# Patient Record
Sex: Female | Born: 1955 | Race: White | Hispanic: No | Smoking: Never smoker
Health system: Southern US, Community
[De-identification: ages and names within clinical notes are randomized; demographics above are authoritative.]

## PROBLEM LIST (undated history)

## (undated) DIAGNOSIS — G589 Mononeuropathy, unspecified: Secondary | ICD-10-CM

## (undated) DIAGNOSIS — K469 Unspecified abdominal hernia without obstruction or gangrene: Secondary | ICD-10-CM

## (undated) DIAGNOSIS — I10 Essential (primary) hypertension: Secondary | ICD-10-CM

## (undated) DIAGNOSIS — K219 Gastro-esophageal reflux disease without esophagitis: Secondary | ICD-10-CM

## (undated) DIAGNOSIS — M1990 Unspecified osteoarthritis, unspecified site: Secondary | ICD-10-CM

## (undated) HISTORY — PX: CHOLECYSTECTOMY: SHX55

## (undated) HISTORY — DX: Mononeuropathy, unspecified: G58.9

---

## 2000-04-01 ENCOUNTER — Encounter: Payer: Self-pay | Admitting: Internal Medicine

## 2000-04-01 ENCOUNTER — Ambulatory Visit (HOSPITAL_COMMUNITY): Admission: RE | Admit: 2000-04-01 | Discharge: 2000-04-01 | Payer: Self-pay | Admitting: Internal Medicine

## 2000-05-25 ENCOUNTER — Other Ambulatory Visit: Admission: RE | Admit: 2000-05-25 | Discharge: 2000-05-25 | Payer: Self-pay | Admitting: Obstetrics and Gynecology

## 2001-06-16 ENCOUNTER — Other Ambulatory Visit: Admission: RE | Admit: 2001-06-16 | Discharge: 2001-06-16 | Payer: Self-pay | Admitting: Obstetrics and Gynecology

## 2002-07-04 ENCOUNTER — Other Ambulatory Visit: Admission: RE | Admit: 2002-07-04 | Discharge: 2002-07-04 | Payer: Self-pay | Admitting: Obstetrics and Gynecology

## 2002-10-01 ENCOUNTER — Encounter: Payer: Self-pay | Admitting: Internal Medicine

## 2002-10-01 ENCOUNTER — Ambulatory Visit (HOSPITAL_COMMUNITY): Admission: RE | Admit: 2002-10-01 | Discharge: 2002-10-01 | Payer: Self-pay | Admitting: Internal Medicine

## 2003-07-17 ENCOUNTER — Other Ambulatory Visit: Admission: RE | Admit: 2003-07-17 | Discharge: 2003-07-17 | Payer: Self-pay | Admitting: Obstetrics and Gynecology

## 2004-07-29 ENCOUNTER — Other Ambulatory Visit: Admission: RE | Admit: 2004-07-29 | Discharge: 2004-07-29 | Payer: Self-pay | Admitting: Obstetrics and Gynecology

## 2005-08-10 ENCOUNTER — Other Ambulatory Visit: Admission: RE | Admit: 2005-08-10 | Discharge: 2005-08-10 | Payer: Self-pay | Admitting: Obstetrics and Gynecology

## 2006-07-06 ENCOUNTER — Ambulatory Visit: Payer: Self-pay | Admitting: Gastroenterology

## 2006-07-19 ENCOUNTER — Ambulatory Visit: Payer: Self-pay | Admitting: Gastroenterology

## 2006-07-19 ENCOUNTER — Encounter (INDEPENDENT_AMBULATORY_CARE_PROVIDER_SITE_OTHER): Payer: Self-pay | Admitting: Gastroenterology

## 2007-08-18 ENCOUNTER — Ambulatory Visit (HOSPITAL_COMMUNITY): Admission: RE | Admit: 2007-08-18 | Discharge: 2007-08-18 | Payer: Self-pay | Admitting: Emergency Medicine

## 2007-08-18 ENCOUNTER — Emergency Department (HOSPITAL_COMMUNITY): Admission: EM | Admit: 2007-08-18 | Discharge: 2007-08-18 | Payer: Self-pay | Admitting: Emergency Medicine

## 2007-11-17 ENCOUNTER — Encounter: Admission: RE | Admit: 2007-11-17 | Discharge: 2007-11-17 | Payer: Self-pay | Admitting: General Surgery

## 2009-04-19 IMAGING — US US ABDOMEN COMPLETE
1 series · 14 of 25 positions shown · non-contrast
Comparison: None available

CLINICAL DATA: Abdominal pain

ABDOMEN ULTRASOUND
TECHNIQUE: Complete abdominal ultrasound examination was performed
including evaluation of the liver, gallbladder, bile ducts,
pancreas, kidneys, spleen, IVC, and abdominal aorta.

[Series 1: unknown · 0.40mm/px · 14 of 99 slices shown]
[im 1/99]
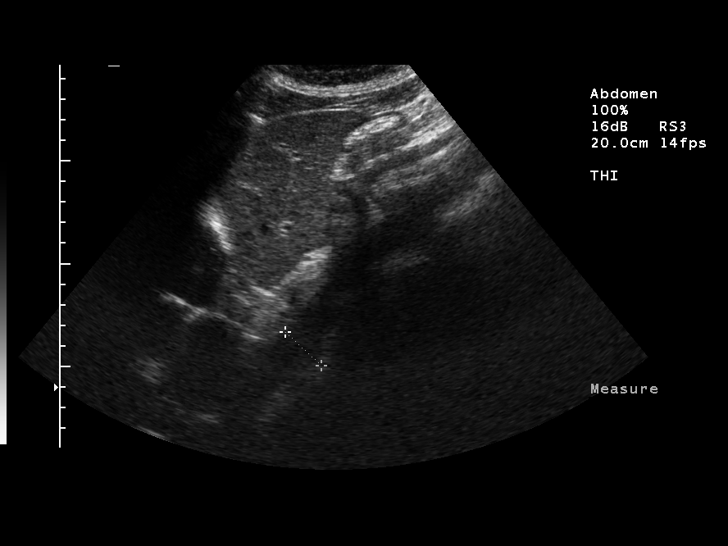
[im 9/99]
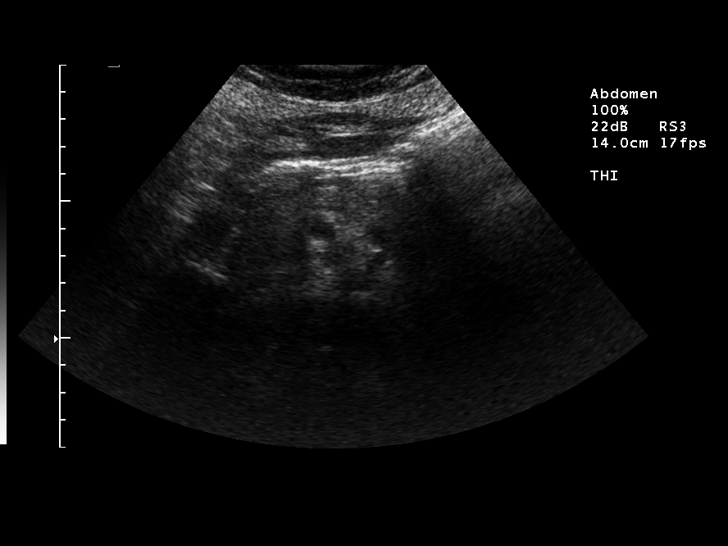
[im 17/99]
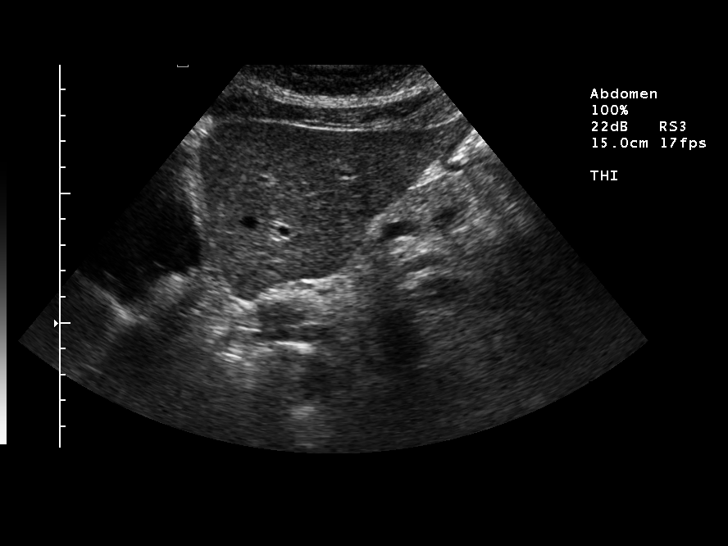
[im 25/99]
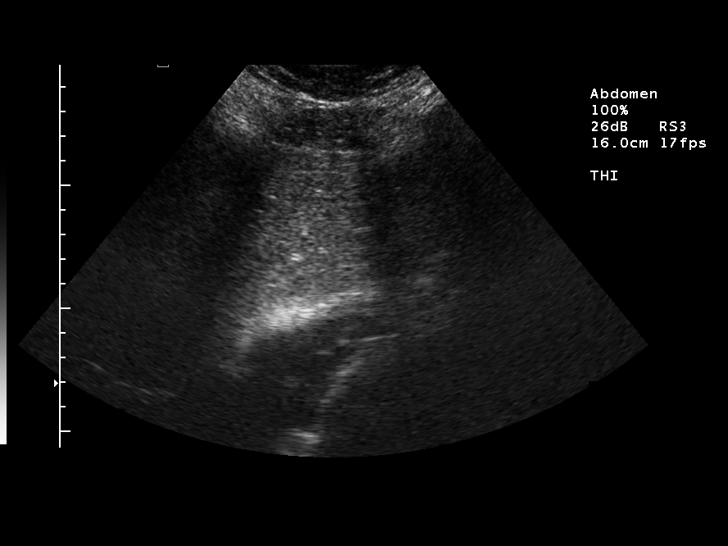
[im 33/99]
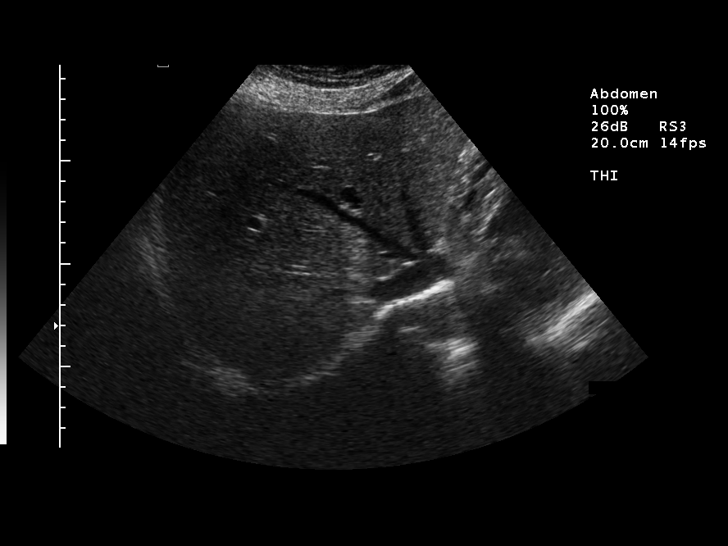
[im 37/99]
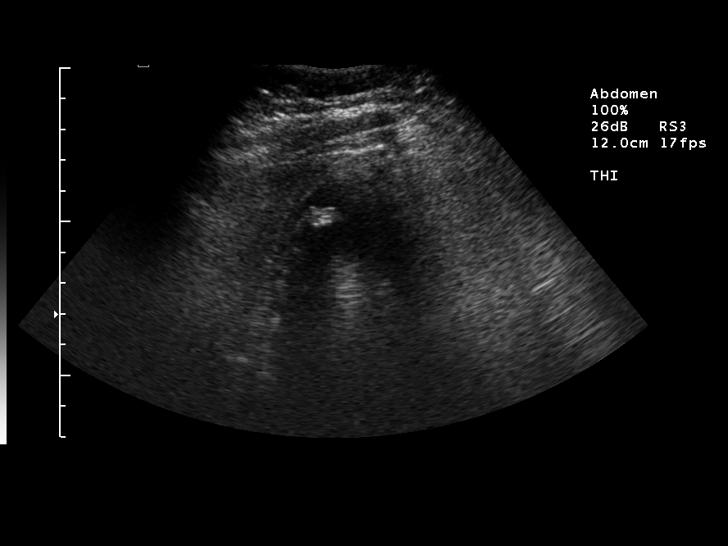
[im 45/99]
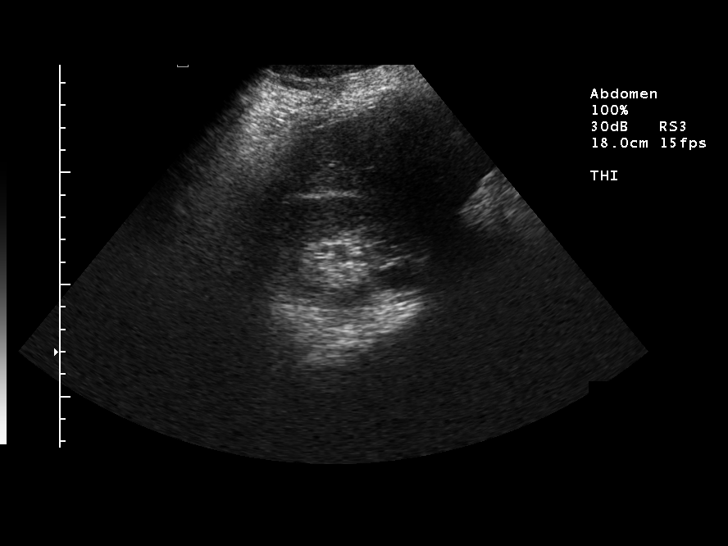
[im 54/99]
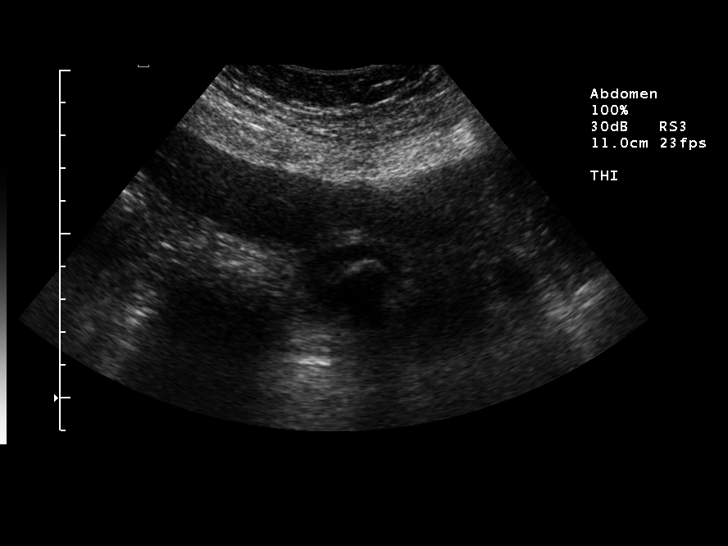
[im 62/99]
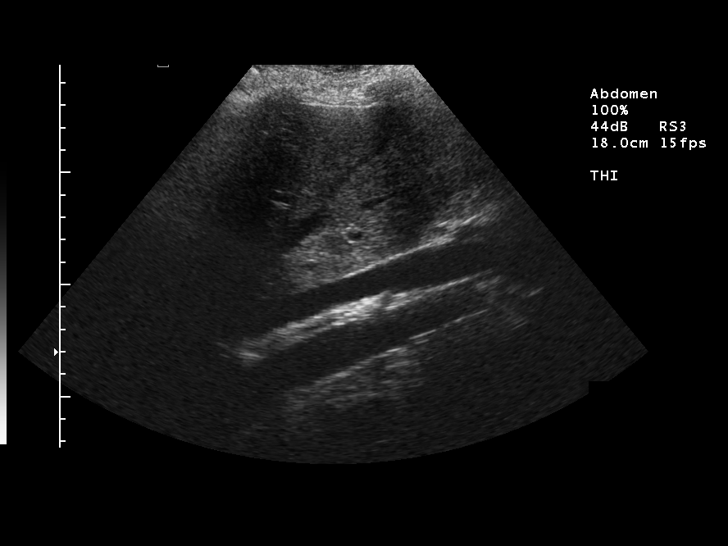
[im 66/99]
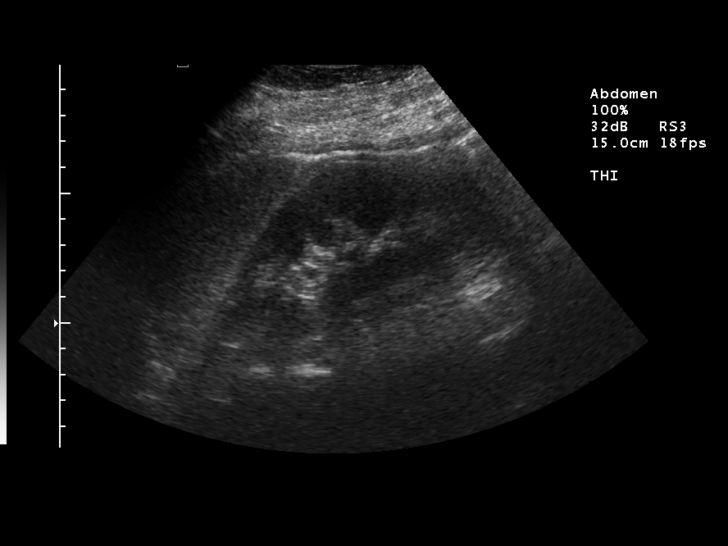
[im 74/99]
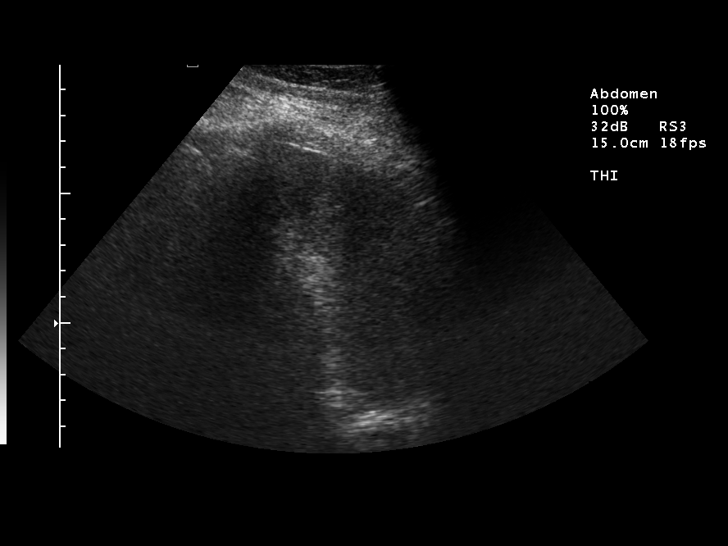
[im 82/99]
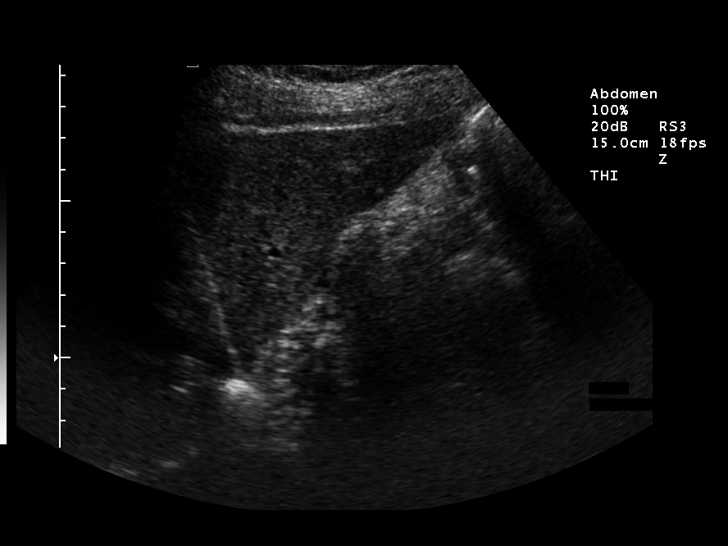
[im 90/99]
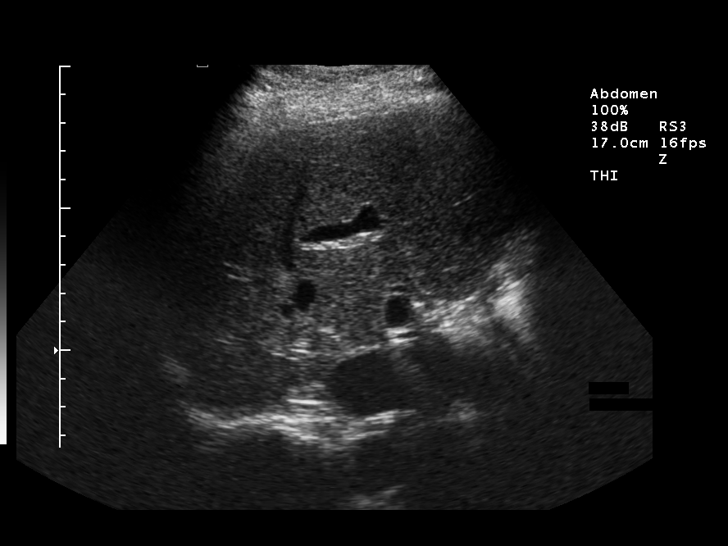
[im 99/99]
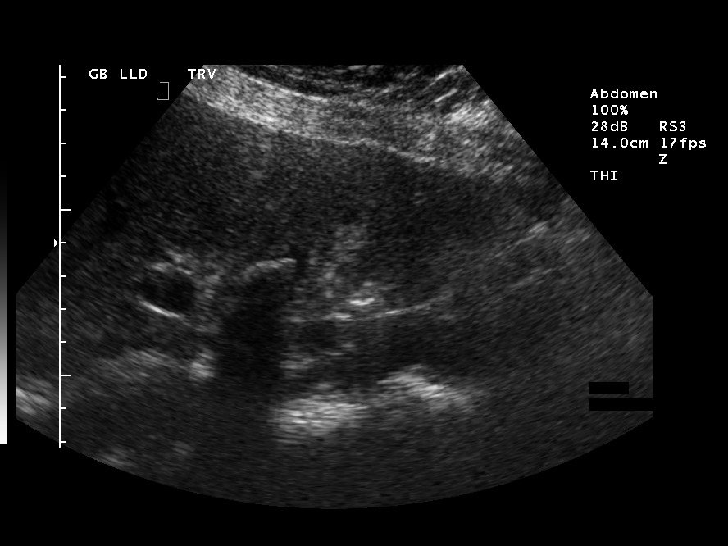

[14 of 25 positions shown; findings below may reference images not displayed]

FINDINGS: Gallbladder:  At least 2 large gallstones in the lumen of the
gallbladder.  Gallbladder wall is thickened to 6 mm.  No definite
pericholecystic fluid.  Negative sonographic Murphy's sign per the
ultrasound technologist.

Common bile duct: Normal in caliber.

Liver:  Normal size and echotexture.  No focal parenchymal
abnormalities.

Inferior vena cava:  Patent.

Pancreas:  Visualized portions unremarkable.

Spleen:  Normal size and echotexture without focal parenchymal
abnormalities.

Right kidney:  No hydronephrosis.   Normal parenchymal echotexture
without focal abnormalities.

Left kidney:  No hydronephrosis. Normal parenchymal echotexture
without focal abnormalities.

Abdominal aorta:  Visualized portions normal in caliber,
unremarkable..
IMPRESSION: 1.  Cholelithiasis with gallbladder wall thickening.

## 2009-10-28 ENCOUNTER — Encounter: Admission: RE | Admit: 2009-10-28 | Discharge: 2010-01-22 | Payer: Self-pay | Admitting: Orthopedic Surgery

## 2009-12-05 ENCOUNTER — Encounter: Admission: RE | Admit: 2009-12-05 | Discharge: 2009-12-05 | Payer: Self-pay | Admitting: Family Medicine

## 2009-12-17 ENCOUNTER — Encounter: Admission: RE | Admit: 2009-12-17 | Discharge: 2009-12-17 | Payer: Self-pay | Admitting: Chiropractic Medicine

## 2009-12-17 ENCOUNTER — Encounter: Admission: RE | Admit: 2009-12-17 | Discharge: 2009-12-17 | Payer: Self-pay | Admitting: Family Medicine

## 2009-12-22 ENCOUNTER — Encounter: Admission: RE | Admit: 2009-12-22 | Discharge: 2009-12-22 | Payer: Self-pay | Admitting: Chiropractic Medicine

## 2010-11-27 ENCOUNTER — Other Ambulatory Visit: Payer: Self-pay | Admitting: Neurosurgery

## 2010-11-27 DIAGNOSIS — M542 Cervicalgia: Secondary | ICD-10-CM

## 2010-11-30 ENCOUNTER — Other Ambulatory Visit: Payer: Self-pay | Admitting: Neurosurgery

## 2010-11-30 DIAGNOSIS — M549 Dorsalgia, unspecified: Secondary | ICD-10-CM

## 2010-11-30 MED ORDER — DIAZEPAM 2 MG PO TABS
10.0000 mg | ORAL_TABLET | Freq: Once | ORAL | Status: AC
  Administered 2010-12-01: 10 mg via ORAL

## 2010-12-01 ENCOUNTER — Ambulatory Visit
Admission: RE | Admit: 2010-12-01 | Discharge: 2010-12-01 | Disposition: A | Discharge status: Home / Self Care | Payer: BC Managed Care – PPO | Source: Ambulatory Visit | Attending: Neurosurgery | Admitting: Neurosurgery

## 2010-12-01 ENCOUNTER — Other Ambulatory Visit: Payer: Self-pay | Admitting: Neurosurgery

## 2010-12-01 DIAGNOSIS — M549 Dorsalgia, unspecified: Secondary | ICD-10-CM

## 2010-12-01 DIAGNOSIS — M542 Cervicalgia: Secondary | ICD-10-CM

## 2010-12-01 MED ORDER — IOHEXOL 300 MG/ML  SOLN
10.0000 mL | Freq: Once | INTRAMUSCULAR | Status: AC | PRN
  Administered 2010-12-01: 10 mL via INTRATHECAL

## 2011-01-19 LAB — URINALYSIS, ROUTINE W REFLEX MICROSCOPIC
Bilirubin Urine: NEGATIVE
Bilirubin Urine: NEGATIVE
Glucose, UA: NEGATIVE
Glucose, UA: NEGATIVE
Leukocytes, UA: NEGATIVE
Leukocytes, UA: NEGATIVE
Nitrite: NEGATIVE
Nitrite: NEGATIVE
Protein, ur: NEGATIVE
Protein, ur: NEGATIVE
Specific Gravity, Urine: 1.028
Specific Gravity, Urine: 1.031 — ABNORMAL HIGH
Urobilinogen, UA: 0.2
Urobilinogen, UA: 0.2
pH: 6
pH: 6

## 2011-01-19 LAB — CBC
HCT: 41.4
Hemoglobin: 14.1
MCHC: 34.2
MCV: 89.5
Platelets: 250
RBC: 4.62
RDW: 12.4
WBC: 7.8

## 2011-01-19 LAB — DIFFERENTIAL
Basophils Absolute: 0
Basophils Relative: 1
Eosinophils Absolute: 0.1
Eosinophils Relative: 1
Lymphocytes Relative: 13
Lymphs Abs: 1
Monocytes Absolute: 0.5
Monocytes Relative: 7
Neutro Abs: 6.1
Neutrophils Relative %: 78 — ABNORMAL HIGH

## 2011-01-19 LAB — URINE MICROSCOPIC-ADD ON

## 2011-01-19 LAB — COMPREHENSIVE METABOLIC PANEL
ALT: 18
AST: 25
Albumin: 3.8
Alkaline Phosphatase: 42
BUN: 10
CO2: 27
Calcium: 9.1
Chloride: 106
Creatinine, Ser: 0.72
GFR calc Af Amer: >60
GFR calc non Af Amer: >60
Glucose, Bld: 107 — ABNORMAL HIGH
Potassium: 3.8
Sodium: 142
Total Bilirubin: 0.5
Total Protein: 6.8

## 2011-01-19 LAB — PREGNANCY, URINE: Preg Test, Ur: NEGATIVE

## 2011-01-19 LAB — URINE CULTURE
Colony Count: NO GROWTH
Culture: NO GROWTH

## 2011-01-19 LAB — LIPASE, BLOOD: Lipase: 49

## 2011-02-08 ENCOUNTER — Ambulatory Visit: Payer: BC Managed Care – PPO | Attending: Neurosurgery | Admitting: Physical Therapy

## 2011-02-08 DIAGNOSIS — R5381 Other malaise: Secondary | ICD-10-CM | POA: Insufficient documentation

## 2011-02-08 DIAGNOSIS — IMO0001 Reserved for inherently not codable concepts without codable children: Secondary | ICD-10-CM | POA: Insufficient documentation

## 2011-02-08 DIAGNOSIS — M545 Low back pain, unspecified: Secondary | ICD-10-CM | POA: Insufficient documentation

## 2011-02-11 ENCOUNTER — Ambulatory Visit: Payer: BC Managed Care – PPO | Admitting: *Deleted

## 2011-02-16 ENCOUNTER — Encounter: Payer: BC Managed Care – PPO | Admitting: Physical Therapy

## 2011-02-17 ENCOUNTER — Ambulatory Visit: Payer: BC Managed Care – PPO | Admitting: Physical Therapy

## 2011-02-24 ENCOUNTER — Ambulatory Visit: Payer: BC Managed Care – PPO | Attending: Neurosurgery | Admitting: Physical Therapy

## 2011-02-24 DIAGNOSIS — R5381 Other malaise: Secondary | ICD-10-CM | POA: Insufficient documentation

## 2011-02-24 DIAGNOSIS — M545 Low back pain, unspecified: Secondary | ICD-10-CM | POA: Insufficient documentation

## 2011-02-24 DIAGNOSIS — IMO0001 Reserved for inherently not codable concepts without codable children: Secondary | ICD-10-CM | POA: Insufficient documentation

## 2011-03-03 ENCOUNTER — Ambulatory Visit: Payer: BC Managed Care – PPO | Attending: Neurosurgery | Admitting: Physical Therapy

## 2011-03-03 DIAGNOSIS — R5381 Other malaise: Secondary | ICD-10-CM | POA: Insufficient documentation

## 2011-03-03 DIAGNOSIS — IMO0001 Reserved for inherently not codable concepts without codable children: Secondary | ICD-10-CM | POA: Insufficient documentation

## 2011-03-03 DIAGNOSIS — M545 Low back pain, unspecified: Secondary | ICD-10-CM | POA: Insufficient documentation

## 2011-03-09 ENCOUNTER — Ambulatory Visit: Payer: BC Managed Care – PPO | Admitting: Physical Therapy

## 2011-03-16 ENCOUNTER — Encounter: Payer: BC Managed Care – PPO | Admitting: Physical Therapy

## 2011-03-17 ENCOUNTER — Ambulatory Visit: Payer: BC Managed Care – PPO | Admitting: Physical Therapy

## 2011-03-23 ENCOUNTER — Ambulatory Visit: Payer: BC Managed Care – PPO | Admitting: *Deleted

## 2011-03-30 ENCOUNTER — Ambulatory Visit: Payer: BC Managed Care – PPO | Attending: Neurosurgery | Admitting: Physical Therapy

## 2011-03-30 DIAGNOSIS — R5381 Other malaise: Secondary | ICD-10-CM | POA: Insufficient documentation

## 2011-03-30 DIAGNOSIS — M545 Low back pain, unspecified: Secondary | ICD-10-CM | POA: Insufficient documentation

## 2011-03-30 DIAGNOSIS — IMO0001 Reserved for inherently not codable concepts without codable children: Secondary | ICD-10-CM | POA: Insufficient documentation

## 2011-04-14 ENCOUNTER — Ambulatory Visit: Payer: BC Managed Care – PPO | Admitting: Physical Therapy

## 2011-05-21 ENCOUNTER — Encounter: Payer: Self-pay | Admitting: Gastroenterology

## 2012-03-03 ENCOUNTER — Encounter: Payer: Self-pay | Admitting: Gastroenterology

## 2012-05-12 ENCOUNTER — Encounter: Payer: Self-pay | Admitting: Gastroenterology

## 2012-06-14 ENCOUNTER — Encounter: Payer: BC Managed Care – PPO | Admitting: Gastroenterology

## 2012-07-26 ENCOUNTER — Ambulatory Visit (AMBULATORY_SURGERY_CENTER): Payer: BC Managed Care – PPO | Admitting: *Deleted

## 2012-07-26 VITALS — Ht 67.0 in | Wt 208.6 lb

## 2012-07-26 DIAGNOSIS — Z800 Family history of malignant neoplasm of digestive organs: Secondary | ICD-10-CM

## 2012-07-26 DIAGNOSIS — Z1211 Encounter for screening for malignant neoplasm of colon: Secondary | ICD-10-CM

## 2012-07-26 DIAGNOSIS — Z86010 Personal history of colonic polyps: Secondary | ICD-10-CM

## 2012-07-26 MED ORDER — MOVIPREP 100 G PO SOLR: ORAL | Status: DC

## 2012-07-27 ENCOUNTER — Encounter: Payer: Self-pay | Admitting: Gastroenterology

## 2012-08-09 ENCOUNTER — Ambulatory Visit (AMBULATORY_SURGERY_CENTER): Payer: BC Managed Care – PPO | Admitting: Gastroenterology

## 2012-08-09 ENCOUNTER — Encounter: Payer: Self-pay | Admitting: Gastroenterology

## 2012-08-09 VITALS — BP 115/90 | HR 64 | Temp 97.9°F | Resp 20 | Ht 67.0 in | Wt 208.0 lb

## 2012-08-09 DIAGNOSIS — Z800 Family history of malignant neoplasm of digestive organs: Secondary | ICD-10-CM

## 2012-08-09 DIAGNOSIS — Z1211 Encounter for screening for malignant neoplasm of colon: Secondary | ICD-10-CM

## 2012-08-09 MED ORDER — SODIUM CHLORIDE 0.9 % IV SOLN: 500.0000 mL | INTRAVENOUS | Status: DC

## 2012-08-09 NOTE — Patient Instructions (Signed)
Normal colon exam.... Repeat colonoscopy in 5 years. Resume current medications. Call us with any questions or concerns. Thank you!   YOU HAD AN ENDOSCOPIC PROCEDURE TODAY AT THE [facility]: Refer to the procedure report that was given to you for any specific questions about what was found during the examination.  If the procedure report does not answer your questions, please call your gastroenterologist to clarify.  If you requested that your care partner not be given the details of your procedure findings, then the procedure report has been included in a sealed envelope for you to review at your convenience later.  YOU SHOULD EXPECT: Some feelings of bloating in the abdomen. Passage of more gas than usual.  Walking can help get rid of the air that was put into your GI tract during the procedure and reduce the bloating. If you had a lower endoscopy (such as a colonoscopy or flexible sigmoidoscopy) you may notice spotting of blood in your stool or on the toilet paper. If you underwent a bowel prep for your procedure, then you may not have a normal bowel movement for a few days.  DIET: Your first meal following the procedure should be a light meal and then it is ok to progress to your normal diet.  A half-sandwich or bowl of soup is an example of a good first meal.  Heavy or fried foods are harder to digest and may make you feel nauseous or bloated.  Likewise meals heavy in dairy and vegetables can cause extra gas to form and this can also increase the bloating.  Drink plenty of fluids but you should avoid alcoholic beverages for 24 hours.  ACTIVITY: Your care partner should take you home directly after the procedure.  You should plan to take it easy, moving slowly for the rest of the day.  You can resume normal activity the day after the procedure however you should NOT DRIVE or use heavy machinery for 24 hours (because of the sedation medicines used during the test).    SYMPTOMS TO REPORT  IMMEDIATELY: A gastroenterologist can be reached at any hour.  During normal business hours, 8:30 AM to 5:00 PM Monday through Friday, call [phone].  After hours and on weekends, please call the GI answering service at [phone] who will take a message and have the physician on call contact you.   Following lower endoscopy (colonoscopy or flexible sigmoidoscopy):  Excessive amounts of blood in the stool  Significant tenderness or worsening of abdominal pains  Swelling of the abdomen that is new, acute  Fever of 100F or higher  Following upper endoscopy (EGD)  Vomiting of blood or coffee ground material  New chest pain or pain under the shoulder blades  Painful or persistently difficult swallowing  New shortness of breath  Fever of 100F or higher  Black, tarry-looking stools  FOLLOW UP: If any biopsies were taken you will be contacted by phone or by letter within the next 1-3 weeks.  Call your gastroenterologist if you have not heard about the biopsies in 3 weeks.  Our staff will call the home number listed on your records the next business day following your procedure to check on you and address any questions or concerns that you may have at that time regarding the information given to you following your procedure. This is a courtesy call and so if there is no answer at the home number and we have not heard from you through the emergency physician  on call, we will assume that you have returned to your regular daily activities without incident.  SIGNATURES/CONFIDENTIALITY: You and/or your care partner have signed paperwork which will be entered into your electronic medical record.  These signatures attest to the fact that that the information above on your After Visit Summary has been reviewed and is understood.  Full responsibility of the confidentiality of this discharge information lies with you and/or your care-partner.

## 2012-08-09 NOTE — Progress Notes (Signed)
Patient did not experience any of the following events: a burn prior to discharge; a fall within the facility; wrong site/side/patient/procedure/implant event; or a hospital transfer or hospital admission upon discharge from the facility. (G8907) Patient did not have preoperative order for IV antibiotic SSI prophylaxis. (G8918)  

## 2012-08-09 NOTE — Op Note (Signed)
[facility] [address] [place], [address]   COLONOSCOPY PROCEDURE REPORT  PATIENT: Toni Francis, Toni Francis  MR#: [id number] BIRTHDATE: 05/04/1955 , 56  yrs. old GENDER: Female ENDOSCOPIST: Mardella Layman, MD, [facility] REFERRED BY: PROCEDURE DATE:  08/09/2012 PROCEDURE:   Colonoscopy, surveillance ASA CLASS:   Class II INDICATIONS:Patient's immediate family history of colon cancer. MEDICATIONS: propofol (Diprivan) 200mg  IV  DESCRIPTION OF PROCEDURE:   After the risks and benefits and of the procedure were explained, informed consent was obtained.  A digital rectal exam revealed no abnormalities of the rectum.    The LB [id number]  endoscope was introduced through the anus and advanced to the cecum, which was identified by both the appendix and ileocecal valve .  The quality of the prep was excellent, using MoviPrep .  The instrument was then slowly withdrawn as the colon was fully examined.     COLON FINDINGS: A normal appearing cecum, ileocecal valve, and appendiceal orifice were identified.  The ascending, hepatic flexure, transverse, splenic flexure, descending, sigmoid colon and rectum appeared unremarkable.  No polyps or cancers were seen. Retroflexed views revealed no abnormalities.     The scope was then withdrawn from the patient and the procedure completed.  COMPLICATIONS: There were no complications. ENDOSCOPIC IMPRESSION: Normal colon ..no polyps or cancer noted....  RECOMMENDATIONS: Given your significant family history of colon cancer, you should have a repeat colonoscopy in 5 years   REPEAT EXAM:  NW:GNFAOZ Christell Constant, MD  _______________________________ eSigned:  Mardella Layman, MD, [facility] 08/09/2012 9:53 AM

## 2012-08-10 ENCOUNTER — Telehealth: Payer: Self-pay | Admitting: *Deleted

## 2012-08-10 NOTE — Telephone Encounter (Signed)
  Follow up Call-  Call back number 08/09/2012  Post procedure Call Back phone  # [phone]  Permission to leave phone message Yes     Patient questions:  Do you have a fever, pain , or abdominal swelling? no Pain Score  0 *  Have you tolerated food without any problems? yes  Have you been able to return to your normal activities? yes  Do you have any questions about your discharge instructions: Diet   no Medications  no Follow up visit  no  Do you have questions or concerns about your Care? no  Actions: * If pain score is 4 or above: No action needed, pain <4.

## 2013-01-03 ENCOUNTER — Telehealth: Payer: Self-pay | Admitting: Family Medicine

## 2013-01-03 ENCOUNTER — Ambulatory Visit (INDEPENDENT_AMBULATORY_CARE_PROVIDER_SITE_OTHER): Payer: BC Managed Care – PPO | Admitting: General Practice

## 2013-01-03 VITALS — BP 113/67 | HR 78 | Temp 97.1°F | Wt 204.0 lb

## 2013-01-03 DIAGNOSIS — J322 Chronic ethmoidal sinusitis: Secondary | ICD-10-CM

## 2013-01-03 MED ORDER — CLARITHROMYCIN 500 MG PO TABS: 500.0000 mg | ORAL_TABLET | Freq: Two times a day (BID) | ORAL | Status: DC

## 2013-01-03 MED ORDER — FLUTICASONE PROPIONATE 50 MCG/ACT NA SUSP: 2.0000 | Freq: Every day | NASAL | Status: DC

## 2013-01-03 NOTE — Progress Notes (Signed)
  Subjective:    Patient ID: Toni Francis, female    DOB: 19-Apr-1956, 57 y.o.   MRN: [id number]  Sinusitis This is a new problem. The current episode started 1 to 4 weeks ago. The problem has been gradually worsening since onset. There has been no fever. Associated symptoms include congestion and sinus pressure. Pertinent negatives include no chills, coughing, headaches, neck pain, shortness of breath or sore throat. Past treatments include nothing.      Review of Systems  Constitutional: Negative for fever and chills.  HENT: Positive for congestion and sinus pressure. Negative for sore throat, neck pain and neck stiffness.   Respiratory: Negative for cough, chest tightness and shortness of breath.   Cardiovascular: Negative for chest pain and palpitations.  Neurological: Negative for dizziness, weakness and headaches.       Objective:   Physical Exam  Constitutional: She is oriented to person, place, and time. She appears well-developed and well-nourished.  HENT:  Head: Normocephalic and atraumatic.  Right Ear: External ear normal.  Left Ear: External ear normal.  Nose: Right sinus exhibits maxillary sinus tenderness and frontal sinus tenderness. Left sinus exhibits maxillary sinus tenderness and frontal sinus tenderness.  Mouth/Throat: Posterior oropharyngeal erythema present.  Cardiovascular: Normal rate, regular rhythm and normal heart sounds.   Pulmonary/Chest: Effort normal and breath sounds normal. No respiratory distress. She exhibits no tenderness.  Neurological: She is alert and oriented to person, place, and time.  Skin: Skin is warm and dry.  Psychiatric: She has a normal mood and affect.          Assessment & Plan:  1. Ethmoid sinusitis - clarithromycin (BIAXIN) 500 MG tablet; Take 1 tablet (500 mg total) by mouth 2 (two) times daily.  Dispense: 20 tablet; Refill: 0 - fluticasone (FLONASE) 50 MCG/ACT nasal spray; Place 2 sprays into the nose daily.  Dispense: 16  g; Refill: 6 -increase fluids -RTO if symptoms worsen or unresolved Patient verbalized understanding Coralie Keens, FNP-C

## 2013-01-03 NOTE — Patient Instructions (Addendum)

## 2013-01-03 NOTE — Telephone Encounter (Signed)
APPT MADE

## 2013-02-08 ENCOUNTER — Encounter (INDEPENDENT_AMBULATORY_CARE_PROVIDER_SITE_OTHER): Payer: Self-pay

## 2013-02-08 ENCOUNTER — Ambulatory Visit (INDEPENDENT_AMBULATORY_CARE_PROVIDER_SITE_OTHER): Payer: BC Managed Care – PPO | Admitting: General Practice

## 2013-02-08 ENCOUNTER — Encounter: Payer: Self-pay | Admitting: General Practice

## 2013-02-08 VITALS — BP 116/70 | HR 72 | Temp 97.3°F | Ht 67.0 in | Wt 207.0 lb

## 2013-02-08 DIAGNOSIS — J329 Chronic sinusitis, unspecified: Secondary | ICD-10-CM

## 2013-02-08 DIAGNOSIS — J019 Acute sinusitis, unspecified: Secondary | ICD-10-CM

## 2013-02-08 DIAGNOSIS — J322 Chronic ethmoidal sinusitis: Secondary | ICD-10-CM

## 2013-02-08 MED ORDER — CEPHALEXIN 500 MG PO CAPS: 500.0000 mg | ORAL_CAPSULE | Freq: Three times a day (TID) | ORAL | Status: DC

## 2013-02-08 NOTE — Patient Instructions (Signed)

## 2013-02-09 NOTE — Progress Notes (Signed)
  Subjective:    Patient ID: Toni Francis, female    DOB: 09/07/1955, 56 y.o.   MRN: [id number]  Sinusitis This is a recurrent problem. The current episode started 1 to 4 weeks ago. The problem has been gradually worsening since onset. There has been no fever. Associated symptoms include congestion and sinus pressure. Pertinent negatives include no chills, coughing, headaches, neck pain, shortness of breath or sore throat. Past treatments include antibiotics.      Review of Systems  Constitutional: Negative for chills.  HENT: Positive for congestion and sinus pressure. Negative for sore throat.   Respiratory: Negative for cough and shortness of breath.   Musculoskeletal: Negative for neck pain.  Neurological: Negative for headaches.       Objective:   Physical Exam  Constitutional: She is oriented to person, place, and time. She appears well-developed and well-nourished.  HENT:  Head: Normocephalic and atraumatic.  Right Ear: External ear normal.  Left Ear: External ear normal.  Nose: Right sinus exhibits maxillary sinus tenderness and frontal sinus tenderness. Left sinus exhibits maxillary sinus tenderness and frontal sinus tenderness.  Mouth/Throat: Posterior oropharyngeal erythema present.  Cardiovascular: Normal rate, regular rhythm and normal heart sounds.   Pulmonary/Chest: Effort normal and breath sounds normal. No respiratory distress. She exhibits no tenderness.  Neurological: She is alert and oriented to person, place, and time.  Skin: Skin is warm and dry.  Psychiatric: She has a normal mood and affect.          Assessment & Plan:  1. Ethmoid sinusitis   2. Sinusitis  - cephALEXin (KEFLEX) 500 MG capsule; Take 1 capsule (500 mg total) by mouth 3 (three) times daily.  Dispense: 30 capsule; Refill: 0 -increase fluid intake -RTO if symptoms worsen or unresolved -Patient verbalized understanding -Coralie Keens, FNP-C

## 2013-03-01 ENCOUNTER — Other Ambulatory Visit: Payer: Self-pay

## 2013-04-23 ENCOUNTER — Other Ambulatory Visit: Payer: Self-pay | Admitting: Family Medicine

## 2013-04-23 ENCOUNTER — Telehealth: Payer: Self-pay | Admitting: Family Medicine

## 2013-04-23 MED ORDER — OSELTAMIVIR PHOSPHATE 75 MG PO CAPS: 75.0000 mg | ORAL_CAPSULE | Freq: Every day | ORAL | Status: DC

## 2013-04-23 NOTE — Telephone Encounter (Signed)
Please confirm that this was called in to [facility] pharmacy. I believe the patient has called on multiple occasions today and a prescription was already called in.

## 2013-04-24 NOTE — Telephone Encounter (Signed)
Patient  said it cost could not afford it

## 2014-01-21 ENCOUNTER — Other Ambulatory Visit: Payer: Self-pay | Admitting: *Deleted

## 2014-01-21 MED ORDER — FLUTICASONE PROPIONATE 50 MCG/ACT NA SUSP: 2.0000 | Freq: Every day | NASAL | Status: DC

## 2014-01-22 ENCOUNTER — Telehealth: Payer: Self-pay | Admitting: Family Medicine

## 2014-01-22 MED ORDER — FLUTICASONE PROPIONATE 50 MCG/ACT NA SUSP: 2.0000 | Freq: Every day | NASAL | Status: DC

## 2014-01-22 NOTE — Telephone Encounter (Signed)
done

## 2014-02-12 ENCOUNTER — Ambulatory Visit (INDEPENDENT_AMBULATORY_CARE_PROVIDER_SITE_OTHER): Payer: BC Managed Care – PPO | Admitting: Family Medicine

## 2014-02-12 VITALS — BP 129/78 | HR 67 | Temp 97.0°F | Ht 67.0 in | Wt 214.2 lb

## 2014-02-12 DIAGNOSIS — M25562 Pain in left knee: Secondary | ICD-10-CM

## 2014-02-12 DIAGNOSIS — S7002XA Contusion of left hip, initial encounter: Secondary | ICD-10-CM

## 2014-02-12 MED ORDER — CYCLOBENZAPRINE HCL 10 MG PO TABS: 10.0000 mg | ORAL_TABLET | Freq: Three times a day (TID) | ORAL | Status: DC | PRN

## 2014-02-12 MED ORDER — NAPROXEN 500 MG PO TABS: 500.0000 mg | ORAL_TABLET | Freq: Two times a day (BID) | ORAL | Status: DC

## 2014-02-12 MED ORDER — KETOROLAC TROMETHAMINE 30 MG/ML IJ SOLN
30.0000 mg | Freq: Once | INTRAMUSCULAR | Status: AC
  Administered 2014-02-12: 30 mg via INTRAMUSCULAR

## 2014-02-12 NOTE — Progress Notes (Signed)
   Subjective:    Patient ID: Toni Francis, female    DOB: 08-31-1955, 58 y.o.   MRN: [id number]  HPI This 58 y.o. female presents for evaluation of left hip and leg discomfort.  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  She states he left leg gave out and she fell.  She is c/o muscle discomfort and cramping in her left calf, thigh, and hip.  She states she took 3 aleve and is still hurting.   She sees neurosurgery and pain management.   Review of Systems C/o left leg and hip cramps No chest pain, SOB, HA, dizziness, vision change, N/V, diarrhea, constipation, dysuria, urinary urgency or frequency or rash.     Objective:   Physical Exam  Vital signs noted  Well developed well nourished female.  HEENT - Head atraumatic Normocephalic                Eyes - PERRLA, Conjuctiva - clear Sclera- Clear EOMI                Ears - EAC's Wnl TM's Wnl Gross Hearing WNL                Nose - Nares patent                 Throat - oropharanx wnl Respiratory - Lungs CTA bilateral Cardiac - RRR S1 and S2 without murmur GI - Abdomen soft Nontender and bowel sounds active x 4 Extremities - No edema. Neuro - Grossly intact.      Assessment & Plan:  Contusion, hip, left, initial encounter - Plan: naproxen (NAPROSYN) 500 MG tablet, cyclobenzaprine (FLEXERIL) 10 MG tablet, ketorolac (TORADOL) 30 MG/ML injection 30 mg  Pain in joint, lower leg, left - Plan: ketorolac (TORADOL) 30 MG/ML injection 30 mg  Lysbeth Penner FNP

## 2014-03-13 ENCOUNTER — Ambulatory Visit (INDEPENDENT_AMBULATORY_CARE_PROVIDER_SITE_OTHER): Payer: BC Managed Care – PPO | Admitting: Family Medicine

## 2014-03-13 ENCOUNTER — Encounter: Payer: Self-pay | Admitting: Family Medicine

## 2014-03-13 VITALS — BP 127/65 | HR 86 | Temp 97.6°F | Wt 216.0 lb

## 2014-03-13 DIAGNOSIS — J0101 Acute recurrent maxillary sinusitis: Secondary | ICD-10-CM

## 2014-03-13 MED ORDER — LEVOFLOXACIN 500 MG PO TABS: 500.0000 mg | ORAL_TABLET | Freq: Every day | ORAL | Status: DC

## 2014-03-13 MED ORDER — METHYLPREDNISOLONE ACETATE 80 MG/ML IJ SUSP
80.0000 mg | Freq: Once | INTRAMUSCULAR | Status: AC
  Administered 2014-03-13: 80 mg via INTRAMUSCULAR

## 2014-03-13 NOTE — Progress Notes (Signed)
   Subjective:    Patient ID: Toni Francis, female    DOB: 03-03-1956, 58 y.o.   MRN: [id number]  HPI C/o facial discomfort and mucopurulent sinus drainage.  She has hx of sinus infections.  She states she is having facial pain and discomfort.  Review of Systems  Constitutional: Negative for fever.  HENT: Negative for ear pain.   Eyes: Negative for discharge.  Respiratory: Negative for cough.   Cardiovascular: Negative for chest pain.  Gastrointestinal: Negative for abdominal distention.  Endocrine: Negative for polyuria.  Genitourinary: Negative for difficulty urinating.  Musculoskeletal: Negative for gait problem and neck pain.  Skin: Negative for color change and rash.  Neurological: Negative for speech difficulty and headaches.  Psychiatric/Behavioral: Negative for agitation.       Objective:    BP 127/65 mmHg  Pulse 86  Temp(Src) 97.6 F (36.4 C) (Oral)  Wt 216 lb (97.977 kg) Physical Exam  Constitutional: She is oriented to person, place, and time. She appears well-developed and well-nourished.  HENT:  Head: Normocephalic and atraumatic.  Mouth/Throat: Oropharynx is clear and moist.  Eyes: Pupils are equal, round, and reactive to light.  Neck: Normal range of motion. Neck supple.  Cardiovascular: Normal rate and regular rhythm.   No murmur heard. Pulmonary/Chest: Effort normal and breath sounds normal.  Abdominal: Soft. Bowel sounds are normal. There is no tenderness.  Neurological: She is alert and oriented to person, place, and time.  Skin: Skin is warm and dry.  Psychiatric: She has a normal mood and affect.          Assessment & Plan:     ICD-9-CM ICD-10-CM   1. Acute recurrent maxillary sinusitis 461.0 J01.01 methylPREDNISolone acetate (DEPO-MEDROL) injection 80 mg     levofloxacin (LEVAQUIN) 500 MG tablet   Push po fluids, rest, tylenol and motrin otc prn as directed for fever, arthralgias, and myalgias.  Follow up prn if sx's continue or  persist.  No Follow-up on file.  Lysbeth Penner FNP

## 2014-03-20 ENCOUNTER — Telehealth: Payer: Self-pay | Admitting: Family Medicine

## 2014-03-20 ENCOUNTER — Other Ambulatory Visit: Payer: Self-pay | Admitting: Family Medicine

## 2014-03-20 NOTE — Telephone Encounter (Signed)
Pt aware.

## 2014-03-20 NOTE — Telephone Encounter (Signed)
done

## 2014-03-28 ENCOUNTER — Other Ambulatory Visit: Payer: Self-pay | Admitting: *Deleted

## 2014-03-28 MED ORDER — FLUTICASONE PROPIONATE 50 MCG/ACT NA SUSP: NASAL | Status: DC

## 2014-03-28 MED ORDER — AMOXICILLIN 500 MG PO CAPS: 500.0000 mg | ORAL_CAPSULE | Freq: Three times a day (TID) | ORAL | Status: DC

## 2014-04-26 LAB — HM MAMMOGRAPHY

## 2014-05-14 ENCOUNTER — Telehealth: Payer: Self-pay | Admitting: *Deleted

## 2014-05-14 MED ORDER — AZELASTINE HCL 0.1 % NA SOLN: 1.0000 | Freq: Two times a day (BID) | NASAL | Status: DC

## 2014-05-14 NOTE — Telephone Encounter (Signed)
Per [facility] ok to send this in for nasal drainage.

## 2014-06-25 ENCOUNTER — Telehealth: Payer: Self-pay | Admitting: Family Medicine

## 2014-06-25 ENCOUNTER — Ambulatory Visit (INDEPENDENT_AMBULATORY_CARE_PROVIDER_SITE_OTHER): Payer: BC Managed Care – PPO | Admitting: Nurse Practitioner

## 2014-06-25 ENCOUNTER — Encounter: Payer: Self-pay | Admitting: Nurse Practitioner

## 2014-06-25 VITALS — BP 117/75 | HR 82 | Temp 97.4°F | Ht 67.0 in | Wt 216.0 lb

## 2014-06-25 DIAGNOSIS — T304 Corrosion of unspecified body region, unspecified degree: Secondary | ICD-10-CM

## 2014-06-25 MED ORDER — MUPIROCIN 2 % EX OINT: 1.0000 "application " | TOPICAL_OINTMENT | Freq: Two times a day (BID) | CUTANEOUS | Status: DC

## 2014-06-25 NOTE — Telephone Encounter (Signed)
Patient states she has appointment worked out
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
## 2014-08-16 ENCOUNTER — Telehealth: Payer: Self-pay | Admitting: Pharmacist

## 2014-08-16 NOTE — Telephone Encounter (Signed)
Patient usually gets this medication from her another physical ("spine doctor") but they are closed today and have not returned her call from 2 days ago.   She states that she has seen Dr Laurance Flatten for her back before and he knows that she takes meloxicam.  Dr Laurance Flatten is not in office today.  I offered patient to send in Rx for #15 meloxicam until her regular physician returns to the office.  Patient wanted a months worth.  I told her I did not feel comfortable doing an Rx for more than #15 because there are no labs to verify her serum creatinine on record here.  She declined prescription for #15.

## 2014-10-14 ENCOUNTER — Other Ambulatory Visit: Payer: Self-pay | Admitting: Family Medicine

## 2014-11-26 ENCOUNTER — Ambulatory Visit (INDEPENDENT_AMBULATORY_CARE_PROVIDER_SITE_OTHER): Payer: BC Managed Care – PPO | Admitting: Family Medicine

## 2014-11-26 ENCOUNTER — Encounter: Payer: Self-pay | Admitting: Family Medicine

## 2014-11-26 VITALS — BP 101/71 | HR 72 | Temp 97.4°F | Ht 67.0 in | Wt 210.8 lb

## 2014-11-26 DIAGNOSIS — H6501 Acute serous otitis media, right ear: Secondary | ICD-10-CM | POA: Diagnosis not present

## 2014-11-26 DIAGNOSIS — Z139 Encounter for screening, unspecified: Secondary | ICD-10-CM

## 2014-11-26 DIAGNOSIS — M5416 Radiculopathy, lumbar region: Secondary | ICD-10-CM | POA: Diagnosis not present

## 2014-11-26 DIAGNOSIS — K219 Gastro-esophageal reflux disease without esophagitis: Secondary | ICD-10-CM | POA: Diagnosis not present

## 2014-11-26 LAB — POCT CBC
Granulocyte percent: 63 %G (ref 37–80)
HCT, POC: 39.6 % (ref 37.7–47.9)
Hemoglobin: 13.2 g/dL (ref 12.2–16.2)
Lymph, poc: 1.3 (ref 0.6–3.4)
MCH, POC: 28.9 pg (ref 27–31.2)
MCHC: 33.3 g/dL (ref 31.8–35.4)
MCV: 86.7 fL (ref 80–97)
MPV: 7.9 fL (ref 0–99.8)
POC Granulocyte: 2.8 (ref 2–6.9)
POC LYMPH PERCENT: 28.9 %L (ref 10–50)
Platelet Count, POC: 199 10*3/uL (ref 142–424)
RBC: 4.57 M/uL (ref 4.04–5.48)
RDW, POC: 12.9 %
WBC: 4.5 10*3/uL — AB (ref 4.6–10.2)

## 2014-11-26 MED ORDER — LEVOFLOXACIN 500 MG PO TABS: 500.0000 mg | ORAL_TABLET | Freq: Every day | ORAL | Status: DC

## 2014-11-26 MED ORDER — ESOMEPRAZOLE MAGNESIUM 40 MG PO CPDR: 40.0000 mg | DELAYED_RELEASE_CAPSULE | Freq: Every day | ORAL | Status: DC

## 2014-11-26 MED ORDER — MELOXICAM 15 MG PO TABS: 15.0000 mg | ORAL_TABLET | Freq: Every day | ORAL | Status: DC

## 2014-11-26 MED ORDER — FLUTICASONE PROPIONATE 50 MCG/ACT NA SUSP: NASAL | Status: DC

## 2014-11-26 NOTE — Progress Notes (Signed)
Subjective:  Patient ID: Toni Francis, female    DOB: 12/17/1955  Age: 59 y.o. MRN: [id number]  CC: Gastrophageal Reflux and Back Pain   HPI Toni Francis presents for pain in hips and legs radiating from lower back. Using mobic. Had muscle release with chiro.Marland KitchenHas inflammation.   R ear pain present for a few days. Getting worse. Not causing hearing loss. No fever. Moderate UR congestion.  Patient in for follow-up of GERD. Currently asymptomatic taking  PPI daily. There is no chest pain or heartburn. No hematemesis and no melena. No dysphagia or choking. Onset is remote. Progression is stable. Complicating factors, none.   History Toni Francis has a past medical history of Pinched nerve.   Toni Francis has past surgical history that includes Cholecystectomy.   Toni Francis family history includes Colon cancer (age of onset: 30) in Toni Francis maternal uncle.Toni Francis reports that Toni Francis has never smoked. Toni Francis has never used smokeless tobacco. Toni Francis reports that Toni Francis does not drink alcohol or use illicit drugs.  Outpatient Prescriptions Prior to Visit  Medication Sig Dispense Refill  . Multiple Vitamins-Minerals (CENTRUM SILVER PO) Take 1 tablet by mouth daily.    . Multiple Vitamins-Minerals (VISION FORMULA PO) Take 1 tablet by mouth daily.    . Omega-3 Fatty Acids (FISH OIL PO) Take 1,400 mg by mouth daily.    . fluticasone (FLONASE) 50 MCG/ACT nasal spray USE TWO SPRAY(S) IN EACH NOSTRIL ONCE DAILY 16 g 11  . cyclobenzaprine (FLEXERIL) 10 MG tablet Take 1 tablet (10 mg total) by mouth 3 (three) times daily as needed for muscle spasms. (Patient not taking: Reported on 11/26/2014) 30 tablet 0  . Ibuprofen (ADVIL) 200 MG CAPS Take 2 capsules by mouth as needed.    Marland Kitchen azelastine (ASTELIN) 0.1 % nasal spray Place 1 spray into both nostrils 2 (two) times daily. Use in each nostril as directed (Patient not taking: Reported on 11/26/2014) 30 mL 12  . Fiber CHEW Chew 2 tablets by mouth 2 (two) times daily.    . fluticasone (FLONASE) 50  MCG/ACT nasal spray USE TWO SPRAY(S) IN EACH NOSTRIL ONCE DAILY (Patient not taking: Reported on 11/26/2014) 16 g 1  . mupirocin ointment (BACTROBAN) 2 % Place 1 application into the nose 2 (two) times daily. (Patient not taking: Reported on 11/26/2014) 22 g 0  . naproxen (NAPROSYN) 500 MG tablet Take 1 tablet (500 mg total) by mouth 2 (two) times daily with a meal. (Patient not taking: Reported on 11/26/2014) 30 tablet 0   No facility-administered medications prior to visit.    ROS Review of Systems  Constitutional: Negative for fever, chills, diaphoresis, appetite change, fatigue and unexpected weight change.  HENT: Positive for congestion, ear pain and postnasal drip. Negative for hearing loss, rhinorrhea, sneezing, sore throat and trouble swallowing.   Eyes: Negative for pain.  Respiratory: Negative for cough, chest tightness and shortness of breath.   Cardiovascular: Negative for chest pain and palpitations.  Gastrointestinal: Negative for nausea, vomiting, abdominal pain, diarrhea and constipation.  Genitourinary: Negative for dysuria, frequency, difficulty urinating and menstrual problem.  Musculoskeletal: Positive for back pain and arthralgias. Negative for joint swelling.  Skin: Negative for rash.  Neurological: Negative for dizziness, weakness, numbness and headaches.  Psychiatric/Behavioral: Negative for dysphoric mood and agitation.    Objective:  BP 101/71 mmHg  Pulse 72  Temp(Src) 97.4 F (36.3 C) (Oral)  Ht '5\' 7"'  (1.702 m)  Wt 210 lb 12.8 oz (95.618 kg)  BMI 33.01 kg/m2  BP Readings  from Last 3 Encounters:  11/26/14 101/71  06/25/14 117/75  03/13/14 127/65    Wt Readings from Last 3 Encounters:  11/26/14 210 lb 12.8 oz (95.618 kg)  06/25/14 216 lb (97.977 kg)  03/13/14 216 lb (97.977 kg)     Physical Exam  Constitutional: Toni Francis is oriented to person, place, and time. Toni Francis appears well-developed and well-nourished. No distress.  HENT:  Head: Normocephalic and  atraumatic.  Right Ear: External ear normal.  Left Ear: External ear normal.  Nose: Nose normal.  Mouth/Throat: Oropharynx is clear and moist.  Eyes: Conjunctivae and EOM are normal. Pupils are equal, round, and reactive to light.  Neck: Normal range of motion. Neck supple. No thyromegaly present.  Cardiovascular: Normal rate, regular rhythm and normal heart sounds.   No murmur heard. Pulmonary/Chest: Effort normal and breath sounds normal. No respiratory distress. Toni Francis has no wheezes. Toni Francis has no rales.  Abdominal: Soft. Bowel sounds are normal. Toni Francis exhibits no distension. There is no tenderness.  Lymphadenopathy:    Toni Francis has no cervical adenopathy.  Neurological: Toni Francis is alert and oriented to person, place, and time. Toni Francis has normal reflexes.  Skin: Skin is warm and dry.  Psychiatric: Toni Francis has a normal mood and affect. Toni Francis behavior is normal. Judgment and thought content normal.    No results found for: HGBA1C  Lab Results  Component Value Date   WBC 4.5* 11/26/2014   HGB 13.2 11/26/2014   HCT 39.6 11/26/2014   PLT 250 08/18/2007   GLUCOSE 107* 08/18/2007   ALT 18 08/18/2007   AST 25 08/18/2007   NA 142 08/18/2007   K 3.8 08/18/2007   CL 106 08/18/2007   CREATININE 0.72 08/18/2007   BUN 10 08/18/2007   CO2 27 08/18/2007    Ct Cervical Spine W Contrast  12/01/2010   *RADIOLOGY REPORT*  Clinical Data:  Neck and back pain.  Pain in both legs with tingling sensation to the feet.  Parasthesias in the arms, predominately involving the left index finger.  The upper extremity symptoms have improved following chiropractic intervention.  MYELOGRAM INJECTION  Technique:  Informed consent was obtained from the patient prior to the procedure, including potential complications of headache, allergy, infection and pain.  A timeout procedure was performed. With the patient prone, the lower back was prepped with Betadine. 1% Lidocaine was used for local anesthesia.  Lumbar puncture was performed at  the right paramidline L2-3 level using a 22 gauge needle with return of clear CSF.  10 ml of Omnipaque 300was injected into the subarachnoid space .  IMPRESSION: Successful injection of  intrathecal contrast for myelography.  MYELOGRAM CERVICAL AND LUMBAR  Technique:  Following injection of intrathecal Omnipaque contrast, spine imaging in multiple projections was performed using fluoroscopy.  Fluoroscopy Time: 1.54 minutes.  Comparison:  MRI of the cervical and lumbar spine 12/05/2009.  Findings: S-shaped curvature of the lumbar spine is convex to the right at L2 and to the left at L5.  The lumbar nerve roots fill normally on both sides.  Mild disc bulging is evident at L4-5 the upper lumbar spine is not well opacified on the conventional images.  Imaging of the cervical spine demonstrates normal filling of the nerve roots bilaterally.  A disc osteophyte complex is present at C5-6 and C6-7 with some ventral impression on the thecal sac.  The upright images of the lumbar spine demonstrate no significant change in alignment or disc disease.  There is no abnormal motion with flexion or extension.  IMPRESSION:  1.  Mild S-shaped curvature of the lumbar spine without focal stenosis. 2.  Mild disc osteophyte complex at C5-6 and C6-7 without significant stenosis.  *RADIOLOGY REPORT*  CT MYELOGRAPHY CERVICAL SPINE  Technique:  CT imaging of the cervical spine was performed after intrathecal contrast administration. Multiplanar CT image reconstructions were also generated.  Findings:  Mild rightward curvature is evident in the cervical spine.  This curvature was not evident on the plain films.  The spine is imaged from the skull base through T3.  The vertebral body heights and alignment are maintained.  There is some straightening of the normal cervical lordosis.  The disc levels at C4-5 and above are normal.  C5-6:  A mild disc osteophyte complex partially effaces the ventral CSF.  The foramina are patent bilaterally.   C6-7:  A mild disc osteophyte complex partially effaces the ventral CSF.  The foramina are patent bilaterally.  C7-T1:  Negative.  Mild heterogeneous enlargement of the thyroid is noted without a focal lesion.  IMPRESSION:  1.  Mild disc osteophyte complex at C5-6 and C6-7 with partial effacement of the ventral CSF at both levels but no significant stenosis. 2.  Heterogeneous enlargement of the thyroid gland likely represents a goiter without a dominant lesion. 3.  Mild straightening of the normal cervical lordosis.  This is nonspecific and may be positional.  It can be seen in the setting of ongoing pain or muscle strain.  *RADIOLOGY REPORT*  CT MYELOGRAPHY LUMBAR SPINE  Technique:  CT imaging of the lumbar spine was performed after intrathecal contrast administration.  Multiplanar CT image reconstructions were also generated.  Findings:  The lumbar spine is imaged from midbody of T12-S2.  The vertebral body heights and alignment maintained.  Mild S-shaped curvature of the lumbar spine is convex to the left at L5 and to the right at L2-3.  There is rotatory component to this curvature. The conus medullaris terminates at L1-T12, within normal limits.  A nonobstructing 3.5 mm stone is present in the midportion of the right kidney.  Minimal atherosclerotic calcification is evident in the aorta without aneurysm.  The individual disc levels are as follows.  T12-L1:  Negative.  L1-2:  Mild facet hypertrophy is present.  There is minimal disc bulging.  No significant stenosis is evident.  L2-3: Mild facet hypertrophy is present bilaterally.  There is no significant disc herniation or stenosis.  L3-4:  Mild facet hypertrophy is present bilaterally.  There is no significant disc herniation or stenosis.  L4-5:  Broad-based disc bulging is present.  Moderate facet hypertrophy is evident.  Mild lateral recess narrowing worse on the right.  Minimal foraminal encroachment is seen bilaterally as before.  L5-S1:  Asymmetric  right-sided facet hypertrophy is associated with the scoliosis.  There is some spurring.  Right-sided endplate osteophyte formation is also present with endplate sclerotic change and vacuum disc.  This results in mild right foraminal stenosis. There is some slight narrowing of the right lateral recess as well.  IMPRESSION:  1.  Mild lateral recess narrowing at L4-5 is worse on the right. 2.  Minimal foraminal encroachment bilaterally at L4-5. 3.  Mild right lateral recess and foraminal stenosis at L5-S1.  Original Report Authenticated By: Resa Miner. MATTERN, M.D.  Ct Lumbar Spine W Contrast  12/01/2010   *RADIOLOGY REPORT*  Clinical Data:  Neck and back pain.  Pain in both legs with tingling sensation to the feet.  Parasthesias in the arms, predominately involving the left  index finger.  The upper extremity symptoms have improved following chiropractic intervention.  MYELOGRAM INJECTION  Technique:  Informed consent was obtained from the patient prior to the procedure, including potential complications of headache, allergy, infection and pain.  A timeout procedure was performed. With the patient prone, the lower back was prepped with Betadine. 1% Lidocaine was used for local anesthesia.  Lumbar puncture was performed at the right paramidline L2-3 level using a 22 gauge needle with return of clear CSF.  10 ml of Omnipaque 300was injected into the subarachnoid space .  IMPRESSION: Successful injection of  intrathecal contrast for myelography.  MYELOGRAM CERVICAL AND LUMBAR  Technique:  Following injection of intrathecal Omnipaque contrast, spine imaging in multiple projections was performed using fluoroscopy.  Fluoroscopy Time: 1.54 minutes.  Comparison:  MRI of the cervical and lumbar spine 12/05/2009.  Findings: S-shaped curvature of the lumbar spine is convex to the right at L2 and to the left at L5.  The lumbar nerve roots fill normally on both sides.  Mild disc bulging is evident at L4-5 the upper lumbar  spine is not well opacified on the conventional images.  Imaging of the cervical spine demonstrates normal filling of the nerve roots bilaterally.  A disc osteophyte complex is present at C5-6 and C6-7 with some ventral impression on the thecal sac.  The upright images of the lumbar spine demonstrate no significant change in alignment or disc disease.  There is no abnormal motion with flexion or extension.  IMPRESSION:  1.  Mild S-shaped curvature of the lumbar spine without focal stenosis. 2.  Mild disc osteophyte complex at C5-6 and C6-7 without significant stenosis.  *RADIOLOGY REPORT*  CT MYELOGRAPHY CERVICAL SPINE  Technique:  CT imaging of the cervical spine was performed after intrathecal contrast administration. Multiplanar CT image reconstructions were also generated.  Findings:  Mild rightward curvature is evident in the cervical spine.  This curvature was not evident on the plain films.  The spine is imaged from the skull base through T3.  The vertebral body heights and alignment are maintained.  There is some straightening of the normal cervical lordosis.  The disc levels at C4-5 and above are normal.  C5-6:  A mild disc osteophyte complex partially effaces the ventral CSF.  The foramina are patent bilaterally.  C6-7:  A mild disc osteophyte complex partially effaces the ventral CSF.  The foramina are patent bilaterally.  C7-T1:  Negative.  Mild heterogeneous enlargement of the thyroid is noted without a focal lesion.  IMPRESSION:  1.  Mild disc osteophyte complex at C5-6 and C6-7 with partial effacement of the ventral CSF at both levels but no significant stenosis. 2.  Heterogeneous enlargement of the thyroid gland likely represents a goiter without a dominant lesion. 3.  Mild straightening of the normal cervical lordosis.  This is nonspecific and may be positional.  It can be seen in the setting of ongoing pain or muscle strain.  *RADIOLOGY REPORT*  CT MYELOGRAPHY LUMBAR SPINE  Technique:  CT imaging  of the lumbar spine was performed after intrathecal contrast administration.  Multiplanar CT image reconstructions were also generated.  Findings:  The lumbar spine is imaged from midbody of T12-S2.  The vertebral body heights and alignment maintained.  Mild S-shaped curvature of the lumbar spine is convex to the left at L5 and to the right at L2-3.  There is rotatory component to this curvature. The conus medullaris terminates at L1-T12, within normal limits.  A nonobstructing 3.5 mm  stone is present in the midportion of the right kidney.  Minimal atherosclerotic calcification is evident in the aorta without aneurysm.  The individual disc levels are as follows.  T12-L1:  Negative.  L1-2:  Mild facet hypertrophy is present.  There is minimal disc bulging.  No significant stenosis is evident.  L2-3: Mild facet hypertrophy is present bilaterally.  There is no significant disc herniation or stenosis.  L3-4:  Mild facet hypertrophy is present bilaterally.  There is no significant disc herniation or stenosis.  L4-5:  Broad-based disc bulging is present.  Moderate facet hypertrophy is evident.  Mild lateral recess narrowing worse on the right.  Minimal foraminal encroachment is seen bilaterally as before.  L5-S1:  Asymmetric right-sided facet hypertrophy is associated with the scoliosis.  There is some spurring.  Right-sided endplate osteophyte formation is also present with endplate sclerotic change and vacuum disc.  This results in mild right foraminal stenosis. There is some slight narrowing of the right lateral recess as well.  IMPRESSION:  1.  Mild lateral recess narrowing at L4-5 is worse on the right. 2.  Minimal foraminal encroachment bilaterally at L4-5. 3.  Mild right lateral recess and foraminal stenosis at L5-S1.  Original Report Authenticated By: Resa Miner. MATTERN, M.D.  Dg Myelogram 2+ Regions  12/01/2010   *RADIOLOGY REPORT*  Clinical Data:  Neck and back pain.  Pain in both legs with tingling  sensation to the feet.  Parasthesias in the arms, predominately involving the left index finger.  The upper extremity symptoms have improved following chiropractic intervention.  MYELOGRAM INJECTION  Technique:  Informed consent was obtained from the patient prior to the procedure, including potential complications of headache, allergy, infection and pain.  A timeout procedure was performed. With the patient prone, the lower back was prepped with Betadine. 1% Lidocaine was used for local anesthesia.  Lumbar puncture was performed at the right paramidline L2-3 level using a 22 gauge needle with return of clear CSF.  10 ml of Omnipaque 300was injected into the subarachnoid space .  IMPRESSION: Successful injection of  intrathecal contrast for myelography.  MYELOGRAM CERVICAL AND LUMBAR  Technique:  Following injection of intrathecal Omnipaque contrast, spine imaging in multiple projections was performed using fluoroscopy.  Fluoroscopy Time: 1.54 minutes.  Comparison:  MRI of the cervical and lumbar spine 12/05/2009.  Findings: S-shaped curvature of the lumbar spine is convex to the right at L2 and to the left at L5.  The lumbar nerve roots fill normally on both sides.  Mild disc bulging is evident at L4-5 the upper lumbar spine is not well opacified on the conventional images.  Imaging of the cervical spine demonstrates normal filling of the nerve roots bilaterally.  A disc osteophyte complex is present at C5-6 and C6-7 with some ventral impression on the thecal sac.  The upright images of the lumbar spine demonstrate no significant change in alignment or disc disease.  There is no abnormal motion with flexion or extension.  IMPRESSION:  1.  Mild S-shaped curvature of the lumbar spine without focal stenosis. 2.  Mild disc osteophyte complex at C5-6 and C6-7 without significant stenosis.  *RADIOLOGY REPORT*  CT MYELOGRAPHY CERVICAL SPINE  Technique:  CT imaging of the cervical spine was performed after intrathecal  contrast administration. Multiplanar CT image reconstructions were also generated.  Findings:  Mild rightward curvature is evident in the cervical spine.  This curvature was not evident on the plain films.  The spine is imaged from the skull  base through T3.  The vertebral body heights and alignment are maintained.  There is some straightening of the normal cervical lordosis.  The disc levels at C4-5 and above are normal.  C5-6:  A mild disc osteophyte complex partially effaces the ventral CSF.  The foramina are patent bilaterally.  C6-7:  A mild disc osteophyte complex partially effaces the ventral CSF.  The foramina are patent bilaterally.  C7-T1:  Negative.  Mild heterogeneous enlargement of the thyroid is noted without a focal lesion.  IMPRESSION:  1.  Mild disc osteophyte complex at C5-6 and C6-7 with partial effacement of the ventral CSF at both levels but no significant stenosis. 2.  Heterogeneous enlargement of the thyroid gland likely represents a goiter without a dominant lesion. 3.  Mild straightening of the normal cervical lordosis.  This is nonspecific and may be positional.  It can be seen in the setting of ongoing pain or muscle strain.  *RADIOLOGY REPORT*  CT MYELOGRAPHY LUMBAR SPINE  Technique:  CT imaging of the lumbar spine was performed after intrathecal contrast administration.  Multiplanar CT image reconstructions were also generated.  Findings:  The lumbar spine is imaged from midbody of T12-S2.  The vertebral body heights and alignment maintained.  Mild S-shaped curvature of the lumbar spine is convex to the left at L5 and to the right at L2-3.  There is rotatory component to this curvature. The conus medullaris terminates at L1-T12, within normal limits.  A nonobstructing 3.5 mm stone is present in the midportion of the right kidney.  Minimal atherosclerotic calcification is evident in the aorta without aneurysm.  The individual disc levels are as follows.  T12-L1:  Negative.  L1-2:  Mild  facet hypertrophy is present.  There is minimal disc bulging.  No significant stenosis is evident.  L2-3: Mild facet hypertrophy is present bilaterally.  There is no significant disc herniation or stenosis.  L3-4:  Mild facet hypertrophy is present bilaterally.  There is no significant disc herniation or stenosis.  L4-5:  Broad-based disc bulging is present.  Moderate facet hypertrophy is evident.  Mild lateral recess narrowing worse on the right.  Minimal foraminal encroachment is seen bilaterally as before.  L5-S1:  Asymmetric right-sided facet hypertrophy is associated with the scoliosis.  There is some spurring.  Right-sided endplate osteophyte formation is also present with endplate sclerotic change and vacuum disc.  This results in mild right foraminal stenosis. There is some slight narrowing of the right lateral recess as well.  IMPRESSION:  1.  Mild lateral recess narrowing at L4-5 is worse on the right. 2.  Minimal foraminal encroachment bilaterally at L4-5. 3.  Mild right lateral recess and foraminal stenosis at L5-S1.  Original Report Authenticated By: Resa Miner. MATTERN, M.D.   Assessment & Plan:   Toni Francis was seen today for gastrophageal reflux and back pain.  Diagnoses and all orders for this visit:  Lumbar radiculopathy  Screening Orders: -     POCT CBC -     CMP14+EGFR -     Lipid panel -     TSH -     Vit D  25 hydroxy (rtn osteoporosis monitoring)  Right acute serous otitis media, recurrence not specified  Gastroesophageal reflux disease without esophagitis  Other orders -     esomeprazole (NEXIUM) 40 MG capsule; Take 1 capsule (40 mg total) by mouth daily. -     fluticasone (FLONASE) 50 MCG/ACT nasal spray; USE TWO SPRAY(S) IN EACH NOSTRIL ONCE DAILY -  meloxicam (MOBIC) 15 MG tablet; Take 1 tablet (15 mg total) by mouth daily. -     levofloxacin (LEVAQUIN) 500 MG tablet; Take 1 tablet (500 mg total) by mouth daily.   I have discontinued Toni Francis Fiber,  naproxen, azelastine, and mupirocin ointment. I have also changed Toni Francis esomeprazole and meloxicam. Additionally, I am having Toni Francis start on levofloxacin. Lastly, I am having Toni Francis maintain Toni Francis Multiple Vitamins-Minerals (CENTRUM SILVER PO), Multiple Vitamins-Minerals (VISION FORMULA PO), Omega-3 Fatty Acids (FISH OIL PO), Ibuprofen, cyclobenzaprine, and fluticasone.  Meds ordered this encounter  Medications  . DISCONTD: meloxicam (MOBIC) 15 MG tablet    Sig: Take 1 tablet by mouth daily.  Marland Kitchen DISCONTD: esomeprazole (NEXIUM) 40 MG capsule    Sig: Take 40 mg by mouth daily at 12 noon.  Marland Kitchen esomeprazole (NEXIUM) 40 MG capsule    Sig: Take 1 capsule (40 mg total) by mouth daily.    Dispense:  30 capsule    Refill:  11  . fluticasone (FLONASE) 50 MCG/ACT nasal spray    Sig: USE TWO SPRAY(S) IN EACH NOSTRIL ONCE DAILY    Dispense:  16 g    Refill:  11  . meloxicam (MOBIC) 15 MG tablet    Sig: Take 1 tablet (15 mg total) by mouth daily.    Dispense:  30 tablet    Refill:  5  . levofloxacin (LEVAQUIN) 500 MG tablet    Sig: Take 1 tablet (500 mg total) by mouth daily.    Dispense:  10 tablet    Refill:  0     Follow-up: Return in about 6 months (around 05/29/2015).  Claretta Fraise, M.D.

## 2014-11-26 NOTE — Patient Instructions (Signed)
Take calcium 1200 mg daily as well as vitamin D 2000 units daily

## 2014-11-27 LAB — LIPID PANEL
Chol/HDL Ratio: 2.9 ratio units (ref 0.0–4.4)
Cholesterol, Total: 179 mg/dL (ref 100–199)
HDL: 61 mg/dL (ref >39)
LDL Calculated: 96 mg/dL (ref 0–99)
Triglycerides: 112 mg/dL (ref 0–149)
VLDL Cholesterol Cal: 22 mg/dL (ref 5–40)

## 2014-11-27 LAB — CMP14+EGFR
ALT: 23 IU/L (ref 0–32)
AST: 25 IU/L (ref 0–40)
Albumin/Globulin Ratio: 1.8 (ref 1.1–2.5)
Albumin: 4.2 g/dL (ref 3.5–5.5)
Alkaline Phosphatase: 83 IU/L (ref 39–117)
BUN/Creatinine Ratio: 25 — ABNORMAL HIGH (ref 9–23)
BUN: 17 mg/dL (ref 6–24)
Bilirubin Total: 0.4 mg/dL (ref 0.0–1.2)
CO2: 24 mmol/L (ref 18–29)
Calcium: 9.5 mg/dL (ref 8.7–10.2)
Chloride: 102 mmol/L (ref 97–108)
Creatinine, Ser: 0.68 mg/dL (ref 0.57–1.00)
GFR calc Af Amer: 112 mL/min/{1.73_m2} (ref >59)
GFR calc non Af Amer: 97 mL/min/{1.73_m2} (ref >59)
Globulin, Total: 2.3 g/dL (ref 1.5–4.5)
Glucose: 81 mg/dL (ref 65–99)
Potassium: 4.4 mmol/L (ref 3.5–5.2)
Sodium: 142 mmol/L (ref 134–144)
Total Protein: 6.5 g/dL (ref 6.0–8.5)

## 2014-11-27 LAB — TSH: TSH: 3.51 u[IU]/mL (ref 0.450–4.500)

## 2014-11-27 LAB — VITAMIN D 25 HYDROXY (VIT D DEFICIENCY, FRACTURES): Vit D, 25-Hydroxy: 40.6 ng/mL (ref 30.0–100.0)

## 2014-11-29 ENCOUNTER — Encounter: Payer: Self-pay | Admitting: *Deleted

## 2015-02-11 ENCOUNTER — Telehealth: Payer: Self-pay | Admitting: Family Medicine

## 2015-02-25 ENCOUNTER — Encounter: Payer: Self-pay | Admitting: Family Medicine

## 2015-02-25 ENCOUNTER — Ambulatory Visit (INDEPENDENT_AMBULATORY_CARE_PROVIDER_SITE_OTHER): Payer: BC Managed Care – PPO | Admitting: Family Medicine

## 2015-02-25 VITALS — BP 125/82 | HR 71 | Temp 97.8°F | Ht 67.0 in | Wt 216.0 lb

## 2015-02-25 DIAGNOSIS — S3092XA Unspecified superficial injury of abdominal wall, initial encounter: Secondary | ICD-10-CM

## 2015-02-25 DIAGNOSIS — W57XXXA Bitten or stung by nonvenomous insect and other nonvenomous arthropods, initial encounter: Secondary | ICD-10-CM

## 2015-02-25 DIAGNOSIS — L299 Pruritus, unspecified: Secondary | ICD-10-CM | POA: Diagnosis not present

## 2015-02-25 MED ORDER — PREDNISONE 10 MG (21) PO TBPK: 10.0000 mg | ORAL_TABLET | Freq: Every day | ORAL | Status: DC

## 2015-02-25 MED ORDER — PERMETHRIN 5 % EX CREA: 1.0000 "application " | TOPICAL_CREAM | Freq: Once | CUTANEOUS | Status: DC

## 2015-02-25 NOTE — Patient Instructions (Signed)
Use elimite cream/lotion as directed and reapply this one time in 1 week  Take prednisone as directed starting tomorrow in a tapering dose  Use soap detergent and fabric softener that is scent free  prescriptions for the cream and the prednisone were called in to the pharmacy when I was talking to the pharmacist

## 2015-02-25 NOTE — Progress Notes (Signed)
Subjective:    Patient ID: Toni Francis, female    DOB: 09-10-1955, 59 y.o.   MRN: [id number]  HPI   Patient here today for bug bites after handling straw two weeks ago. She feels like things are crawling all over her. She showed me one bite on her abdomen. She says her much more prominent after she applies over-the-counter cream/lotion called try,. In talking to the pharmacist this is an herbal remedy that has antihistamine in it. She also indicates that one of her grandchildren had bites on them after they spent time with her in her home.     There are no active problems to display for this patient.  Outpatient Encounter Prescriptions as of 02/25/2015  Medication Sig  . esomeprazole (NEXIUM) 40 MG capsule Take 1 capsule (40 mg total) by mouth daily.  . fluticasone (FLONASE) 50 MCG/ACT nasal spray USE TWO SPRAY(S) IN EACH NOSTRIL ONCE DAILY  . Multiple Vitamins-Minerals (CENTRUM SILVER PO) Take 1 tablet by mouth daily.  . Multiple Vitamins-Minerals (VISION FORMULA PO) Take 1 tablet by mouth daily.  . Omega-3 Fatty Acids (FISH OIL PO) Take 1,400 mg by mouth daily.  . cyclobenzaprine (FLEXERIL) 10 MG tablet Take 1 tablet (10 mg total) by mouth 3 (three) times daily as needed for muscle spasms. (Patient not taking: Reported on 11/26/2014)  . meloxicam (MOBIC) 15 MG tablet Take 1 tablet (15 mg total) by mouth daily. (Patient not taking: Reported on 02/25/2015)  . [DISCONTINUED] Ibuprofen (ADVIL) 200 MG CAPS Take 2 capsules by mouth as needed.  . [DISCONTINUED] levofloxacin (LEVAQUIN) 500 MG tablet Take 1 tablet (500 mg total) by mouth daily.   No facility-administered encounter medications on file as of 02/25/2015.      Review of Systems  Constitutional: Negative.   HENT: Negative.   Eyes: Negative.   Respiratory: Negative.   Cardiovascular: Negative.   Gastrointestinal: Negative.   Endocrine: Negative.   Genitourinary: Negative.   Musculoskeletal: Negative.   Skin: Negative.    Bug bites after handling straw two weeks ago.  Allergic/Immunologic: Negative.   Neurological: Negative.   Hematological: Negative.   Psychiatric/Behavioral: Negative.        Objective:   Physical Exam  Constitutional: She is oriented to person, place, and time. She appears well-developed and well-nourished. No distress.  Eyes: Conjunctivae and EOM are normal. Pupils are equal, round, and reactive to light. Right eye exhibits no discharge. Left eye exhibits no discharge. No scleral icterus.  Neck: Normal range of motion.  Musculoskeletal: Normal range of motion. She exhibits no edema.  Neurological: She is alert and oriented to person, place, and time.  Skin: Skin is warm and dry. Rash noted. No erythema. No pallor.  Dry skin. Only one bite is present on the abdomen. No visible mites are seen.  Psychiatric: She has a normal mood and affect. Her behavior is normal. Judgment and thought content normal.  Nursing note and vitals reviewed.  BP 125/82 mmHg  Pulse 71  Temp(Src) 97.8 F (36.6 C) (Oral)  Ht 5\' 7"  (1.702 m)  Wt 216 lb (97.977 kg)  BMI 33.82 kg/m2        Assessment & Plan:  1. Insect bites -Use prescribed cream as directed and take prednisone starting tomorrow  2. Pruritus -As above  Meds ordered this encounter  Medications  . permethrin (ELIMITE) 5 % cream    Sig: Apply 1 application topically once.    Dispense:  60 g    Refill:  0  . predniSONE (STERAPRED UNI-PAK 21 TAB) 10 MG (21) TBPK tablet    Sig: Take 1 tablet (10 mg total) by mouth daily. Take 1 tab QID for 2 days, then 1 tab TID for 2 days, then 1 tab BID for 2 days, then 1 tab QD for 2 days then stop    Dispense:  20 tablet    Refill:  0     Patient Instructions   Use elimite cream/lotion as directed and reapply this one time in 1 week  Take prednisone as directed starting tomorrow in a tapering dose  Use soap detergent and fabric softener that is scent free  prescriptions for the cream and the  prednisone were called in to the pharmacy when I was talking to the pharmacist   Arrie Senate MD

## 2015-03-26 ENCOUNTER — Telehealth: Payer: Self-pay | Admitting: Family Medicine

## 2015-03-26 NOTE — Telephone Encounter (Signed)
She does not do flu shots

## 2015-04-08 ENCOUNTER — Telehealth: Payer: Self-pay | Admitting: Family Medicine

## 2015-04-08 MED ORDER — PREDNISONE 10 MG (21) PO TBPK: 10.0000 mg | ORAL_TABLET | Freq: Every day | ORAL | Status: DC

## 2015-04-08 MED ORDER — PERMETHRIN 5 % EX CREA: 1.0000 "application " | TOPICAL_CREAM | Freq: Once | CUTANEOUS | Status: DC

## 2015-04-08 NOTE — Telephone Encounter (Signed)
Take prednisone 10 Taper.2 day Taper.

## 2015-04-08 NOTE — Telephone Encounter (Signed)
Please advise and route to Pool A 

## 2015-04-21 ENCOUNTER — Other Ambulatory Visit: Payer: Self-pay | Admitting: Family

## 2015-04-21 MED ORDER — AMOXICILLIN-POT CLAVULANATE 875-125 MG PO TABS: 1.0000 | ORAL_TABLET | Freq: Two times a day (BID) | ORAL | Status: DC

## 2015-05-07 ENCOUNTER — Encounter: Payer: Self-pay | Admitting: Family Medicine

## 2015-05-07 ENCOUNTER — Ambulatory Visit (INDEPENDENT_AMBULATORY_CARE_PROVIDER_SITE_OTHER): Payer: BC Managed Care – PPO | Admitting: Family Medicine

## 2015-05-07 VITALS — BP 135/79 | HR 78 | Temp 98.5°F | Ht 67.0 in | Wt 225.0 lb

## 2015-05-07 DIAGNOSIS — J0101 Acute recurrent maxillary sinusitis: Secondary | ICD-10-CM | POA: Diagnosis not present

## 2015-05-07 DIAGNOSIS — H6501 Acute serous otitis media, right ear: Secondary | ICD-10-CM

## 2015-05-07 MED ORDER — LEVOFLOXACIN 500 MG PO TABS: 500.0000 mg | ORAL_TABLET | Freq: Every day | ORAL | Status: DC

## 2015-05-07 MED ORDER — PSEUDOEPHEDRINE-GUAIFENESIN ER 120-1200 MG PO TB12: 1.0000 | ORAL_TABLET | Freq: Two times a day (BID) | ORAL | Status: DC

## 2015-05-07 MED ORDER — BETAMETHASONE SOD PHOS & ACET 6 (3-3) MG/ML IJ SUSP
6.0000 mg | Freq: Once | INTRAMUSCULAR | Status: AC
  Administered 2015-05-07: 6 mg via INTRAMUSCULAR

## 2015-05-07 NOTE — Progress Notes (Signed)
Subjective:  Patient ID: Toni Francis, female    DOB: [birth year]  Age: 60 y.o. MRN: [id number]  CC: Sinusitis   HPI Toni Francis presents for Patient presents with upper respiratory congestion. Rhinorrhea that is frequently purulent. There is moderate sore throat. Patient reports coughing frequently as well. There are no fever no chills & no sweats with exception of one night right after onset. The patient denies being short of breath. Onset was 12 days ago. Gradually worsening in spite of home remedies including mucinex. She also took an  entire bottle of amoxil. Has frontal HA as well as pain at right cheek   History Toni Francis has a past medical history of Pinched nerve.   She has past surgical history that includes Cholecystectomy.   Her family history includes Colon cancer (age of onset: 18) in her maternal uncle.She reports that she has never smoked. She has never used smokeless tobacco. She reports that she does not drink alcohol or use illicit drugs.  Current Outpatient Prescriptions on File Prior to Visit  Medication Sig Dispense Refill  . esomeprazole (NEXIUM) 40 MG capsule Take 1 capsule (40 mg total) by mouth daily. 30 capsule 11  . fluticasone (FLONASE) 50 MCG/ACT nasal spray USE TWO SPRAY(S) IN EACH NOSTRIL ONCE DAILY 16 g 11  . Multiple Vitamins-Minerals (CENTRUM SILVER PO) Take 1 tablet by mouth daily.    . Multiple Vitamins-Minerals (VISION FORMULA PO) Take 1 tablet by mouth daily.    . Omega-3 Fatty Acids (FISH OIL PO) Take 1,400 mg by mouth daily.    . cyclobenzaprine (FLEXERIL) 10 MG tablet Take 1 tablet (10 mg total) by mouth 3 (three) times daily as needed for muscle spasms. (Patient not taking: Reported on 11/26/2014) 30 tablet 0  . meloxicam (MOBIC) 15 MG tablet Take 1 tablet (15 mg total) by mouth daily. (Patient not taking: Reported on 02/25/2015) 30 tablet 5   No current facility-administered medications on file prior to visit.    ROS Review of Systems    Constitutional: Negative for fever, chills, activity change and appetite change.  HENT: Positive for congestion, ear pain (right), postnasal drip, rhinorrhea and sinus pressure. Negative for ear discharge, hearing loss, nosebleeds, sneezing and trouble swallowing.   Respiratory: Negative for chest tightness and shortness of breath.   Cardiovascular: Negative for chest pain and palpitations.  Skin: Negative for rash.  Neurological: Positive for headaches (frontal).    Objective:  BP 135/79 mmHg  Pulse 78  Temp(Src) 98.5 F (36.9 C) (Oral)  Ht 5\' 7"  (1.702 m)  Wt 225 lb (102.059 kg)  BMI 35.23 kg/m2  SpO2 98%  Physical Exam  Constitutional: She appears well-developed and well-nourished.  HENT:  Head: Normocephalic and atraumatic.  Right Ear: Tympanic membrane and external ear normal. No decreased hearing is noted.  Left Ear: Tympanic membrane and external ear normal. No decreased hearing is noted.  Nose: Mucosal edema, rhinorrhea and sinus tenderness present. Right sinus exhibits maxillary sinus tenderness and frontal sinus tenderness. Left sinus exhibits frontal sinus tenderness. Left sinus exhibits no maxillary sinus tenderness.  Mouth/Throat: No oropharyngeal exudate or posterior oropharyngeal erythema.  Neck: No Brudzinski's sign noted.  Pulmonary/Chest: Breath sounds normal. No respiratory distress.  Lymphadenopathy:       Head (right side): No preauricular adenopathy present.       Head (left side): No preauricular adenopathy present.       Right cervical: No superficial cervical adenopathy present.      Left cervical:  No superficial cervical adenopathy present.    Assessment & Plan:   Dasya was seen today for sinusitis.  Diagnoses and all orders for this visit:  Right acute serous otitis media, recurrence not specified -     betamethasone acetate-betamethasone sodium phosphate (CELESTONE) injection 6 mg; Inject 1 mL (6 mg total) into the muscle once.  Acute  recurrent maxillary sinusitis -     betamethasone acetate-betamethasone sodium phosphate (CELESTONE) injection 6 mg; Inject 1 mL (6 mg total) into the muscle once.  Other orders -     levofloxacin (LEVAQUIN) 500 MG tablet; Take 1 tablet (500 mg total) by mouth daily. -     Pseudoephedrine-Guaifenesin [phone] MG TB12; Take 1 tablet by mouth 2 (two) times daily. For congestion  I have discontinued Ms. Cusimano predniSONE, permethrin, and amoxicillin-clavulanate. I am also having her start on levofloxacin and Pseudoephedrine-Guaifenesin. Additionally, I am having her maintain her Multiple Vitamins-Minerals (CENTRUM SILVER PO), Multiple Vitamins-Minerals (VISION FORMULA PO), Omega-3 Fatty Acids (FISH OIL PO), cyclobenzaprine, esomeprazole, fluticasone, and meloxicam. We will continue to administer betamethasone acetate-betamethasone sodium phosphate.  Meds ordered this encounter  Medications  . betamethasone acetate-betamethasone sodium phosphate (CELESTONE) injection 6 mg    Sig:   . levofloxacin (LEVAQUIN) 500 MG tablet    Sig: Take 1 tablet (500 mg total) by mouth daily.    Dispense:  10 tablet    Refill:  0  . Pseudoephedrine-Guaifenesin [phone] MG TB12    Sig: Take 1 tablet by mouth 2 (two) times daily. For congestion    Dispense:  20 each    Refill:  0     Follow-up: No Follow-up on file.  Toni Francis, M.D.

## 2015-05-09 ENCOUNTER — Ambulatory Visit: Payer: BC Managed Care – PPO | Admitting: Family Medicine

## 2015-06-20 ENCOUNTER — Ambulatory Visit (INDEPENDENT_AMBULATORY_CARE_PROVIDER_SITE_OTHER): Payer: BC Managed Care – PPO | Admitting: Nurse Practitioner

## 2015-06-20 ENCOUNTER — Encounter: Payer: Self-pay | Admitting: Nurse Practitioner

## 2015-06-20 VITALS — BP 134/87 | HR 75 | Temp 97.1°F | Ht 67.0 in | Wt 216.4 lb

## 2015-06-20 DIAGNOSIS — J0101 Acute recurrent maxillary sinusitis: Secondary | ICD-10-CM | POA: Diagnosis not present

## 2015-06-20 MED ORDER — DOXYCYCLINE HYCLATE 100 MG PO TABS: 100.0000 mg | ORAL_TABLET | Freq: Two times a day (BID) | ORAL | Status: DC

## 2015-06-20 NOTE — Progress Notes (Signed)
   Subjective:    Patient ID: Toni Francis, female    DOB: 06/02/1955, 60 y.o.   MRN: [id number]  HPI Patient in c/o cough and congestion that started about 2 weeks ago- was intermittent to start with but now it will not go away- has taken alkaseltzer cold meds OTC and has not helped.    Review of Systems  Constitutional: Negative for fever, chills and fatigue.  HENT: Positive for congestion, ear pain, sinus pressure, sore throat and voice change.   Respiratory: Positive for cough.   Cardiovascular: Negative.   Gastrointestinal: Negative.   Genitourinary: Negative.   Musculoskeletal: Negative.   Neurological: Negative.   Psychiatric/Behavioral: Negative.   All other systems reviewed and are negative.      Objective:   Physical Exam  Constitutional: She is oriented to person, place, and time. She appears well-developed and well-nourished.  HENT:  Right Ear: Hearing, tympanic membrane, external ear and ear canal normal.  Left Ear: Hearing, tympanic membrane, external ear and ear canal normal.  Nose: Mucosal edema and rhinorrhea present. Right sinus exhibits maxillary sinus tenderness. Right sinus exhibits no frontal sinus tenderness. Left sinus exhibits maxillary sinus tenderness. Left sinus exhibits no frontal sinus tenderness.  Mouth/Throat: Uvula is midline, oropharynx is clear and moist and mucous membranes are normal.  Eyes: Pupils are equal, round, and reactive to light.  Neck: Normal range of motion. Neck supple.  Cardiovascular: Normal rate, regular rhythm and normal heart sounds.   Pulmonary/Chest: Effort normal and breath sounds normal.  Neurological: She is alert and oriented to person, place, and time.  Skin: Skin is warm and dry.  Psychiatric: She has a normal mood and affect. Her behavior is normal. Thought content normal.    BP 134/87 mmHg  Pulse 75  Temp(Src) 97.1 F (36.2 C) (Oral)  Ht 5\' 7"  (1.702 m)  Wt 216 lb 6.4 oz (98.158 kg)  BMI 33.88 kg/m2      Assessment & Plan:  1. Acute recurrent maxillary sinusitis 1. Take meds as prescribed 2. Use a cool mist humidifier especially during the winter months and when heat has been humid. 3. Use saline nose sprays frequently 4. Saline irrigations of the nose can be very helpful if done frequently.  * 4X daily for 1 week*  * Use of a nettie pot can be helpful with this. Follow directions with this* 5. Drink plenty of fluids 6. Keep thermostat turn down low 7.For any cough or congestion  Use plain Mucinex- regular strength or max strength is fine   * Children- consult with Pharmacist for dosing 8. For fever or aces or pains- take tylenol or ibuprofen appropriate for age and weight.  * for fevers greater than 101 orally you may alternate ibuprofen and tylenol every  3 hours.   Meds ordered this encounter  Medications  . doxycycline (VIBRA-TABS) 100 MG tablet    Sig: Take 1 tablet (100 mg total) by mouth 2 (two) times daily. 1 po bid    Dispense:  20 tablet    Refill:  0    Order Specific Question:  Supervising Provider    Answer:  Chipper Herb [[id number]]   Mary-Margaret Hassell Done, FNP

## 2015-06-24 ENCOUNTER — Telehealth: Payer: Self-pay | Admitting: *Deleted

## 2015-06-24 MED ORDER — AZITHROMYCIN 250 MG PO TABS: ORAL_TABLET | ORAL | Status: DC

## 2015-06-24 NOTE — Telephone Encounter (Signed)
stopp doxy- z pak rx sent to pharmacy

## 2015-06-24 NOTE — Telephone Encounter (Signed)
Patient aware.

## 2015-09-19 ENCOUNTER — Encounter: Payer: Self-pay | Admitting: Family Medicine

## 2015-09-19 ENCOUNTER — Ambulatory Visit (INDEPENDENT_AMBULATORY_CARE_PROVIDER_SITE_OTHER): Payer: BC Managed Care – PPO | Admitting: Family Medicine

## 2015-09-19 VITALS — BP 136/85 | HR 88 | Temp 98.3°F | Ht 67.0 in | Wt 222.4 lb

## 2015-09-19 DIAGNOSIS — J0101 Acute recurrent maxillary sinusitis: Secondary | ICD-10-CM

## 2015-09-19 MED ORDER — LEVOFLOXACIN 500 MG PO TABS: 500.0000 mg | ORAL_TABLET | Freq: Every day | ORAL | Status: DC

## 2015-09-19 MED ORDER — BETAMETHASONE SOD PHOS & ACET 6 (3-3) MG/ML IJ SUSP
6.0000 mg | Freq: Once | INTRAMUSCULAR | Status: AC
  Administered 2015-09-19: 6 mg via INTRAMUSCULAR

## 2015-09-19 NOTE — Progress Notes (Signed)
Subjective:  Patient ID: Toni Francis, female    DOB: 1955/10/14  Age: 60 y.o. MRN: [id number]  CC: Sinusitis   HPI Toni Francis presents for Symptoms include congestion, facial pain, nasal congestion, no  fever, yellow productive cough, post nasal drip and sinus pressure with no fever, chills, night sweats or weight loss. Onset of symptoms was a few days ago, gradually worsening since that time. Pt.is drinking moderate amounts of fluids.      History Toni Francis has a past medical history of Pinched nerve.   Toni Francis has past surgical history that includes Cholecystectomy.   Toni Francis family history includes Colon cancer (age of onset: 65) in Toni Francis maternal uncle.Toni Francis reports that Toni Francis has never smoked. Toni Francis has never used smokeless tobacco. Toni Francis reports that Toni Francis does not drink alcohol or use illicit drugs.    ROS Review of Systems  Constitutional: Negative for fever, chills, activity change and appetite change.  HENT: Positive for congestion, postnasal drip, rhinorrhea and sinus pressure. Negative for ear discharge, ear pain, hearing loss, nosebleeds, sneezing and trouble swallowing.   Respiratory: Positive for cough (with yellow sputum). Negative for chest tightness and shortness of breath.   Cardiovascular: Negative for chest pain and palpitations.  Skin: Negative for rash.    Objective:  BP 136/85 mmHg  Pulse 88  Temp(Src) 98.3 F (36.8 C) (Oral)  Ht 5\' 7"  (1.702 m)  Wt 222 lb 6 oz (100.869 kg)  BMI 34.82 kg/m2  BP Readings from Last 3 Encounters:  09/19/15 136/85  06/20/15 134/87  05/07/15 135/79    Wt Readings from Last 3 Encounters:  09/19/15 222 lb 6 oz (100.869 kg)  06/20/15 216 lb 6.4 oz (98.158 kg)  05/07/15 225 lb (102.059 kg)     Physical Exam  Constitutional: Toni Francis appears well-developed and well-nourished.  HENT:  Head: Normocephalic and atraumatic.  Right Ear: Tympanic membrane and external ear normal. No decreased hearing is noted.  Left Ear: Tympanic membrane  and external ear normal. No decreased hearing is noted.  Nose: Mucosal edema present. Right sinus exhibits no maxillary sinus tenderness and no frontal sinus tenderness. Left sinus exhibits no frontal sinus tenderness.  Mouth/Throat: No oropharyngeal exudate or posterior oropharyngeal erythema.  Neck: No Brudzinski's sign noted.  Pulmonary/Chest: Breath sounds normal. No respiratory distress.  Lymphadenopathy:       Head (right side): No preauricular adenopathy present.       Head (left side): No preauricular adenopathy present.       Right cervical: No superficial cervical adenopathy present.      Left cervical: No superficial cervical adenopathy present.       Assessment & Plan:   Toni Francis was seen today for sinusitis.  Diagnoses and all orders for this visit:  Acute recurrent maxillary sinusitis -     betamethasone acetate-betamethasone sodium phosphate (CELESTONE) injection 6 mg; Inject 1 mL (6 mg total) into the muscle once.  Other orders -     levofloxacin (LEVAQUIN) 500 MG tablet; Take 1 tablet (500 mg total) by mouth daily. For 10 days      Toni Francis have discontinued Toni Francis doxycycline and azithromycin. Toni Francis am also having Toni Francis start on levofloxacin. Additionally, Toni Francis am having Toni Francis maintain Toni Francis Multiple Vitamins-Minerals (CENTRUM SILVER PO), Multiple Vitamins-Minerals (VISION FORMULA PO), Omega-3 Fatty Acids (FISH OIL PO), cyclobenzaprine, esomeprazole, fluticasone, and meloxicam. We will continue to administer betamethasone acetate-betamethasone sodium phosphate.  Meds ordered this encounter  Medications  . levofloxacin (LEVAQUIN) 500 MG tablet  Sig: Take 1 tablet (500 mg total) by mouth daily. For 10 days    Dispense:  10 tablet    Refill:  0  . betamethasone acetate-betamethasone sodium phosphate (CELESTONE) injection 6 mg    Sig:      Follow-up: Return if symptoms worsen or fail to improve.  Toni Francis, M.D.

## 2015-09-29 ENCOUNTER — Telehealth: Payer: Self-pay | Admitting: Family Medicine

## 2015-09-29 MED ORDER — MELOXICAM 15 MG PO TABS: 15.0000 mg | ORAL_TABLET | Freq: Every day | ORAL | Status: DC

## 2015-09-29 NOTE — Telephone Encounter (Signed)
Patient called requesting refill on meloxicam

## 2015-09-29 NOTE — Telephone Encounter (Signed)
The requested med has been sent to the pharmacy.  Please let the patient know. Thanks, WS 

## 2015-09-29 NOTE — Telephone Encounter (Signed)
I sent in the requested prescription 

## 2015-09-30 NOTE — Telephone Encounter (Signed)
Pt notified of refill

## 2015-10-06 ENCOUNTER — Telehealth: Payer: Self-pay | Admitting: Family Medicine

## 2015-10-06 NOTE — Telephone Encounter (Signed)
Pt given appt with [facility] 6/20 at 2:30.

## 2015-10-14 ENCOUNTER — Encounter: Payer: Self-pay | Admitting: Family Medicine

## 2015-10-14 ENCOUNTER — Ambulatory Visit (INDEPENDENT_AMBULATORY_CARE_PROVIDER_SITE_OTHER): Payer: BC Managed Care – PPO | Admitting: Family Medicine

## 2015-10-14 VITALS — BP 121/80 | HR 85 | Temp 98.9°F | Ht 67.0 in | Wt 226.0 lb

## 2015-10-14 DIAGNOSIS — J301 Allergic rhinitis due to pollen: Secondary | ICD-10-CM

## 2015-10-14 DIAGNOSIS — J309 Allergic rhinitis, unspecified: Secondary | ICD-10-CM | POA: Insufficient documentation

## 2015-10-14 DIAGNOSIS — H6123 Impacted cerumen, bilateral: Secondary | ICD-10-CM | POA: Diagnosis not present

## 2015-10-14 MED ORDER — FLUTICASONE PROPIONATE 50 MCG/ACT NA SUSP: NASAL | Status: DC

## 2015-10-14 NOTE — Progress Notes (Signed)
Subjective:    Patient ID: Toni Francis, female    DOB: October 07, 1955, 60 y.o.   MRN: [id number]  HPI Patient is here today for possible inner ear and loosing her balance. Patient states that she is getting ready to go to [place] and wanted to have it checked before leaving. The patient has had some head congestion and drainage and occasional dizziness. This is been going on for a while.   Review of Systems  Constitutional: Negative.   HENT: Positive for ear pain.        Pressure in Right ear.   Eyes: Negative.   Respiratory: Negative.   Cardiovascular: Negative.   Gastrointestinal: Negative.   Endocrine: Negative.   Genitourinary: Negative.   Musculoskeletal: Negative.   Skin: Negative.   Allergic/Immunologic: Negative.   Neurological: Negative.   Hematological: Negative.   Psychiatric/Behavioral: Negative.           There are no active problems to display for this patient.  Outpatient Encounter Prescriptions as of 10/14/2015  Medication Sig  . cyclobenzaprine (FLEXERIL) 10 MG tablet Take 1 tablet (10 mg total) by mouth 3 (three) times daily as needed for muscle spasms.  Marland Kitchen esomeprazole (NEXIUM) 40 MG capsule Take 1 capsule (40 mg total) by mouth daily.  . fluticasone (FLONASE) 50 MCG/ACT nasal spray USE TWO SPRAY(S) IN EACH NOSTRIL ONCE DAILY  . meloxicam (MOBIC) 15 MG tablet Take 1 tablet (15 mg total) by mouth daily.  . Multiple Vitamins-Minerals (CENTRUM SILVER PO) Take 1 tablet by mouth daily.  . Omega-3 Fatty Acids (FISH OIL PO) Take 1,400 mg by mouth daily.  . [DISCONTINUED] levofloxacin (LEVAQUIN) 500 MG tablet Take 1 tablet (500 mg total) by mouth daily. For 10 days  . [DISCONTINUED] Multiple Vitamins-Minerals (VISION FORMULA PO) Take 1 tablet by mouth daily.   No facility-administered encounter medications on file as of 10/14/2015.        Objective:   Physical Exam  Constitutional: She is oriented to person, place, and time. She appears well-developed and  well-nourished. No distress.  HENT:  Head: Normocephalic and atraumatic.  Left Ear: External ear normal.  Mouth/Throat: Oropharynx is clear and moist. No oropharyngeal exudate.  Some nasal turbinate congestion pallor bilaterally Ears cerumen removed from right ear canal with curet  Eyes: Conjunctivae and EOM are normal. Pupils are equal, round, and reactive to light. Right eye exhibits no discharge. Left eye exhibits no discharge. No scleral icterus.  Neck: Normal range of motion. Neck supple. No thyromegaly present.  Cardiovascular: Normal rate, regular rhythm and normal heart sounds.   No murmur heard. Pulmonary/Chest: Effort normal and breath sounds normal. No respiratory distress. She has no wheezes. She has no rales. She exhibits no tenderness.  Musculoskeletal: Normal range of motion.  Lymphadenopathy:    She has no cervical adenopathy.  Neurological: She is alert and oriented to person, place, and time.  Skin: Skin is warm and dry. No rash noted.  Psychiatric: She has a normal mood and affect. Her behavior is normal. Judgment and thought content normal.  Nursing note and vitals reviewed.  BP 121/80 mmHg  Pulse 85  Temp(Src) 98.9 F (37.2 C) (Oral)  Ht 5\' 7"  (1.702 m)  Wt 226 lb (102.513 kg)  BMI 35.39 kg/m2        Assessment & Plan:  1. Allergic rhinitis due to pollen -Use Flonase regularly every night -Use nasal saline frequently during the day -Take plain Claritin once daily -Drink plenty of fluids  2.  Impacted cerumen of both ears -Impacted cerumen removed from the right ear canal  Meds ordered this encounter  Medications  . fluticasone (FLONASE) 50 MCG/ACT nasal spray    Sig: USE TWO SPRAY(S) IN EACH NOSTRIL ONCE DAILY    Dispense:  16 g    Refill:  11   Patient Instructions  Make sure you use Flonase every night.  Use nasal saline during the day. Use claritin OTC.    Arrie Senate MD

## 2015-10-14 NOTE — Patient Instructions (Signed)
Make sure you use Flonase every night.  Use nasal saline during the day. Use claritin OTC.

## 2015-10-18 ENCOUNTER — Ambulatory Visit (INDEPENDENT_AMBULATORY_CARE_PROVIDER_SITE_OTHER): Payer: BC Managed Care – PPO | Admitting: Family

## 2015-10-18 ENCOUNTER — Encounter: Payer: Self-pay | Admitting: Family

## 2015-10-18 VITALS — BP 122/77 | HR 82 | Temp 98.1°F | Ht 67.0 in | Wt 226.0 lb

## 2015-10-18 DIAGNOSIS — J0110 Acute frontal sinusitis, unspecified: Secondary | ICD-10-CM

## 2015-10-18 MED ORDER — AMOXICILLIN-POT CLAVULANATE 875-125 MG PO TABS: 1.0000 | ORAL_TABLET | Freq: Two times a day (BID) | ORAL | Status: DC

## 2015-10-18 NOTE — Progress Notes (Signed)
   Subjective:    Patient ID: Toni Francis, female    DOB: 1955/09/26, 60 y.o.   MRN: [id number]  Sinusitis This is a recurrent problem. The current episode started 1 to 4 weeks ago. The problem has been waxing and waning since onset. There has been no fever. Her pain is at a severity of 6/10. The pain is mild. Associated symptoms include congestion, coughing, ear pain, headaches, a hoarse voice, sinus pressure and sneezing. Pertinent negatives include no shortness of breath, sore throat or swollen glands. Past treatments include oral decongestants. The treatment provided mild relief.      Review of Systems  Constitutional: Negative.   HENT: Positive for congestion, ear pain, hoarse voice, sinus pressure and sneezing. Negative for sore throat.   Eyes: Negative.   Respiratory: Positive for cough. Negative for shortness of breath.   Cardiovascular: Negative.  Negative for palpitations.  Gastrointestinal: Negative.   Endocrine: Negative.   Genitourinary: Negative.   Musculoskeletal: Negative.   Neurological: Positive for headaches.  Hematological: Negative.   Psychiatric/Behavioral: Negative.   All other systems reviewed and are negative.      Objective:   Physical Exam  Constitutional: She is oriented to person, place, and time. She appears well-developed and well-nourished. No distress.  HENT:  Head: Normocephalic and atraumatic.  Right Ear: External ear normal.  Left Ear: External ear normal.  Nose: Mucosal edema and rhinorrhea present. Left sinus exhibits frontal sinus tenderness.  Eyes: Pupils are equal, round, and reactive to light.  Neck: Normal range of motion. Neck supple. No thyromegaly present.  Cardiovascular: Normal rate, regular rhythm, normal heart sounds and intact distal pulses.   No murmur heard. Pulmonary/Chest: Effort normal and breath sounds normal. No respiratory distress. She has no wheezes.  Abdominal: Soft. Bowel sounds are normal. She exhibits no  distension. There is no tenderness.  Musculoskeletal: Normal range of motion. She exhibits no edema or tenderness.  Neurological: She is alert and oriented to person, place, and time. She has normal reflexes. No cranial nerve deficit.  Skin: Skin is warm and dry.  Psychiatric: She has a normal mood and affect. Her behavior is normal. Judgment and thought content normal.  Vitals reviewed.   BP 122/77 mmHg  Pulse 82  Temp(Src) 98.1 F (36.7 C) (Oral)  Ht 5\' 7"  (1.702 m)  Wt 226 lb (102.513 kg)  BMI 35.39 kg/m2       Assessment & Plan:  1. Acute frontal sinusitis, recurrence not specified -Pt states she has taken augmentin in past and tolerated well -- Take meds as prescribed - Use a cool mist humidifier  -Use saline nose sprays frequently -Saline irrigations of the nose can be very helpful if done frequently.  * 4X daily for 1 week*  * Use of a nettie pot can be helpful with this. Follow directions with this* -Force fluids -For any cough or congestion  Use plain Mucinex- regular strength or max strength is fine   * Children- consult with Pharmacist for dosing -For fever or aces or pains- take tylenol or ibuprofen appropriate for age and weight.  * for fevers greater than 101 orally you may alternate ibuprofen and tylenol every  3 hours. -Throat lozenges if help -New toothbrush in 3 days  amoxicillin-clavulanate (AUGMENTIN) 875-125 MG tablet; Take 1 tablet by mouth 2 (two) times daily.  Dispense: 14 tablet; Refill: 0   Evelina Dun, FNP

## 2015-10-18 NOTE — Patient Instructions (Signed)

## 2015-12-24 ENCOUNTER — Other Ambulatory Visit: Payer: Self-pay | Admitting: Family Medicine

## 2016-04-07 ENCOUNTER — Telehealth: Payer: Self-pay | Admitting: Family Medicine

## 2016-04-07 DIAGNOSIS — Z0000 Encounter for general adult medical examination without abnormal findings: Secondary | ICD-10-CM

## 2016-04-08 NOTE — Telephone Encounter (Signed)
Lab orders placed and left detailed message on VM stating to come in and have them drawn and to call back with any further questions or concerns.

## 2016-04-09 ENCOUNTER — Telehealth: Payer: Self-pay | Admitting: Family Medicine

## 2016-04-09 NOTE — Telephone Encounter (Signed)
Pt called and aware of the [facility]  Pt rule (not taking on any more chronic f/u pt's) - she hasn't had a chronic visit with moore or any provider in over a year -- she was nice and will call Monday to schedule with another provider.

## 2016-04-09 NOTE — Telephone Encounter (Signed)
Returned patients call but she refused to talk with me. She only wants to talk with Roselyn Reef and said that if she couldn't talk with her on the phone then maybe she should just come down here in person. I explained that I would be glad to help her that Roselyn Reef is working with Dr Laurance Flatten and may not have the availability to call her herself. She still would not speak with me and hung up. I will speak with Roselyn Reef and see if she can give her a call.

## 2016-04-14 ENCOUNTER — Encounter: Payer: Self-pay | Admitting: Family

## 2016-04-14 ENCOUNTER — Ambulatory Visit (INDEPENDENT_AMBULATORY_CARE_PROVIDER_SITE_OTHER): Payer: BC Managed Care – PPO | Admitting: Family

## 2016-04-14 VITALS — BP 132/81 | HR 74 | Temp 98.1°F | Ht 67.0 in | Wt 230.0 lb

## 2016-04-14 DIAGNOSIS — J0100 Acute maxillary sinusitis, unspecified: Secondary | ICD-10-CM

## 2016-04-14 MED ORDER — METHYLPREDNISOLONE ACETATE 80 MG/ML IJ SUSP
80.0000 mg | Freq: Once | INTRAMUSCULAR | Status: AC
  Administered 2016-04-14: 80 mg via INTRAMUSCULAR

## 2016-04-14 MED ORDER — AMOXICILLIN-POT CLAVULANATE 875-125 MG PO TABS: 1.0000 | ORAL_TABLET | Freq: Two times a day (BID) | ORAL | 0 refills | Status: DC

## 2016-04-14 MED ORDER — FLUTICASONE PROPIONATE 50 MCG/ACT NA SUSP: NASAL | 11 refills | Status: DC

## 2016-04-14 NOTE — Patient Instructions (Signed)

## 2016-04-14 NOTE — Progress Notes (Signed)
Subjective:    Patient ID: Toni Francis, female    DOB: January 15, 1956, 60 y.o.   MRN: [id number]  Sinus Problem  This is a new problem. The current episode started 1 to 4 weeks ago. The problem has been gradually worsening since onset. There has been no fever. Her pain is at a severity of 8/10. Associated symptoms include congestion, coughing, ear pain, headaches, sinus pressure and sneezing. Pertinent negatives include no shortness of breath, sore throat or swollen glands. Past treatments include oral decongestants, saline sprays and lying down. The treatment provided mild relief.  Ear Fullness   Associated symptoms include coughing and headaches. Pertinent negatives include no sore throat.      Review of Systems  HENT: Positive for congestion, ear pain, sinus pressure and sneezing. Negative for sore throat.   Respiratory: Positive for cough. Negative for shortness of breath.   Neurological: Positive for headaches.  All other systems reviewed and are negative.      Objective:   Physical Exam  Constitutional: She is oriented to person, place, and time. She appears well-developed and well-nourished. No distress.  HENT:  Head: Normocephalic and atraumatic.  Right Ear: External ear normal.  Left Ear: External ear normal.  Nose: Mucosal edema and rhinorrhea present. Right sinus exhibits maxillary sinus tenderness and frontal sinus tenderness. Left sinus exhibits maxillary sinus tenderness and frontal sinus tenderness.  Mouth/Throat: Posterior oropharyngeal erythema present.  Eyes: Pupils are equal, round, and reactive to light.  Neck: Normal range of motion. Neck supple. No thyromegaly present.  Cardiovascular: Normal rate, regular rhythm, normal heart sounds and intact distal pulses.   No murmur heard. Pulmonary/Chest: Effort normal and breath sounds normal. No respiratory distress. She has no wheezes.  Abdominal: Soft. Bowel sounds are normal. She exhibits no distension. There is no  tenderness.  Musculoskeletal: Normal range of motion. She exhibits no edema or tenderness.  Neurological: She is alert and oriented to person, place, and time. She has normal reflexes. No cranial nerve deficit.  Skin: Skin is warm and dry.  Psychiatric: She has a normal mood and affect. Her behavior is normal. Judgment and thought content normal.  Vitals reviewed.     Temp 98.1 F (36.7 C) (Oral)   Ht 5\' 7"  (1.702 m)   Wt 230 lb (104.3 kg)   BMI 36.02 kg/m      Assessment & Plan:  1. Acute maxillary sinusitis, recurrence not specified - Take meds as prescribed - Use a cool mist humidifier  -Use saline nose sprays frequently -Saline irrigations of the nose can be very helpful if done frequently.  * 4X daily for 1 week*  * Use of a nettie pot can be helpful with this. Follow directions with this* -Force fluids -For any cough or congestion  Use plain Mucinex- regular strength or max strength is fine   * Children- consult with Pharmacist for dosing -For fever or aces or pains- take tylenol or ibuprofen appropriate for age and weight.  * for fevers greater than 101 orally you may alternate ibuprofen and tylenol every  3 hours. -Throat lozenges if help - amoxicillin-clavulanate (AUGMENTIN) 875-125 MG tablet; Take 1 tablet by mouth 2 (two) times daily.  Dispense: 14 tablet; Refill: 0 - fluticasone (FLONASE) 50 MCG/ACT nasal spray; USE TWO SPRAY(S) IN EACH NOSTRIL ONCE DAILY  Dispense: 16 g; Refill: 11 - methylPREDNISolone acetate (DEPO-MEDROL) injection 80 mg; Inject 1 mL (80 mg total) into the muscle once.    Evelina Dun, FNP

## 2016-04-15 ENCOUNTER — Other Ambulatory Visit: Payer: BC Managed Care – PPO

## 2016-04-15 DIAGNOSIS — Z0000 Encounter for general adult medical examination without abnormal findings: Secondary | ICD-10-CM

## 2016-04-16 LAB — CMP14+EGFR
ALT: 19 IU/L (ref 0–32)
AST: 16 IU/L (ref 0–40)
Albumin/Globulin Ratio: 2 (ref 1.2–2.2)
Albumin: 4.6 g/dL (ref 3.5–5.5)
Alkaline Phosphatase: 72 IU/L (ref 39–117)
BUN/Creatinine Ratio: 18 (ref 9–23)
BUN: 17 mg/dL (ref 6–24)
Bilirubin Total: 0.8 mg/dL (ref 0.0–1.2)
CO2: 24 mmol/L (ref 18–29)
Calcium: 9.4 mg/dL (ref 8.7–10.2)
Chloride: 103 mmol/L (ref 96–106)
Creatinine, Ser: 0.93 mg/dL (ref 0.57–1.00)
GFR calc Af Amer: 78 mL/min/{1.73_m2} (ref >59)
GFR calc non Af Amer: 67 mL/min/{1.73_m2} (ref >59)
Globulin, Total: 2.3 g/dL (ref 1.5–4.5)
Glucose: 84 mg/dL (ref 65–99)
Potassium: 4.4 mmol/L (ref 3.5–5.2)
Sodium: 143 mmol/L (ref 134–144)
Total Protein: 6.9 g/dL (ref 6.0–8.5)

## 2016-04-16 LAB — CBC WITH DIFFERENTIAL/PLATELET
Basophils Absolute: 0.1 10*3/uL (ref 0.0–0.2)
Basos: 1 %
EOS (ABSOLUTE): 0 10*3/uL (ref 0.0–0.4)
Eos: 0 %
Hematocrit: 41.2 % (ref 34.0–46.6)
Hemoglobin: 14 g/dL (ref 11.1–15.9)
Immature Grans (Abs): 0 10*3/uL (ref 0.0–0.1)
Immature Granulocytes: 0 %
Lymphocytes Absolute: 1.4 10*3/uL (ref 0.7–3.1)
Lymphs: 16 %
MCH: 28.7 pg (ref 26.6–33.0)
MCHC: 34 g/dL (ref 31.5–35.7)
MCV: 85 fL (ref 79–97)
Monocytes Absolute: 0.9 10*3/uL (ref 0.1–0.9)
Monocytes: 11 %
Neutrophils Absolute: 6.1 10*3/uL (ref 1.4–7.0)
Neutrophils: 72 %
Platelets: 269 10*3/uL (ref 150–379)
RBC: 4.87 x10E6/uL (ref 3.77–5.28)
RDW: 14.8 % (ref 12.3–15.4)
WBC: 8.5 10*3/uL (ref 3.4–10.8)

## 2016-04-16 LAB — LIPID PANEL
Chol/HDL Ratio: 2.4 ratio units (ref 0.0–4.4)
Cholesterol, Total: 181 mg/dL (ref 100–199)
HDL: 76 mg/dL (ref >39)
LDL Calculated: 89 mg/dL (ref 0–99)
Triglycerides: 78 mg/dL (ref 0–149)
VLDL Cholesterol Cal: 16 mg/dL (ref 5–40)

## 2016-04-16 LAB — VITAMIN D 25 HYDROXY (VIT D DEFICIENCY, FRACTURES): Vit D, 25-Hydroxy: 40.6 ng/mL (ref 30.0–100.0)

## 2016-04-16 LAB — TSH: TSH: 2.02 u[IU]/mL (ref 0.450–4.500)

## 2016-04-21 ENCOUNTER — Ambulatory Visit: Payer: BC Managed Care – PPO | Admitting: Family Medicine

## 2016-04-27 ENCOUNTER — Telehealth: Payer: Self-pay | Admitting: Family Medicine

## 2016-04-27 ENCOUNTER — Other Ambulatory Visit: Payer: Self-pay | Admitting: Family

## 2016-04-27 DIAGNOSIS — J0100 Acute maxillary sinusitis, unspecified: Secondary | ICD-10-CM

## 2016-04-27 NOTE — Telephone Encounter (Signed)
Patient aware she will need to be seen for a refill on antibiotic 

## 2016-04-27 NOTE — Telephone Encounter (Signed)
Will need to be seen to get another rantiiotic

## 2016-04-28 ENCOUNTER — Ambulatory Visit (INDEPENDENT_AMBULATORY_CARE_PROVIDER_SITE_OTHER): Payer: BC Managed Care – PPO | Admitting: Family Medicine

## 2016-04-28 ENCOUNTER — Encounter: Payer: Self-pay | Admitting: Family Medicine

## 2016-04-28 VITALS — BP 120/74 | HR 69 | Temp 97.3°F | Ht 67.0 in | Wt 234.0 lb

## 2016-04-28 DIAGNOSIS — J0101 Acute recurrent maxillary sinusitis: Secondary | ICD-10-CM

## 2016-04-28 MED ORDER — LEVOFLOXACIN 500 MG PO TABS: 500.0000 mg | ORAL_TABLET | Freq: Every day | ORAL | 0 refills | Status: DC

## 2016-04-28 MED ORDER — BETAMETHASONE SOD PHOS & ACET 6 (3-3) MG/ML IJ SUSP
6.0000 mg | Freq: Once | INTRAMUSCULAR | Status: AC
  Administered 2016-04-28: 6 mg via INTRAMUSCULAR

## 2016-04-28 MED ORDER — MOXIFLOXACIN HCL 400 MG PO TABS: 400.0000 mg | ORAL_TABLET | Freq: Every day | ORAL | 0 refills | Status: DC

## 2016-04-28 NOTE — Progress Notes (Signed)
Subjective:  Patient ID: Toni Francis, female    DOB: Sep 12, 1955  Age: 61 y.o. MRN: [id number]  CC: Sinusitis (pt here today c/o sinus pressure, nasal congestion, ears stopped up and sinus headache)   HPI Toni Francis presents for Symptoms include congestion, facial pain, nasal congestion, no  fever, non productive cough, post nasal drip and sinus pressure with no fever, chills, night sweats or weight loss. Onset of symptoms was a few weeks ago, gradually worsening since finishing the augmentin. Pt.is drinking moderate amounts of fluids.     History Toni Francis has a past medical history of Pinched nerve.   She has a past surgical history that includes Cholecystectomy.   Her family history includes Colon cancer (age of onset: 24) in her maternal uncle.She reports that she has never smoked. She has never used smokeless tobacco. She reports that she does not drink alcohol or use drugs.  Current Outpatient Prescriptions on File Prior to Visit  Medication Sig Dispense Refill  . cyclobenzaprine (FLEXERIL) 10 MG tablet Take 1 tablet (10 mg total) by mouth 3 (three) times daily as needed for muscle spasms. 30 tablet 0  . esomeprazole (NEXIUM) 40 MG capsule TAKE ONE CAPSULE BY MOUTH ONCE DAILY 30 capsule 0  . fluticasone (FLONASE) 50 MCG/ACT nasal spray USE TWO SPRAY(S) IN EACH NOSTRIL ONCE DAILY 16 g 11  . meloxicam (MOBIC) 15 MG tablet Take 1 tablet (15 mg total) by mouth daily. 30 tablet 5  . Multiple Vitamins-Minerals (CENTRUM SILVER PO) Take 1 tablet by mouth daily.    . Omega-3 Fatty Acids (FISH OIL PO) Take 1,400 mg by mouth daily.    . Pseudoephedrine HCl (SUDAFED CONGESTION PO) Take by mouth.     No current facility-administered medications on file prior to visit.     ROS Review of Systems  Constitutional: Negative for activity change, appetite change, chills and fever.  HENT: Positive for congestion, postnasal drip, rhinorrhea and sinus pressure. Negative for ear discharge, ear  pain, hearing loss, nosebleeds, sneezing and trouble swallowing.   Respiratory: Negative for chest tightness and shortness of breath.   Cardiovascular: Negative for chest pain and palpitations.  Skin: Negative for rash.    Objective:  BP 120/74   Pulse 69   Temp 97.3 F (36.3 C) (Oral)   Ht 5\' 7"  (1.702 m)   Wt 234 lb (106.1 kg)   BMI 36.65 kg/m   Physical Exam  Constitutional: She appears well-developed and well-nourished.  HENT:  Head: Normocephalic and atraumatic.  Right Ear: Tympanic membrane and external ear normal. No decreased hearing is noted.  Left Ear: Tympanic membrane and external ear normal. No decreased hearing is noted.  Nose: Mucosal edema present. Right sinus exhibits no frontal sinus tenderness. Left sinus exhibits no frontal sinus tenderness.  Mouth/Throat: No oropharyngeal exudate or posterior oropharyngeal erythema.  Neck: No Brudzinski's sign noted.  Pulmonary/Chest: Breath sounds normal. No respiratory distress.  Lymphadenopathy:       Head (right side): No preauricular adenopathy present.       Head (left side): No preauricular adenopathy present.       Right cervical: No superficial cervical adenopathy present.      Left cervical: No superficial cervical adenopathy present.    Assessment & Plan:   Toni Francis was seen today for sinusitis.  Diagnoses and all orders for this visit:  Acute recurrent maxillary sinusitis -     betamethasone acetate-betamethasone sodium phosphate (CELESTONE) injection 6 mg; Inject 1 mL (6 mg  total) into the muscle once.  Other orders -     Discontinue: moxifloxacin (AVELOX) 400 MG tablet; Take 1 tablet (400 mg total) by mouth daily. Take all of these, for infection -     levofloxacin (LEVAQUIN) 500 MG tablet; Take 1 tablet (500 mg total) by mouth daily. For 10 days   I have discontinued Ms. Ekdahl amoxicillin-clavulanate and moxifloxacin. I am also having her start on levofloxacin. Additionally, I am having her maintain  her Multiple Vitamins-Minerals (CENTRUM SILVER PO), Omega-3 Fatty Acids (FISH OIL PO), cyclobenzaprine, meloxicam, esomeprazole, Pseudoephedrine HCl (SUDAFED CONGESTION PO), and fluticasone. We administered betamethasone acetate-betamethasone sodium phosphate.  Meds ordered this encounter  Medications  . betamethasone acetate-betamethasone sodium phosphate (CELESTONE) injection 6 mg  . DISCONTD: moxifloxacin (AVELOX) 400 MG tablet    Sig: Take 1 tablet (400 mg total) by mouth daily. Take all of these, for infection    Dispense:  14 tablet    Refill:  0  . levofloxacin (LEVAQUIN) 500 MG tablet    Sig: Take 1 tablet (500 mg total) by mouth daily. For 10 days    Dispense:  14 tablet    Refill:  0     Follow-up: Return if symptoms worsen or fail to improve.  Claretta Fraise, M.D.

## 2016-06-24 ENCOUNTER — Encounter: Payer: Self-pay | Admitting: Pediatrics

## 2016-06-24 ENCOUNTER — Ambulatory Visit (INDEPENDENT_AMBULATORY_CARE_PROVIDER_SITE_OTHER): Payer: BC Managed Care – PPO | Admitting: Pediatrics

## 2016-06-24 VITALS — BP 126/85 | HR 72 | Temp 97.7°F | Ht 67.0 in | Wt 229.0 lb

## 2016-06-24 DIAGNOSIS — D485 Neoplasm of uncertain behavior of skin: Secondary | ICD-10-CM | POA: Diagnosis not present

## 2016-06-24 DIAGNOSIS — L739 Follicular disorder, unspecified: Secondary | ICD-10-CM | POA: Diagnosis not present

## 2016-06-24 DIAGNOSIS — D2372 Other benign neoplasm of skin of left lower limb, including hip: Secondary | ICD-10-CM

## 2016-06-24 MED ORDER — MUPIROCIN 2 % EX OINT: TOPICAL_OINTMENT | CUTANEOUS | 1 refills | Status: DC

## 2016-06-24 MED ORDER — DOXYCYCLINE HYCLATE 100 MG PO TABS: 100.0000 mg | ORAL_TABLET | Freq: Two times a day (BID) | ORAL | 0 refills | Status: DC

## 2016-06-24 NOTE — Progress Notes (Signed)
  Subjective:   Patient ID: Toni Francis, female    DOB: [birth year], 61 y.o.   MRN: [id number] CC: Places on legs  HPI: Toni Francis is a 61 y.o. female presenting for Places on legs  After being in a hot tub 2-3 months ago started developing rash in groin, red, not painful or itchy, knots present, sometimes drain, not improving  Has had two small bumps on L upper ant distal thigh that she is very bothered by, cuts or scrapes them regularly when shaving, there for several months Not itching, bleeding, no pain on bumps, wants them removed  Relevant past medical, surgical, family and social history reviewed. Allergies and medications reviewed and updated. History  Smoking Status  . Never Smoker  Smokeless Tobacco  . Never Used   ROS: Per HPI   Objective:    BP 126/85   Pulse 72   Temp 97.7 F (36.5 C) (Oral)   Ht 5\' 7"  (1.702 m)   Wt 229 lb (103.9 kg)   BMI 35.87 kg/m   Wt Readings from Last 3 Encounters:  06/24/16 229 lb (103.9 kg)  04/28/16 234 lb (106.1 kg)  04/14/16 230 lb (104.3 kg)    Gen: NAD, alert, cooperative with exam, NCAT EYES: EOMI, no conjunctival injection, or no icterus CV: WWP Resp: normal WOB Ext: No edema, warm Neuro: Alert and oriented Skin: two apprx 51mm smooth rounded hard flesh colored papules with no surrounding erythema on L upper thigh Inner thigh b/l with slightly red, violaceous red papules 2-103mm, no discharge, no tenderness, no discharge  Assessment & Plan:  Toni Francis was seen today for places on legs.  Diagnoses and all orders for this visit:  Folliculitis Treat with 10 days of doxycycline, mupirocin  if not improving rtc -     doxycycline (VIBRA-TABS) 100 MG tablet; Take 1 tablet (100 mg total) by mouth 2 (two) times daily. -     mupirocin ointment (BACTROBAN) 2 %; Use twice a day on affected areas  Neoplasm of skin of leg or hip, benign, left Discussed options Bothering pt a lot with location, wants gone   After risks, benefits  and alternatives discussed with pt and pt decided to proceed, two papules frozen with 3 10 sec freeze thaw cycles each. pt tolerated well. Wound care discussed.  Follow up plan: 4 weeks Toni Found, MD [place]

## 2016-10-14 ENCOUNTER — Telehealth: Payer: Self-pay | Admitting: Pediatrics

## 2016-10-14 DIAGNOSIS — L989 Disorder of the skin and subcutaneous tissue, unspecified: Secondary | ICD-10-CM

## 2016-10-14 NOTE — Telephone Encounter (Signed)
Bothered by lesions on leg, wants further treatment/removal. Will refer to derm.

## 2016-10-20 ENCOUNTER — Other Ambulatory Visit: Payer: Self-pay | Admitting: Family Medicine

## 2016-12-06 ENCOUNTER — Encounter: Payer: Self-pay | Admitting: Family Medicine

## 2016-12-06 ENCOUNTER — Ambulatory Visit (INDEPENDENT_AMBULATORY_CARE_PROVIDER_SITE_OTHER): Payer: BC Managed Care – PPO | Admitting: Family Medicine

## 2016-12-06 VITALS — BP 128/78 | HR 90 | Temp 97.5°F | Ht 67.0 in | Wt 226.0 lb

## 2016-12-06 DIAGNOSIS — J0111 Acute recurrent frontal sinusitis: Secondary | ICD-10-CM

## 2016-12-06 MED ORDER — AMOXICILLIN-POT CLAVULANATE 875-125 MG PO TABS: 1.0000 | ORAL_TABLET | Freq: Two times a day (BID) | ORAL | 0 refills | Status: DC

## 2016-12-06 NOTE — Progress Notes (Signed)
BP 128/78   Pulse 90   Temp (!) 97.5 F (36.4 C) (Oral)   Ht 5\' 7"  (1.702 m)   Wt 226 lb (102.5 kg)   BMI 35.40 kg/m    Subjective:    Patient ID: Toni Francis, female    DOB: [birth year], 61 y.o.   MRN: [id number]  HPI: Toni Francis is a 61 y.o. female presenting on 12/06/2016 for Sinusitis (tenderness, headache, pressure, sinus drainage, cough; began about 1 month ago)   HPI Sinus congestion and headache and pressure Patient has been having sinus congestion and headache and pressure that started about 1 month ago and has not been improving. She denies any fevers or chills or shortness of breath or drainage. She typically gets like this in the only thing that will work to get rid of it is an antibiotic. She typically says azithromycin will not work for her and she needs something different. She denies any sick contacts that she knows of. She says she does not usually get allergies this time year. She has been using some nasal saline sprays and Mucinex without much improvement.  Relevant past medical, surgical, family and social history reviewed and updated as indicated. Interim medical history since our last visit reviewed. Allergies and medications reviewed and updated.  Review of Systems  Constitutional: Negative for chills and fever.  HENT: Positive for congestion, postnasal drip, rhinorrhea, sinus pressure, sneezing and sore throat. Negative for ear discharge and ear pain.   Eyes: Negative for pain, redness and visual disturbance.  Respiratory: Negative for chest tightness and shortness of breath.   Cardiovascular: Negative for chest pain and leg swelling.  Genitourinary: Negative for difficulty urinating and dysuria.  Musculoskeletal: Negative for back pain and gait problem.  Skin: Negative for rash.  Neurological: Negative for light-headedness and headaches.  Psychiatric/Behavioral: Negative for agitation and behavioral problems.  All other systems reviewed and are  negative.   Per HPI unless specifically indicated above        Objective:    BP 128/78   Pulse 90   Temp (!) 97.5 F (36.4 C) (Oral)   Ht 5\' 7"  (1.702 m)   Wt 226 lb (102.5 kg)   BMI 35.40 kg/m   Wt Readings from Last 3 Encounters:  12/06/16 226 lb (102.5 kg)  06/24/16 229 lb (103.9 kg)  04/28/16 234 lb (106.1 kg)    Physical Exam  Constitutional: She is oriented to person, place, and time. She appears well-developed and well-nourished. No distress.  HENT:  Right Ear: Tympanic membrane, external ear and ear canal normal.  Left Ear: Tympanic membrane, external ear and ear canal normal.  Nose: Mucosal edema and rhinorrhea present. No epistaxis. Right sinus exhibits frontal sinus tenderness. Right sinus exhibits no maxillary sinus tenderness. Left sinus exhibits frontal sinus tenderness. Left sinus exhibits no maxillary sinus tenderness.  Mouth/Throat: Uvula is midline and mucous membranes are normal. Posterior oropharyngeal edema and posterior oropharyngeal erythema present. No oropharyngeal exudate or tonsillar abscesses.  Eyes: Conjunctivae are normal.  Neck: Neck supple. No thyromegaly present.  Cardiovascular: Normal rate, regular rhythm, normal heart sounds and intact distal pulses.   No murmur heard. Pulmonary/Chest: Effort normal and breath sounds normal. No respiratory distress. She has no wheezes. She has no rales.  Musculoskeletal: Normal range of motion. She exhibits no edema or tenderness.  Lymphadenopathy:    She has no cervical adenopathy.  Neurological: She is alert and oriented to person, place, and time. Coordination normal.  Skin:  Skin is warm and dry. No rash noted. She is not diaphoretic.  Psychiatric: She has a normal mood and affect. Her behavior is normal.  Vitals reviewed.       Assessment & Plan:   Problem List Items Addressed This Visit    None    Visit Diagnoses    Acute recurrent frontal sinusitis    -  Primary   Relevant Medications    amoxicillin-clavulanate (AUGMENTIN) 875-125 MG tablet       Follow up plan: Return if symptoms worsen or fail to improve.  Counseling provided for all of the vaccine components No orders of the defined types were placed in this encounter.   Caryl Pina, MD [facility] 12/06/2016, 4:34 PM

## 2016-12-16 ENCOUNTER — Encounter: Payer: Self-pay | Admitting: Pediatrics

## 2016-12-16 ENCOUNTER — Ambulatory Visit (INDEPENDENT_AMBULATORY_CARE_PROVIDER_SITE_OTHER): Payer: BC Managed Care – PPO | Admitting: Pediatrics

## 2016-12-16 VITALS — BP 129/82 | HR 73 | Temp 97.1°F | Ht 67.0 in | Wt 233.0 lb

## 2016-12-16 DIAGNOSIS — W57XXXA Bitten or stung by nonvenomous insect and other nonvenomous arthropods, initial encounter: Secondary | ICD-10-CM | POA: Diagnosis not present

## 2016-12-16 DIAGNOSIS — G5773 Causalgia of bilateral lower limbs: Secondary | ICD-10-CM | POA: Diagnosis not present

## 2016-12-16 MED ORDER — PERMETHRIN 5 % EX CREA: TOPICAL_CREAM | CUTANEOUS | 1 refills | Status: DC

## 2016-12-16 MED ORDER — METHYLPREDNISOLONE ACETATE 80 MG/ML IJ SUSP
80.0000 mg | Freq: Once | INTRAMUSCULAR | Status: AC
  Administered 2016-12-16: 80 mg via INTRAMUSCULAR

## 2016-12-16 NOTE — Progress Notes (Signed)
  Subjective:   Patient ID: Toni Francis, female    DOB: [birth year], 61 y.o.   MRN: [id number] CC: Insect Bite  HPI: Toni Francis is a 61 y.o. female presenting for Insect Bite  Walked through mid shin length grass about a month ago Continues to have stinging burning sensation all over body that comes and goes Feels like pin pricks, one prick at a time Will feel like a sharp burning feeling for a second then goes away  Today on her foot, arm, leg, groin, sometimes back, sometimes face hasnt noticed any rash, irritation on skin where pin pricks are hasnt seen any fleas or bugs Washed her sheets multiple times in hot water No one else in the family with similar symptoms  Has used dog flea topical medication, is not sure if it helped or not  Relevant past medical, surgical, family and social history reviewed. Allergies and medications reviewed and updated. History  Smoking Status  . Never Smoker  Smokeless Tobacco  . Never Used   ROS: Per HPI   Objective:    BP 129/82   Pulse 73   Temp (!) 97.1 F (36.2 C) (Oral)   Ht 5\' 7"  (1.702 m)   Wt 233 lb (105.7 kg)   BMI 36.49 kg/m   Wt Readings from Last 3 Encounters:  12/16/16 233 lb (105.7 kg)  12/06/16 226 lb (102.5 kg)  06/24/16 229 lb (103.9 kg)    Gen: NAD, alert, cooperative with exam, NCAT EYES: EOMI, no conjunctival injection, or no icterus CV:WWP Resp: normal WOB Abd: +BS, soft, NTND. Ext: No edema, warm Neuro: Alert and oriented, strength equal b/l UE and LE, coordination grossly normal Skin: no rash on trunk, arms, legs to explain sensation  Assessment & Plan:  [name] was seen today for insect bite.  Diagnoses and all orders for this visit:  Insect bite, initial encounter No visible rash, no sign of insects Pt wants treatment for bug infestation Discussed not using treatments for animals Will do trial of below Worsening symptoms or new rash should be seen Can try benadryl  -     methylPREDNISolone  acetate (DEPO-MEDROL) injection 80 mg; Inject 1 mL (80 mg total) into the muscle once. -     permethrin (ELIMITE) 5 % cream; massage from head to soles of feet; including hairline, neck, scalp, temple, and forehead, avoid eyes, leave on for 8 hours before shower   Follow up plan: As needed Assunta Found, MD [place]

## 2016-12-17 ENCOUNTER — Ambulatory Visit: Payer: BC Managed Care – PPO | Admitting: Physical Therapy

## 2017-02-16 ENCOUNTER — Ambulatory Visit (INDEPENDENT_AMBULATORY_CARE_PROVIDER_SITE_OTHER): Payer: BC Managed Care – PPO | Admitting: Family Medicine

## 2017-02-16 VITALS — BP 133/79 | HR 69 | Temp 97.6°F | Ht 67.0 in | Wt 233.0 lb

## 2017-02-16 DIAGNOSIS — J0111 Acute recurrent frontal sinusitis: Secondary | ICD-10-CM | POA: Diagnosis not present

## 2017-02-16 MED ORDER — AMOXICILLIN-POT CLAVULANATE 875-125 MG PO TABS: 1.0000 | ORAL_TABLET | Freq: Two times a day (BID) | ORAL | 0 refills | Status: DC

## 2017-02-16 NOTE — Patient Instructions (Addendum)
The nasal spray is called Afrin (Oxymetazoline) can be used twice daily for no more than 3 days  - Get plenty of rest and drink plenty of fluids. - Try to breathe moist air. Use a humidifier or take a steamy shower. - Consume warm fluids (soup or tea) to provide relief for a stuffy nose and to loosen phlegm. - For nasal stuffiness, try saline nasal spray or a Neti Pot. - For sore throat pain relief: suck on throat lozenges, hard candy or popsicles; gargle with warm salt water (1/4 tsp. salt per 8 oz. of water); and eat soft, bland foods. - Eat a well-balanced diet. If you cannot, ensure you are getting enough nutrients by taking a daily multivitamin. - Avoid dairy products, as they can thicken phlegm. - Avoid alcohol, as it impairs your body's immune system.  CONTACT YOUR DOCTOR IF YOU EXPERIENCE ANY OF THE FOLLOWING: - High fever - Ear pain - Worsening headache - Unusually severe cold symptoms - Cough that gets worse while other cold symptoms improve - Flare up of any chronic lung problem, such as asthma

## 2017-02-16 NOTE — Progress Notes (Signed)
Subjective: DG:UYQIH issues PCP: Claretta Fraise, MD [id number] Toni Francis is a 61 y.o. female presenting to clinic today for:  1. Sinus issues Patient reports a 2-week history of frontal headache, associated facial tenderness, nasal congestion, scratchy throat and intermittent cough which is worse at nighttime.  She denies purulence from the nose.  No recent international travel.  No hemoptysis.  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  Intake of food and drink is normal.  No myalgias.  No neck stiffness.  She also reports associated right ear pain.  She notes that she has recurrent sinus infections.  Last sinus infection was in August and was treated with Augmentin.  She reports that she tolerated this well.  Denies of rash, shortness of breath, adverse reaction.  Currently, she has been using Flonase nasal spray with no improvement.  Allergies  Allergen Reactions  . Ampicillin Rash  . Codeine Nausea And Vomiting and Other (See Comments)    Also causes headaches   . Pseudoephedrine Palpitations   Past Medical History:  Diagnosis Date  . Pinched nerve    L4-5,back   Family History  Problem Relation Age of Onset  . Colon cancer Maternal Uncle 50    Current Outpatient Prescriptions:  .  fluticasone (FLONASE) 50 MCG/ACT nasal spray, USE TWO SPRAY(S) IN EACH NOSTRIL ONCE DAILY, Disp: 16 g, Rfl: 11 .  meloxicam (MOBIC) 15 MG tablet, TAKE ONE TABLET BY MOUTH ONCE DAILY, Disp: 30 tablet, Rfl: 0 .  Multiple Vitamins-Minerals (CENTRUM SILVER PO), Take 1 tablet by mouth daily., Disp: , Rfl:  .  amoxicillin-clavulanate (AUGMENTIN) 875-125 MG tablet, Take 1 tablet by mouth 2 (two) times daily., Disp: 20 tablet, Rfl: 0 .  cyclobenzaprine (FLEXERIL) 10 MG tablet, Take 1 tablet (10 mg total) by mouth 3 (three) times daily as needed for muscle spasms., Disp: 30 tablet, Rfl: 0 .  Omega-3 Fatty Acids (FISH OIL PO), Take 1,400 mg by mouth daily., Disp: , Rfl:   Social Hx: never smoker.  Health Maintenance:  FLu shot   ROS: Per HPI  Objective: Office vital signs reviewed. BP 133/79   Pulse 69   Temp 97.6 F (36.4 C) (Oral)   Ht 5\' 7"  (1.702 m)   Wt 233 lb (105.7 kg)   BMI 36.49 kg/m   Physical Examination:  General: Awake, alert, well nourished, nontoxic appearing, No acute distress HEENT: Normal, frontal sinus tenderness.  No maxillary sinus tenderness.    Neck: No masses palpated. No lymphadenopathy, no nuchal rigidity    Ears: Tympanic membranes intact, Left ear: normal light reflex, no erythema, no bulging; Right ear: with effusion and bulging.  No erythema.    Eyes: PERRLA, extraocular membranes intact, sclera white, no ocular discharge    Nose: nasal turbinates moist, clear nasal discharge; turbinates are erythematous and edematous    Throat: moist mucus membranes, mild oropharyngeal erythema, no tonsillar exudate.  Airway is patent Cardio: regular rate and rhythm, S1S2 heard, no murmurs appreciated Pulm: clear to auscultation bilaterally, no wheezes, rhonchi or rales; normal work of breathing on room air  Assessment/ Plan: 61 y.o. female   1. Acute recurrent frontal sinusitis She is afebrile with normal vital signs.  No purulence on exam.  I am somewhat suspicious that this may not be entirely bacterial.  However, she is going on a trip so pocket prescription was provided.  This is a recurrent issue for patient.  I would recommend since she has had several sinus infections that she consider  being seen by ear nose and throat for further evaluation and management.  Last treatment was greater than 1 month ago.  She tolerated amoxicillin well and therefore this has been prescribed.  I did recommend consideration for Afrin nasal spray to help with nasal congestion.  I advised that she not use this longer than 3 days so as to prevent rebound.  Push oral fluids.  Return precautions and reasons for emergent evaluation reviewed with the patient patient was good understanding.  Follow-up with  primary care doctor as needed. - amoxicillin-clavulanate (AUGMENTIN) 875-125 MG tablet; Take 1 tablet by mouth 2 (two) times daily.  Dispense: 20 tablet; Refill: 0   No orders of the defined types were placed in this encounter.  Meds ordered this encounter  Medications  . amoxicillin-clavulanate (AUGMENTIN) 875-125 MG tablet    Sig: Take 1 tablet by mouth 2 (two) times daily.    Dispense:  20 tablet    Refill:  [place], DO [place] [phone]

## 2017-04-07 ENCOUNTER — Other Ambulatory Visit: Payer: Self-pay | Admitting: *Deleted

## 2017-04-07 DIAGNOSIS — M79605 Pain in left leg: Secondary | ICD-10-CM

## 2017-04-07 DIAGNOSIS — M79604 Pain in right leg: Secondary | ICD-10-CM

## 2017-04-07 DIAGNOSIS — M5440 Lumbago with sciatica, unspecified side: Secondary | ICD-10-CM

## 2017-04-07 NOTE — Progress Notes (Signed)
Pt calls in and states that she has been seeing Dr Joya Salm and Dr Maryjean Ka for back pain. She is needing to change DRs because DR Joya Salm is retiring.  She wants referral to DR Rolena Infante at [facility] - this request was ok'd per [facility]   Pt aware we will work on the referral

## 2017-04-30 ENCOUNTER — Other Ambulatory Visit: Payer: Self-pay | Admitting: Family

## 2017-04-30 DIAGNOSIS — J0100 Acute maxillary sinusitis, unspecified: Secondary | ICD-10-CM

## 2017-05-16 ENCOUNTER — Ambulatory Visit: Payer: BC Managed Care – PPO | Admitting: Family Medicine

## 2017-05-16 ENCOUNTER — Encounter: Payer: Self-pay | Admitting: Family Medicine

## 2017-05-16 VITALS — BP 133/83 | HR 81 | Temp 98.3°F | Ht 67.0 in | Wt 237.0 lb

## 2017-05-16 DIAGNOSIS — J329 Chronic sinusitis, unspecified: Secondary | ICD-10-CM

## 2017-05-16 MED ORDER — CEFDINIR 300 MG PO CAPS: 300.0000 mg | ORAL_CAPSULE | Freq: Two times a day (BID) | ORAL | 0 refills | Status: DC

## 2017-05-16 NOTE — Progress Notes (Signed)
Subjective: CC: ?sinus infection PCP: Claretta Fraise, MD INO:MVEHMC Toni Francis is a 62 y.o. female presenting to clinic today for:  1. Sinus symptoms  Patient reports sinus headache, drainage, ear pressure, nasal congestion, dental and facial pain that started 3 weeks ago.  She reports intermittent cough.  Denies hemoptysis, SOB, dizziness, rash, nausea, vomiting, diarrhea, fevers, chills, myalgia, sick contacts, recent travel.  Patient has used Robitussin and Tylenol sinus, Afrin, Flonase with some relief of symptoms.  NO history of COPD or asthma.  NO tobacco use/ exposure.  She does have a history of recurrent sinus infections.  This will be the fifth sinus infection she has had in 1 year.  She has never seen an ear nose and throat doctor.   ROS: Per HPI  Allergies  Allergen Reactions  . Ampicillin Rash  . Codeine Nausea And Vomiting and Other (See Comments)    Also causes headaches   . Pseudoephedrine Palpitations   Past Medical History:  Diagnosis Date  . Pinched nerve    L4-5,back    Current Outpatient Medications:  .  fluticasone (FLONASE) 50 MCG/ACT nasal spray, USE TWO SPRAYS IN EACH NOSTRIL ONCE DAILY, Disp: 16 g, Rfl: 3 .  meloxicam (MOBIC) 15 MG tablet, TAKE ONE TABLET BY MOUTH ONCE DAILY, Disp: 30 tablet, Rfl: 0 .  Multiple Vitamins-Minerals (CENTRUM SILVER PO), Take 1 tablet by mouth daily., Disp: , Rfl:  .  Omega-3 Fatty Acids (FISH OIL PO), Take 1,400 mg by mouth daily., Disp: , Rfl:  Social History   Socioeconomic History  . Marital status: Divorced    Spouse name: Not on file  . Number of children: Not on file  . Years of education: Not on file  . Highest education level: Not on file  Social Needs  . Financial resource strain: Not on file  . Food insecurity - worry: Not on file  . Food insecurity - inability: Not on file  . Transportation needs - medical: Not on file  . Transportation needs - non-medical: Not on file  Occupational History  . Not on file    Tobacco Use  . Smoking status: Never Smoker  . Smokeless tobacco: Never Used  Substance and Sexual Activity  . Alcohol use: No  . Drug use: No  . Sexual activity: Not on file  Other Topics Concern  . Not on file  Social History Narrative  . Not on file   Family History  Problem Relation Age of Onset  . Colon cancer Maternal Uncle 50    Objective: Office vital signs reviewed. BP 133/83   Pulse 81   Temp 98.3 F (36.8 C)   Ht 5\' 7"  (1.702 m)   Wt 237 lb (107.5 kg)   BMI 37.12 kg/m   Physical Examination:  General: Awake, alert, nontoxic, No acute distress HEENT: +TTP to maxillary and frontal sinuses    Neck: No masses palpated. No lymphadenopathy    Ears: Tympanic membranes intact, normal light reflex, no erythema, no bulging    Eyes: PERRLA, extraocular membranes intact, sclera white    Nose: nasal turbinates moist, yellow/white nasal discharge    Throat: moist mucus membranes, no erythema, no tonsillar exudate.  Airway is patent Cardio: regular rate and rhythm, S1S2 heard, no murmurs appreciated Pulm: clear to auscultation bilaterally, no wheezes, rhonchi or rales; normal work of breathing on room air  Assessment/ Plan: 62 y.o. female   1. Recurrent sinus infections Given duration and physical exam, will treat with oral antibiotic.  Given recent use of Augmentin in November, will treat with Omnicef.  Instructions for use reviewed with patient.  Continue sinus irrigations.  Continue Flonase.  Referred to ear nose and throat for further evaluation and management.  Home care instructions reviewed with patient. Strict return precautions and reasons for emergent evaluation in the emergency department review with patient.  They voiced understanding and will follow-up as needed. - Ambulatory referral to ENT   Orders Placed This Encounter  Procedures  . Ambulatory referral to ENT    Referral Priority:   Routine    Referral Type:   Consultation    Referral Reason:    Specialty Services Required    Requested Specialty:   Otolaryngology    Number of Visits Requested:   1   Meds ordered this encounter  Medications  . cefdinir (OMNICEF) 300 MG capsule    Sig: Take 1 capsule (300 mg total) by mouth 2 (two) times daily. 1 po BID    Dispense:  20 capsule    Refill:  [place], DO [place] [phone]

## 2017-05-16 NOTE — Patient Instructions (Signed)
I have referred you to an Ear, nose and throat doctor for your recurrent sinus infections.  You have been prescribed an antibiotic to take twice a day for the next 10 days today.  Continue Flonase nasal spray.  Continue sinus rinses.  I recommend that you pick up either Claritin or Zyrtec over-the-counter and start this daily.  Discontinue use of Afrin.   Sinusitis, Adult Sinusitis is soreness and inflammation of your sinuses. Sinuses are hollow spaces in the bones around your face. Your sinuses are located:  Around your eyes.  In the middle of your forehead.  Behind your nose.  In your cheekbones.  Your sinuses and nasal passages are lined with a stringy fluid (mucus). Mucus normally drains out of your sinuses. When your nasal tissues become inflamed or swollen, the mucus can become trapped or blocked so air cannot flow through your sinuses. This allows bacteria, viruses, and funguses to grow, which leads to infection. Sinusitis can develop quickly and last for 7?10 days (acute) or for more than 12 weeks (chronic). Sinusitis often develops after a cold. What are the causes? This condition is caused by anything that creates swelling in the sinuses or stops mucus from draining, including:  Allergies.  Asthma.  Bacterial or viral infection.  Abnormally shaped bones between the nasal passages.  Nasal growths that contain mucus (nasal polyps).  Narrow sinus openings.  Pollutants, such as chemicals or irritants in the air.  A foreign object stuck in the nose.  A fungal infection. This is rare.  What increases the risk? The following factors may make you more likely to develop this condition:  Having allergies or asthma.  Having had a recent cold or respiratory tract infection.  Having structural deformities or blockages in your nose or sinuses.  Having a weak immune system.  Doing a lot of swimming or diving.  Overusing nasal sprays.  Smoking.  What are the signs or  symptoms? The main symptoms of this condition are pain and a feeling of pressure around the affected sinuses. Other symptoms include:  Upper toothache.  Earache.  Headache.  Bad breath.  Decreased sense of smell and taste.  A cough that may get worse at night.  Fatigue.  Fever.  Thick drainage from your nose. The drainage is often green and it may contain pus (purulent).  Stuffy nose or congestion.  Postnasal drip. This is when extra mucus collects in the throat or back of the nose.  Swelling and warmth over the affected sinuses.  Sore throat.  Sensitivity to light.  How is this diagnosed? This condition is diagnosed based on symptoms, a medical history, and a physical exam. To find out if your condition is acute or chronic, your health care provider may:  Look in your nose for signs of nasal polyps.  Tap over the affected sinus to check for signs of infection.  View the inside of your sinuses using an imaging device that has a light attached (endoscope).  If your health care provider suspects that you have chronic sinusitis, you may also:  Be tested for allergies.  Have a sample of mucus taken from your nose (nasal culture) and checked for bacteria.  Have a mucus sample examined to see if your sinusitis is related to an allergy.  If your sinusitis does not respond to treatment and it lasts longer than 8 weeks, you may have an MRI or CT scan to check your sinuses. These scans also help to determine how severe your infection  is. In rare cases, a bone biopsy may be done to rule out more serious types of fungal sinus disease. How is this treated? Treatment for sinusitis depends on the cause and whether your condition is chronic or acute. If a virus is causing your sinusitis, your symptoms will go away on their own within 10 days. You may be given medicines to relieve your symptoms, including:  Topical nasal decongestants. They shrink swollen nasal passages and let  mucus drain from your sinuses.  Antihistamines. These drugs block inflammation that is triggered by allergies. This can help to ease swelling in your nose and sinuses.  Topical nasal corticosteroids. These are nasal sprays that ease inflammation and swelling in your nose and sinuses.  Nasal saline washes. These rinses can help to get rid of thick mucus in your nose.  If your condition is caused by bacteria, you will be given an antibiotic medicine. If your condition is caused by a fungus, you will be given an antifungal medicine. Surgery may be needed to correct underlying conditions, such as narrow nasal passages. Surgery may also be needed to remove polyps. Follow these instructions at home: Medicines  Take, use, or apply over-the-counter and prescription medicines only as told by your health care provider. These may include nasal sprays.  If you were prescribed an antibiotic medicine, take it as told by your health care provider. Do not stop taking the antibiotic even if you start to feel better. Hydrate and Humidify  Drink enough water to keep your urine clear or pale yellow. Staying hydrated will help to thin your mucus.  Use a cool mist humidifier to keep the humidity level in your home above 50%.  Inhale steam for 10-15 minutes, 3-4 times a day or as told by your health care provider. You can do this in the bathroom while a hot shower is running.  Limit your exposure to cool or dry air. Rest  Rest as much as possible.  Sleep with your head raised (elevated).  Make sure to get enough sleep each night. General instructions  Apply a warm, moist washcloth to your face 3-4 times a day or as told by your health care provider. This will help with discomfort.  Wash your hands often with soap and water to reduce your exposure to viruses and other germs. If soap and water are not available, use hand sanitizer.  Do not smoke. Avoid being around people who are smoking (secondhand  smoke).  Keep all follow-up visits as told by your health care provider. This is important. Contact a health care provider if:  You have a fever.  Your symptoms get worse.  Your symptoms do not improve within 10 days. Get help right away if:  You have a severe headache.  You have persistent vomiting.  You have pain or swelling around your face or eyes.  You have vision problems.  You develop confusion.  Your neck is stiff.  You have trouble breathing. This information is not intended to replace advice given to you by your health care provider. Make sure you discuss any questions you have with your health care provider. Document Released: 04/12/2005 Document Revised: 12/07/2015 Document Reviewed: 02/05/2015 Elsevier Interactive Patient Education  [employer].

## 2017-06-23 ENCOUNTER — Ambulatory Visit: Payer: BC Managed Care – PPO | Admitting: Family Medicine

## 2017-06-23 ENCOUNTER — Encounter: Payer: Self-pay | Admitting: Family Medicine

## 2017-06-23 VITALS — BP 148/90 | HR 109 | Temp 101.2°F | Ht 67.0 in | Wt 241.2 lb

## 2017-06-23 DIAGNOSIS — J0100 Acute maxillary sinusitis, unspecified: Secondary | ICD-10-CM | POA: Diagnosis not present

## 2017-06-23 DIAGNOSIS — R509 Fever, unspecified: Secondary | ICD-10-CM

## 2017-06-23 DIAGNOSIS — J0101 Acute recurrent maxillary sinusitis: Secondary | ICD-10-CM

## 2017-06-23 LAB — VERITOR FLU A/B WAIVED
Influenza A: NEGATIVE
Influenza B: NEGATIVE

## 2017-06-23 MED ORDER — FLUTICASONE PROPIONATE 50 MCG/ACT NA SUSP: NASAL | 3 refills | Status: DC

## 2017-06-23 MED ORDER — DOXYCYCLINE HYCLATE 100 MG PO TABS: 100.0000 mg | ORAL_TABLET | Freq: Two times a day (BID) | ORAL | 0 refills | Status: DC

## 2017-06-23 NOTE — Progress Notes (Signed)
   HPI  Patient presents today with sinus infection and fever.  Patient explains that she was seen on the Jan 21st and treated with [facility] for sinus infection.  She states that she got better but not completely better.  Last night she visited a nursing home and developed a fever after going.  She has cough, congestion, sore throat, and chills.  She is tolerating food and fluids like usual. She also works in a local high school, there has been widespread fluid activity  PMH: Smoking status noted ROS: Per HPI  Objective: BP (!) 148/90   Pulse (!) 109   Temp (!) 101.2 F (38.4 C) (Oral)   Ht 5\' 7"  (1.702 m)   Wt 241 lb 3.2 oz (109.4 kg)   SpO2 98%   BMI 37.78 kg/m  Gen: NAD, alert, cooperative with exam HEENT: NCAT,bilateral tenderness to palpation in maxillary sinuses CV: RRR, good S1/S2, no murmur Resp: CTABL, no wheezes, non-labored Ext: No edema, warm Neuro: Alert and oriented, No gross deficits  Assessment and plan:  #Acute maxillary sinusitis With incomplete resolution of symptoms after Omnicef about 6 weeks ago. Doxycycline Patient with febrile illness starting last night - flu like but flu is negative Also requests increase Flonase, she uses twice daily whenever she has a cold RTC with any concerns     Orders Placed This Encounter  Procedures  . Veritor Flu A/B Waived    Order Specific Question:   Source    Answer:   nasal    Meds ordered this encounter  Medications  . doxycycline (VIBRA-TABS) 100 MG tablet    Sig: Take 1 tablet (100 mg total) by mouth 2 (two) times daily. 1 po bid    Dispense:  20 tablet    Refill:  0  . fluticasone (FLONASE) 50 MCG/ACT nasal spray    Sig: USE TWO SPRAYS IN EACH NOSTRIL ONCE to twice  DAILY    Dispense:  32 g    Refill:  3    Please consider 90 day supplies to promote better adherence    Laroy Apple, MD [facility] 06/23/2017, 3:22 PM

## 2017-06-23 NOTE — Patient Instructions (Addendum)
Great to see you!   Sinusitis, Adult Sinusitis is soreness and inflammation of your sinuses. Sinuses are hollow spaces in the bones around your face. They are located:  Around your eyes.  In the middle of your forehead.  Behind your nose.  In your cheekbones.  Your sinuses and nasal passages are lined with a stringy fluid (mucus). Mucus normally drains out of your sinuses. When your nasal tissues get inflamed or swollen, the mucus can get trapped or blocked so air cannot flow through your sinuses. This lets bacteria, viruses, and funguses grow, and that leads to infection. Follow these instructions at home: Medicines  Take, use, or apply over-the-counter and prescription medicines only as told by your doctor. These may include nasal sprays.  If you were prescribed an antibiotic medicine, take it as told by your doctor. Do not stop taking the antibiotic even if you start to feel better. Hydrate and Humidify  Drink enough water to keep your pee (urine) clear or pale yellow.  Use a cool mist humidifier to keep the humidity level in your home above 50%.  Breathe in steam for 10-15 minutes, 3-4 times a day or as told by your doctor. You can do this in the bathroom while a hot shower is running.  Try not to spend time in cool or dry air. Rest  Rest as much as possible.  Sleep with your head raised (elevated).  Make sure to get enough sleep each night. General instructions  Put a warm, moist washcloth on your face 3-4 times a day or as told by your doctor. This will help with discomfort.  Wash your hands often with soap and water. If there is no soap and water, use hand sanitizer.  Do not smoke. Avoid being around people who are smoking (secondhand smoke).  Keep all follow-up visits as told by your doctor. This is important. Contact a doctor if:  You have a fever.  Your symptoms get worse.  Your symptoms do not get better within 10 days. Get help right away if:  You  have a very bad headache.  You cannot stop throwing up (vomiting).  You have pain or swelling around your face or eyes.  You have trouble seeing.  You feel confused.  Your neck is stiff.  You have trouble breathing. This information is not intended to replace advice given to you by your health care provider. Make sure you discuss any questions you have with your health care provider. Document Released: 09/29/2007 Document Revised: 12/07/2015 Document Reviewed: 02/05/2015 Elsevier Interactive Patient Education  2018 [employer]  

## 2017-06-27 ENCOUNTER — Telehealth: Payer: Self-pay | Admitting: Family Medicine

## 2017-06-27 MED ORDER — LEVOFLOXACIN 750 MG PO TABS
750.0000 mg | ORAL_TABLET | Freq: Every day | ORAL | 0 refills | Status: DC
Start: 2017-06-27 — End: 2017-10-21

## 2017-06-27 NOTE — Telephone Encounter (Signed)
Patient aware and verbalizes understanding. 

## 2017-06-27 NOTE — Telephone Encounter (Signed)
Pt stating not improving on doxy and not tolerating well.   Change to levaquin.   Laroy Apple, MD [facility] 06/27/2017, 1:56 PM

## 2017-08-09 ENCOUNTER — Encounter: Payer: Self-pay | Admitting: Gastroenterology

## 2017-08-15 ENCOUNTER — Other Ambulatory Visit: Payer: Self-pay | Admitting: *Deleted

## 2017-08-15 DIAGNOSIS — M79604 Pain in right leg: Secondary | ICD-10-CM

## 2017-08-15 DIAGNOSIS — M5440 Lumbago with sciatica, unspecified side: Secondary | ICD-10-CM

## 2017-08-15 DIAGNOSIS — M79605 Pain in left leg: Secondary | ICD-10-CM

## 2017-10-11 ENCOUNTER — Encounter: Payer: Self-pay | Admitting: Gastroenterology

## 2017-10-21 ENCOUNTER — Encounter: Payer: Self-pay | Admitting: Family Medicine

## 2017-10-21 ENCOUNTER — Ambulatory Visit: Payer: BC Managed Care – PPO | Admitting: Family Medicine

## 2017-10-21 VITALS — BP 136/85 | HR 86 | Temp 99.2°F | Ht 67.0 in | Wt 234.1 lb

## 2017-10-21 DIAGNOSIS — R600 Localized edema: Secondary | ICD-10-CM

## 2017-10-21 DIAGNOSIS — M5440 Lumbago with sciatica, unspecified side: Secondary | ICD-10-CM

## 2017-10-21 DIAGNOSIS — E559 Vitamin D deficiency, unspecified: Secondary | ICD-10-CM

## 2017-10-21 DIAGNOSIS — E782 Mixed hyperlipidemia: Secondary | ICD-10-CM

## 2017-10-21 LAB — URINALYSIS
Bilirubin, UA: NEGATIVE
Glucose, UA: NEGATIVE
Ketones, UA: NEGATIVE
Nitrite, UA: NEGATIVE
Protein, UA: NEGATIVE
RBC, UA: NEGATIVE
Specific Gravity, UA: 1.025 (ref 1.005–1.030)
Urobilinogen, Ur: 0.2 mg/dL (ref 0.2–1.0)
pH, UA: 5.5 (ref 5.0–7.5)

## 2017-10-21 MED ORDER — MELOXICAM 15 MG PO TABS: 15.0000 mg | ORAL_TABLET | Freq: Every day | ORAL | 1 refills | Status: DC

## 2017-10-21 MED ORDER — DULOXETINE HCL 30 MG PO CPEP: 30.0000 mg | ORAL_CAPSULE | Freq: Every day | ORAL | 1 refills | Status: DC

## 2017-10-21 NOTE — Progress Notes (Signed)
Subjective:  Patient ID: Toni Francis, female    DOB: March 30, 1956  Age: 62 y.o. MRN: [id number]  CC: Medical Management of Chronic Issues   HPI STAR RESLER presents for swelling in legs after she gets her spinal injections.  There has been no swelling recently but she is concerned that it would come back when she goes back for another injection.  Her injections have been somewhat helpful but the most recent one was 2 to 3 months ago and its wearing off and her pain is recurring.  She wants to go get another injection but is concerned about the edema.  She has been set up to see Dr.Hailica a neurosurgeon in [employer] for second opinion and possible surgical repair. Depression screen [facility] 2/9 10/21/2017 06/23/2017 02/16/2017  Decreased Interest 0 0 0  Down, Depressed, Hopeless 0 0 0  PHQ - 2 Score 0 0 0    History Allicia has a past medical history of Pinched nerve.   She has a past surgical history that includes Cholecystectomy.   Her family history includes Colon cancer (age of onset: 64) in her maternal uncle.She reports that she has never smoked. She has never used smokeless tobacco. She reports that she does not drink alcohol or use drugs.    ROS Review of Systems  Constitutional: Negative.   HENT: Negative for congestion.   Eyes: Negative for visual disturbance.  Respiratory: Negative for shortness of breath.   Cardiovascular: Positive for leg swelling. Negative for chest pain.  Gastrointestinal: Negative for abdominal pain, constipation, diarrhea, nausea and vomiting.  Genitourinary: Negative for difficulty urinating.  Musculoskeletal: Positive for arthralgias, back pain, joint swelling and myalgias.  Neurological: Negative for headaches.  Psychiatric/Behavioral: Negative for sleep disturbance.    Objective:  BP 136/85   Pulse 86   Temp 99.2 F (37.3 C) (Oral)   Ht '5\' 7"'  (1.702 m)   Wt 234 lb 2 oz (106.2 kg)   BMI 36.67 kg/m   BP Readings from Last 3  Encounters:  10/21/17 136/85  06/23/17 (!) 148/90  05/16/17 133/83    Wt Readings from Last 3 Encounters:  10/21/17 234 lb 2 oz (106.2 kg)  06/23/17 241 lb 3.2 oz (109.4 kg)  05/16/17 237 lb (107.5 kg)     Physical Exam  Constitutional: She is oriented to person, place, and time. She appears well-developed and well-nourished. No distress.  Cardiovascular: Normal rate and regular rhythm.  Pulmonary/Chest: Breath sounds normal.  Neurological: She is alert and oriented to person, place, and time.  Skin: Skin is warm and dry.  Psychiatric: She has a normal mood and affect.      Assessment & Plan:   Levia was seen today for medical management of chronic issues.  Diagnoses and all orders for this visit:  Low back pain with sciatica, sciatica laterality unspecified, unspecified back pain laterality, unspecified chronicity -     CBC with Differential/Platelet -     CMP14+EGFR -     Lipid panel -     TSH -     Urine Culture -     Urinalysis  Localized edema -     CMP14+EGFR -     Urine Culture -     Urinalysis  Mixed hyperlipidemia -     Lipid panel  Vitamin D deficiency -     VITAMIN D 25 Hydroxy (Vit-D Deficiency, Fractures)  Other orders -     meloxicam (MOBIC) 15 MG tablet; Take 1 tablet (15  mg total) by mouth daily. -     DULoxetine (CYMBALTA) 30 MG capsule; Take 1 capsule (30 mg total) by mouth daily.       I have discontinued Frankey Shown levofloxacin. I have also changed her meloxicam and DULoxetine. Additionally, I am having her maintain her Multiple Vitamins-Minerals (CENTRUM SILVER PO), Omega-3 Fatty Acids (FISH OIL PO), and fluticasone.  Allergies as of 10/21/2017      Reactions   Ampicillin Rash   Codeine Nausea And Vomiting, Other (See Comments)   Also causes headaches   Pseudoephedrine Palpitations      Medication List        Accurate as of 10/21/17  5:41 PM. Always use your most recent med list.          CENTRUM SILVER PO Take 1  tablet by mouth daily.   DULoxetine 30 MG capsule Commonly known as:  CYMBALTA Take 1 capsule (30 mg total) by mouth daily.   FISH OIL PO Take 1,400 mg by mouth daily.   fluticasone 50 MCG/ACT nasal spray Commonly known as:  FLONASE USE TWO SPRAYS IN EACH NOSTRIL ONCE to twice  DAILY   meloxicam 15 MG tablet Commonly known as:  MOBIC Take 1 tablet (15 mg total) by mouth daily.        Follow-up: Return in about 6 months (around 04/22/2018).  Claretta Fraise, M.D.

## 2017-10-21 NOTE — Patient Instructions (Signed)

## 2017-10-22 LAB — CMP14+EGFR
ALT: 17 IU/L (ref 0–32)
AST: 18 IU/L (ref 0–40)
Albumin/Globulin Ratio: 2.1 (ref 1.2–2.2)
Albumin: 4.2 g/dL (ref 3.6–4.8)
Alkaline Phosphatase: 89 IU/L (ref 39–117)
BUN/Creatinine Ratio: 20 (ref 12–28)
BUN: 14 mg/dL (ref 8–27)
Bilirubin Total: 0.4 mg/dL (ref 0.0–1.2)
CO2: 24 mmol/L (ref 20–29)
Calcium: 9.2 mg/dL (ref 8.7–10.3)
Chloride: 108 mmol/L — ABNORMAL HIGH (ref 96–106)
Creatinine, Ser: 0.71 mg/dL (ref 0.57–1.00)
GFR calc Af Amer: 106 mL/min/{1.73_m2} (ref >59)
GFR calc non Af Amer: 92 mL/min/{1.73_m2} (ref >59)
Globulin, Total: 2 g/dL (ref 1.5–4.5)
Glucose: 85 mg/dL (ref 65–99)
Potassium: 4.7 mmol/L (ref 3.5–5.2)
Sodium: 143 mmol/L (ref 134–144)
Total Protein: 6.2 g/dL (ref 6.0–8.5)

## 2017-10-22 LAB — CBC WITH DIFFERENTIAL/PLATELET
Basophils Absolute: 0.1 10*3/uL (ref 0.0–0.2)
Basos: 2 %
EOS (ABSOLUTE): 0.1 10*3/uL (ref 0.0–0.4)
Eos: 1 %
Hematocrit: 39.7 % (ref 34.0–46.6)
Hemoglobin: 12.2 g/dL (ref 11.1–15.9)
Immature Grans (Abs): 0 10*3/uL (ref 0.0–0.1)
Immature Granulocytes: 0 %
Lymphocytes Absolute: 1.7 10*3/uL (ref 0.7–3.1)
Lymphs: 29 %
MCH: 24.4 pg — ABNORMAL LOW (ref 26.6–33.0)
MCHC: 30.7 g/dL — ABNORMAL LOW (ref 31.5–35.7)
MCV: 79 fL (ref 79–97)
Monocytes Absolute: 0.6 10*3/uL (ref 0.1–0.9)
Monocytes: 10 %
Neutrophils Absolute: 3.4 10*3/uL (ref 1.4–7.0)
Neutrophils: 58 %
Platelets: 230 10*3/uL (ref 150–450)
RBC: 5.01 x10E6/uL (ref 3.77–5.28)
RDW: 15.9 % — ABNORMAL HIGH (ref 12.3–15.4)
WBC: 5.8 10*3/uL (ref 3.4–10.8)

## 2017-10-22 LAB — TSH: TSH: 2.91 u[IU]/mL (ref 0.450–4.500)

## 2017-10-22 LAB — URINE CULTURE

## 2017-10-22 LAB — LIPID PANEL
Chol/HDL Ratio: 2.1 ratio (ref 0.0–4.4)
Cholesterol, Total: 146 mg/dL (ref 100–199)
HDL: 68 mg/dL (ref >39)
LDL Calculated: 65 mg/dL (ref 0–99)
Triglycerides: 63 mg/dL (ref 0–149)
VLDL Cholesterol Cal: 13 mg/dL (ref 5–40)

## 2017-10-22 LAB — VITAMIN D 25 HYDROXY (VIT D DEFICIENCY, FRACTURES): Vit D, 25-Hydroxy: 34.9 ng/mL (ref 30.0–100.0)

## 2017-10-26 LAB — IRON AND TIBC
Iron Saturation: 10 % — ABNORMAL LOW (ref 15–55)
Iron: 36 ug/dL (ref 27–139)
Total Iron Binding Capacity: 372 ug/dL (ref 250–450)
UIBC: 336 ug/dL (ref 118–369)

## 2017-10-26 LAB — SPECIMEN STATUS REPORT

## 2017-10-26 LAB — FERRITIN: Ferritin: 10 ng/mL — ABNORMAL LOW (ref 15–150)

## 2017-11-15 ENCOUNTER — Telehealth: Payer: Self-pay | Admitting: *Deleted

## 2017-11-15 NOTE — Telephone Encounter (Signed)
I reviewed her chart.  Pending no bothersome / alarm symptoms, for screening purposes, if no first degree family history of colon cancer she does not warrant another screening colonoscopy until 2024. If she has questions about this or wants to be seen in the clinic to discuss it otherwise that would be fine, but please cancel her colonoscopy. Thanks

## 2017-11-15 NOTE — Telephone Encounter (Signed)
Called pt and attempted to explain why we need to change her recall to 10 yrs vs 5 yrs- explained to her that in 2008 she had hyperplastic polyps, not precancerous and her last colon in 2014 was normal.  She has a maternal uncle that had colon cancer age 61 but NO first degree relative with colon cancer.  Explained to her with this history, she needs to have a colon 10 yrs from 2014 which is 2024.  Explained to her the recommendations have changed since Dr Sharlett Iles as they can and will over the yeras- She was unhappy about this stating that she got a letter from [place] saying it was time for a recall. I explained to her that recommendations have changed since Dr Sharlett Iles placed that recall- she then said she had requested Dr Henrene Pastor and was told he is not accepting new patients and she did not understand what was going on here. I offered her an appointment with Dr Havery Moros, per his note and she refused stating she had to pay an 80$ co pay and she was not doing that to talk to him about this.  She asked why changing the recall time.  I explained again as above- she states insurance is an issue to her- she told me it seemed like we didn't know what was going on here and she was confused.  I attempted to make her understand the reason for the changes but she was not hearing me.  I told her if we do the procedure and it's not medically indicated, she may have to pay for the procedure or some portion of the procedure.  I told her I would change her recall to 10 years, 2024 per Dr Havery Moros and she would receive another letter to schedule her colonoscopy at that time.  I cancelled her PV and her colon as per Dr Havery Moros- she told me again we didn't know what we were doing here but she thanked me for the call.  Explained she can call with questions.

## 2017-11-15 NOTE — Telephone Encounter (Signed)
Dr Havery Moros,  This pt is scheduled for a colonoscopy with you on 8-28 WED.  Her last colon was in 2014 with Dr Sharlett Iles- she had a normal exam- her procedure in 2008 she had Hyperplastic polyps-  She has a family hx of colon cancer in her Maternal uncle at age 61 but no first degree relative.  I just wanted to be sure she needed a colon now or if she needs to be changed to a 10 yr recall.  Please advise, Thanks for your time, Marijean Niemann

## 2017-12-21 ENCOUNTER — Encounter: Payer: BC Managed Care – PPO | Admitting: Gastroenterology

## 2018-04-11 ENCOUNTER — Ambulatory Visit: Payer: BC Managed Care – PPO | Admitting: Family Medicine

## 2018-04-11 ENCOUNTER — Encounter: Payer: Self-pay | Admitting: Family Medicine

## 2018-04-11 ENCOUNTER — Ambulatory Visit (INDEPENDENT_AMBULATORY_CARE_PROVIDER_SITE_OTHER): Payer: BC Managed Care – PPO

## 2018-04-11 VITALS — BP 111/57 | HR 79 | Temp 98.1°F | Ht 67.0 in | Wt 246.0 lb

## 2018-04-11 DIAGNOSIS — M79671 Pain in right foot: Secondary | ICD-10-CM

## 2018-04-11 DIAGNOSIS — R52 Pain, unspecified: Secondary | ICD-10-CM

## 2018-04-11 MED ORDER — KETOROLAC TROMETHAMINE 30 MG/ML IJ SOLN
30.0000 mg | Freq: Once | INTRAMUSCULAR | Status: AC
  Administered 2018-04-11: 30 mg via INTRAMUSCULAR

## 2018-04-11 NOTE — Progress Notes (Signed)
Subjective: CC: Foot pain PCP: Claretta Fraise, MD Toni Francis is a 62 y.o. female presenting to clinic today for:  1. Foot pain Patient reports onset of right foot pain after walking quite a bit in [place].  She notes that she was walking several miles daily.  She also reports a mechanical fall on Sunday where she may have hurt the foot.  She has underlying low back pain.  She points to the lateral aspect of the right foot and heel and states that this is where the pain primarily is.  She has been using meloxicam and Tylenol with no improvement in symptoms.  Denies any sensation changes.  Pain is worse with ambulation and pressure on the foot.   ROS: Per HPI  Allergies  Allergen Reactions  . Ampicillin Rash  . Codeine Nausea And Vomiting and Other (See Comments)    Also causes headaches   . Pseudoephedrine Palpitations   Past Medical History:  Diagnosis Date  . Pinched nerve    L4-5,back    Current Outpatient Medications:  .  DULoxetine (CYMBALTA) 30 MG capsule, Take 1 capsule (30 mg total) by mouth daily. (Patient taking differently: Take 30 mg by mouth daily as needed. ), Disp: 90 capsule, Rfl: 1 .  fluticasone (FLONASE) 50 MCG/ACT nasal spray, USE TWO SPRAYS IN EACH NOSTRIL ONCE to twice  DAILY, Disp: 32 g, Rfl: 3 .  meloxicam (MOBIC) 15 MG tablet, Take 1 tablet (15 mg total) by mouth daily., Disp: 90 tablet, Rfl: 1 .  Multiple Vitamins-Minerals (CENTRUM SILVER PO), Take 1 tablet by mouth daily., Disp: , Rfl:  .  Omega-3 Fatty Acids (FISH OIL PO), Take 1,400 mg by mouth daily., Disp: , Rfl:  Social History   Socioeconomic History  . Marital status: Divorced    Spouse name: Not on file  . Number of children: Not on file  . Years of education: Not on file  . Highest education level: Not on file  Occupational History  . Not on file  Social Needs  . Financial resource strain: Not on file  . Food insecurity:    Worry: Not on file    Inability: Not on file  .  Transportation needs:    Medical: Not on file    Non-medical: Not on file  Tobacco Use  . Smoking status: Never Smoker  . Smokeless tobacco: Never Used  Substance and Sexual Activity  . Alcohol use: No  . Drug use: No  . Sexual activity: Not on file  Lifestyle  . Physical activity:    Days per week: Not on file    Minutes per session: Not on file  . Stress: Not on file  Relationships  . Social connections:    Talks on phone: Not on file    Gets together: Not on file    Attends religious service: Not on file    Active member of club or organization: Not on file    Attends meetings of clubs or organizations: Not on file    Relationship status: Not on file  . Intimate partner violence:    Fear of current or ex partner: Not on file    Emotionally abused: Not on file    Physically abused: Not on file    Forced sexual activity: Not on file  Other Topics Concern  . Not on file  Social History Narrative  . Not on file   Family History  Problem Relation Age of Onset  . Colon cancer  Maternal Uncle 50    Objective: Office vital signs reviewed. BP (!) 111/57   Pulse 79   Temp 98.1 F (36.7 C) (Oral)   Ht 5\' 7"  (1.702 m)   Wt 246 lb (111.6 kg)   BMI 38.53 kg/m   Physical Examination:  General: Awake, alert, well nourished, No acute distress Extremities: warm, well perfused, No edema, cyanosis or clubbing; +2 pulses bilaterally MSK: slightly antalgic gait and station  Right foot: No visible deformities.  No skin color changes.  No gross swelling.  Patient has full active range of motion of the foot.  She is wearing appropriate footwear. Skin: dry; intact; no rashes or lesions  No results found.  Assessment/ Plan: 62 y.o. female   1. Right foot pain Physical exam notable for pain along the lateral and plantar lateral aspect of the right foot.  X-rays were obtained given reports of mechanical fall to evaluate for fracture.  I do question possible stress fracture but was  not able to appreciate this on personal review of the x-ray.  Her pinky toe does appear to have a possible old fracture dislocation as the joint space looks pronounced.  Awaiting formal review by radiology.  She was given a dose of Toradol and instructed to skip her meloxicam dose tonight.  I have placed her in a postop shoe in efforts to offset the pressure of her foot.  She is to return for recheck in 2 weeks with PCP.  If symptoms are persistent or worsening, low threshold to obtain repeat x-rays and possibly refer to orthopedics. - DG Foot Complete Right; Future - ketorolac (TORADOL) 30 MG/ML injection 30 mg   Orders Placed This Encounter  Procedures  . DG Foot Complete Right    Standing Status:   Future    Number of Occurrences:   1    Standing Expiration Date:   06/11/2019    Order Specific Question:   Reason for Exam (SYMPTOM  OR DIAGNOSIS REQUIRED)    Answer:   PAIN    Order Specific Question:   Preferred imaging location?    Answer:   Internal   No orders of the defined types were placed in this encounter.    Toni Norlander, DO [place] [phone]

## 2018-04-11 NOTE — Patient Instructions (Addendum)
You were given a dose of toradol here in office.  Do not take the Meloxicam tonight.  I recommend that you take 1 tylenol every 8 hours for pain.  Use ice.  You may use epsom salt soaks and a topical pain cream of your choice.  Use the walking shoe provided today.  See Dr Livia Snellen in 2 weeks to recheck.  If you pain persists, he may need to get another xray.  Foot Pain Many things can cause foot pain. Some common causes are:  An injury.  A sprain.  Arthritis.  Blisters.  Bunions.  Follow these instructions at home: Pay attention to any changes in your symptoms. Take these actions to help with your discomfort:  If directed, put ice on the affected area: ? Put ice in a plastic bag. ? Place a towel between your skin and the bag. ? Leave the ice on for 15-20 minutes, 3?4 times a day for 2 days.  Take over-the-counter and prescription medicines only as told by your health care provider.  Wear comfortable, supportive shoes that fit you well. Do not wear high heels.  Do not stand or walk for long periods of time.  Do not lift a lot of weight. This can put added pressure on your feet.  Do stretches to relieve foot pain and stiffness as told by your health care provider.  Rub your foot gently.  Keep your feet clean and dry.  Contact a health care provider if:  Your pain does not get better after a few days of self-care.  Your pain gets worse.  You cannot stand on your foot. Get help right away if:  Your foot is numb or tingling.  Your foot or toes are swollen.  Your foot or toes turn white or blue.  You have warmth and redness along your foot. This information is not intended to replace advice given to you by your health care provider. Make sure you discuss any questions you have with your health care provider. Document Released: 05/09/2015 Document Revised: 09/18/2015 Document Reviewed: 05/08/2014 Elsevier Interactive Patient Education  [employer].

## 2018-04-21 ENCOUNTER — Ambulatory Visit: Payer: BC Managed Care – PPO | Admitting: Family Medicine

## 2018-04-21 ENCOUNTER — Encounter: Payer: Self-pay | Admitting: Family Medicine

## 2018-04-21 VITALS — BP 143/84 | HR 97 | Temp 97.0°F | Ht 67.0 in | Wt 243.6 lb

## 2018-04-21 DIAGNOSIS — E782 Mixed hyperlipidemia: Secondary | ICD-10-CM | POA: Diagnosis not present

## 2018-04-21 DIAGNOSIS — S93491A Sprain of other ligament of right ankle, initial encounter: Secondary | ICD-10-CM

## 2018-04-21 MED ORDER — PREDNISONE 10 MG PO TABS: ORAL_TABLET | ORAL | 0 refills | Status: DC

## 2018-04-21 NOTE — Progress Notes (Signed)
Subjective:  Patient ID: Toni Francis, female    DOB: 14-Aug-1955  Age: 62 y.o. MRN: [id number]  CC: 6 month follow up (chronic medical conditions) and Foot Pain (right- Patient was seen 12/17 and states she wants it re checked)   HPI Toni Francis presents for stepped wrong on 12/24 and turned right ankle. Had already been hurting. Now worse since this injury. Painful for walking. Ace  eleastic bbrace helps a little.   Pt. Treated for chronic low back pain. Has flare with pain going down right thigh and leg laterally from lumbar midline approx. L4. Pain is moderate. Some relief with meloxicam  Depression screen [facility] 2/9 04/11/2018 10/21/2017 06/23/2017  Decreased Interest 1 0 0  Down, Depressed, Hopeless 0 0 0  PHQ - 2 Score 1 0 0  Altered sleeping 0 - -  Tired, decreased energy 0 - -  Change in appetite 0 - -  Feeling bad or failure about yourself  0 - -  Trouble concentrating 0 - -  Moving slowly or fidgety/restless 0 - -  Suicidal thoughts 0 - -  PHQ-9 Score 1 - -  Difficult doing work/chores Not difficult at all - -    History Toni Francis has a past medical history of Pinched nerve.   She has a past surgical history that includes Cholecystectomy.   Her family history includes Colon cancer (age of onset: 20) in her maternal uncle.She reports that she has never smoked. She has never used smokeless tobacco. She reports that she does not drink alcohol or use drugs.    ROS Review of Systems  Constitutional: Negative.   HENT: Negative.   Eyes: Negative for visual disturbance.  Respiratory: Negative for shortness of breath.   Cardiovascular: Negative for chest pain.  Gastrointestinal: Negative for abdominal pain.  Musculoskeletal: Negative for arthralgias.    Objective:  BP (!) 143/84   Pulse 97   Temp (!) 97 F (36.1 C) (Oral)   Ht '5\' 7"'  (1.702 m)   Wt 243 lb 9.6 oz (110.5 kg)   BMI 38.15 kg/m   BP Readings from Last 3 Encounters:  04/21/18 (!) 143/84  04/11/18 (!)  111/57  10/21/17 136/85    Wt Readings from Last 3 Encounters:  04/21/18 243 lb 9.6 oz (110.5 kg)  04/11/18 246 lb (111.6 kg)  10/21/17 234 lb 2 oz (106.2 kg)     Physical Exam Constitutional:      General: She is not in acute distress.    Appearance: She is well-developed.  HENT:     Head: Normocephalic and atraumatic.  Eyes:     Conjunctiva/sclera: Conjunctivae normal.     Pupils: Pupils are equal, round, and reactive to light.  Neck:     Musculoskeletal: Normal range of motion and neck supple.     Thyroid: No thyromegaly.  Cardiovascular:     Rate and Rhythm: Normal rate and regular rhythm.     Heart sounds: Normal heart sounds. No murmur.  Pulmonary:     Effort: Pulmonary effort is normal. No respiratory distress.     Breath sounds: Normal breath sounds. No wheezing or rales.  Abdominal:     General: Bowel sounds are normal. There is no distension.     Palpations: Abdomen is soft.     Tenderness: There is no abdominal tenderness.  Musculoskeletal: Normal range of motion.        General: Swelling and tenderness (right ankle laterally) present.  Lymphadenopathy:  Cervical: No cervical adenopathy.  Skin:    General: Skin is warm and dry.  Neurological:     Mental Status: She is alert and oriented to person, place, and time.  Psychiatric:        Behavior: Behavior normal.        Thought Content: Thought content normal.        Judgment: Judgment normal.       Assessment & Plan:   Toni Francis was seen today for 6 month follow up and foot pain.  Diagnoses and all orders for this visit:  Mixed hyperlipidemia -     CBC with Differential/Platelet -     CMP14+EGFR  Sprain of anterior talofibular ligament of right ankle, initial encounter  Other orders -     predniSONE (DELTASONE) 10 MG tablet; Take 5 daily for 2 days followed by 4,3,2 and 1 for 2 days each.       I have discontinued Frankey Shown DULoxetine. I am also having her start on predniSONE.  Additionally, I am having her maintain her Multiple Vitamins-Minerals (CENTRUM SILVER PO), Omega-3 Fatty Acids (FISH OIL PO), fluticasone, and meloxicam.  Allergies as of 04/21/2018      Reactions   Ampicillin Rash   Codeine Nausea And Vomiting, Other (See Comments)   Also causes headaches   Pseudoephedrine Palpitations      Medication List       Accurate as of April 21, 2018 12:09 PM. Always use your most recent med list.        CENTRUM SILVER PO Take 1 tablet by mouth daily.   FISH OIL PO Take 1,400 mg by mouth daily.   fluticasone 50 MCG/ACT nasal spray Commonly known as:  FLONASE USE TWO SPRAYS IN EACH NOSTRIL ONCE to twice  DAILY   meloxicam 15 MG tablet Commonly known as:  MOBIC Take 1 tablet (15 mg total) by mouth daily.   predniSONE 10 MG tablet Commonly known as:  DELTASONE Take 5 daily for 2 days followed by 4,3,2 and 1 for 2 days each.       ASO dispensed. Wear for two weeks. Stay off of foot as much as feasible.  Follow-up: Return in about 6 months (around 10/21/2018).  Claretta Fraise, M.D.

## 2018-04-22 LAB — CBC WITH DIFFERENTIAL/PLATELET
Basophils Absolute: 0.1 10*3/uL (ref 0.0–0.2)
Basos: 1 %
EOS (ABSOLUTE): 0.1 10*3/uL (ref 0.0–0.4)
Eos: 1 %
Hematocrit: 40.6 % (ref 34.0–46.6)
Hemoglobin: 13.4 g/dL (ref 11.1–15.9)
Immature Grans (Abs): 0 10*3/uL (ref 0.0–0.1)
Immature Granulocytes: 0 %
Lymphocytes Absolute: 1.6 10*3/uL (ref 0.7–3.1)
Lymphs: 19 %
MCH: 27.7 pg (ref 26.6–33.0)
MCHC: 33 g/dL (ref 31.5–35.7)
MCV: 84 fL (ref 79–97)
Monocytes Absolute: 0.6 10*3/uL (ref 0.1–0.9)
Monocytes: 7 %
Neutrophils Absolute: 6.1 10*3/uL (ref 1.4–7.0)
Neutrophils: 72 %
Platelets: 271 10*3/uL (ref 150–450)
RBC: 4.83 x10E6/uL (ref 3.77–5.28)
RDW: 14.3 % (ref 12.3–15.4)
WBC: 8.5 10*3/uL (ref 3.4–10.8)

## 2018-04-22 LAB — CMP14+EGFR
ALT: 20 IU/L (ref 0–32)
AST: 18 IU/L (ref 0–40)
Albumin/Globulin Ratio: 2.1 (ref 1.2–2.2)
Albumin: 4.2 g/dL (ref 3.6–4.8)
Alkaline Phosphatase: 76 IU/L (ref 39–117)
BUN/Creatinine Ratio: 18 (ref 12–28)
BUN: 18 mg/dL (ref 8–27)
Bilirubin Total: 0.4 mg/dL (ref 0.0–1.2)
CO2: 22 mmol/L (ref 20–29)
Calcium: 9.3 mg/dL (ref 8.7–10.3)
Chloride: 108 mmol/L — ABNORMAL HIGH (ref 96–106)
Creatinine, Ser: 0.99 mg/dL (ref 0.57–1.00)
GFR calc Af Amer: 71 mL/min/{1.73_m2} (ref >59)
GFR calc non Af Amer: 62 mL/min/{1.73_m2} (ref >59)
Globulin, Total: 2 g/dL (ref 1.5–4.5)
Glucose: 98 mg/dL (ref 65–99)
Potassium: 4 mmol/L (ref 3.5–5.2)
Sodium: 143 mmol/L (ref 134–144)
Total Protein: 6.2 g/dL (ref 6.0–8.5)

## 2018-04-24 NOTE — Progress Notes (Signed)
Hello Toni Francis,  Your lab result is normal.Some minor variations that are not significant are commonly marked abnormal, but do not represent any medical problem for you.  Best regards, Quinnton Bury, M.D.

## 2018-05-03 ENCOUNTER — Ambulatory Visit: Payer: BC Managed Care – PPO | Admitting: Family Medicine

## 2018-07-08 ENCOUNTER — Other Ambulatory Visit: Payer: Self-pay | Admitting: Family Medicine

## 2018-07-08 DIAGNOSIS — J0100 Acute maxillary sinusitis, unspecified: Secondary | ICD-10-CM

## 2018-07-08 MED ORDER — FLUTICASONE PROPIONATE 50 MCG/ACT NA SUSP: NASAL | 6 refills | Status: DC

## 2018-07-08 NOTE — Telephone Encounter (Signed)
Sent in refill of patient's Flonase

## 2018-08-25 ENCOUNTER — Other Ambulatory Visit: Payer: Self-pay | Admitting: Family Medicine

## 2018-08-26 ENCOUNTER — Other Ambulatory Visit: Payer: Self-pay | Admitting: Family

## 2018-08-26 MED ORDER — MELOXICAM 15 MG PO TABS: 15.0000 mg | ORAL_TABLET | Freq: Every day | ORAL | 1 refills | Status: DC

## 2018-08-26 NOTE — Progress Notes (Signed)
Mobic Prescription sent to pharmacy

## 2018-09-01 ENCOUNTER — Ambulatory Visit (INDEPENDENT_AMBULATORY_CARE_PROVIDER_SITE_OTHER): Payer: BC Managed Care – PPO | Admitting: Family Medicine

## 2018-09-01 ENCOUNTER — Encounter: Payer: Self-pay | Admitting: Family Medicine

## 2018-09-01 ENCOUNTER — Other Ambulatory Visit: Payer: Self-pay

## 2018-09-01 DIAGNOSIS — H00012 Hordeolum externum right lower eyelid: Secondary | ICD-10-CM | POA: Diagnosis not present

## 2018-09-01 MED ORDER — POLYMYXIN B-TRIMETHOPRIM 10000-0.1 UNIT/ML-% OP SOLN: 1.0000 [drp] | OPHTHALMIC | 0 refills | Status: AC

## 2018-09-01 NOTE — Progress Notes (Signed)
Virtual Visit via telephone Note Due to COVID-19, visit is conducted virtually and was requested by patient. This visit type was conducted due to national recommendations for restrictions regarding the COVID-19 Pandemic (e.g. social distancing) in an effort to limit this patient's exposure and mitigate transmission in our community. All issues noted in this document were discussed and addressed.  A physical exam was not performed with this format.   I connected with Toni Francis on 09/01/18 at 1025 by telephone and verified that I am speaking with the correct person using two identifiers. Toni Francis is currently located at work and family is currently with them during visit. The provider, Monia Pouch, FNP is located in their office at time of visit.  I discussed the limitations, risks, security and privacy concerns of performing an evaluation and management service by telephone and the availability of in person appointments. I also discussed with the patient that there may be a patient responsible charge related to this service. The patient expressed understanding and agreed to proceed.  Subjective:  Patient ID: Toni Francis, female    DOB: 03/26/1956, 63 y.o.   MRN: [id number]  Chief Complaint:  Eye Problem (right eye )   HPI: Toni Francis is a 63 y.o. female presenting on 09/01/2018 for Eye Problem (right eye )   Eye Problem   The right eye is affected. The current episode started in the past 7 days. The problem occurs constantly. The problem has been gradually worsening. There was no injury mechanism. The pain is at a severity of 3/10. The pain is mild. There is no known exposure to pink eye. She does not wear contacts. Associated symptoms include an eye discharge, eye redness and itching. Pertinent negatives include no blurred vision, double vision, fever, foreign body sensation, nausea, photophobia, recent URI, vomiting or weakness. Treatments tried: warm compresses. The treatment  provided no relief.     Relevant past medical, surgical, family, and social history reviewed and updated as indicated.  Allergies and medications reviewed and updated.   Past Medical History:  Diagnosis Date  . Pinched nerve    L4-5,back    Past Surgical History:  Procedure Laterality Date  . CHOLECYSTECTOMY      Social History   Socioeconomic History  . Marital status: Divorced    Spouse name: Not on file  . Number of children: Not on file  . Years of education: Not on file  . Highest education level: Not on file  Occupational History  . Not on file  Social Needs  . Financial resource strain: Not on file  . Food insecurity:    Worry: Not on file    Inability: Not on file  . Transportation needs:    Medical: Not on file    Non-medical: Not on file  Tobacco Use  . Smoking status: Never Smoker  . Smokeless tobacco: Never Used  Substance and Sexual Activity  . Alcohol use: No  . Drug use: No  . Sexual activity: Not on file  Lifestyle  . Physical activity:    Days per week: Not on file    Minutes per session: Not on file  . Stress: Not on file  Relationships  . Social connections:    Talks on phone: Not on file    Gets together: Not on file    Attends religious service: Not on file    Active member of club or organization: Not on file    Attends meetings of  clubs or organizations: Not on file    Relationship status: Not on file  . Intimate partner violence:    Fear of current or ex partner: Not on file    Emotionally abused: Not on file    Physically abused: Not on file    Forced sexual activity: Not on file  Other Topics Concern  . Not on file  Social History Narrative  . Not on file    Outpatient Encounter Medications as of 09/01/2018  Medication Sig  . fluticasone (FLONASE) 50 MCG/ACT nasal spray USE TWO SPRAYS IN EACH NOSTRIL ONCE to twice  DAILY  . meloxicam (MOBIC) 15 MG tablet Take 1 tablet by mouth once daily  . meloxicam (MOBIC) 15 MG tablet  Take 1 tablet (15 mg total) by mouth daily.  . Multiple Vitamins-Minerals (CENTRUM SILVER PO) Take 1 tablet by mouth daily.  . Omega-3 Fatty Acids (FISH OIL PO) Take 1,400 mg by mouth daily.  . predniSONE (DELTASONE) 10 MG tablet Take 5 daily for 2 days followed by 4,3,2 and 1 for 2 days each.  . trimethoprim-polymyxin b (POLYTRIM) ophthalmic solution Place 1 drop into the right eye every 4 (four) hours for 5 days.   No facility-administered encounter medications on file as of 09/01/2018.     Allergies  Allergen Reactions  . Ampicillin Rash  . Codeine Nausea And Vomiting and Other (See Comments)    Also causes headaches   . Pseudoephedrine Palpitations    Review of Systems  Constitutional: Negative for chills, fatigue and fever.  HENT: Negative for congestion.   Eyes: Positive for pain, discharge, redness and itching. Negative for blurred vision, double vision, photophobia and visual disturbance.  Respiratory: Negative for cough and shortness of breath.   Cardiovascular: Negative for chest pain, palpitations and leg swelling.  Gastrointestinal: Negative for nausea and vomiting.  Musculoskeletal: Negative for arthralgias and myalgias.  Neurological: Negative for dizziness, facial asymmetry, weakness, light-headedness and headaches.  Psychiatric/Behavioral: Negative for confusion.  All other systems reviewed and are negative.        Observations/Objective: No vital signs or physical exam, this was a telephone or virtual health encounter.  Pt alert and oriented, answers all questions appropriately, and able to speak in full sentences.    Assessment and Plan: Keryn was seen today for eye problem.  Diagnoses and all orders for this visit:  Hordeolum externum of right lower eyelid Due to ongoing symptoms and drainage, will treat with below. Warm compresses several times per day. Wash area with baby shampoo at least twice daily. Report any new or worsening symptoms. Medications  as prescribed.  -     trimethoprim-polymyxin b (POLYTRIM) ophthalmic solution; Place 1 drop into the right eye every 4 (four) hours for 5 days.     Follow Up Instructions: Return if symptoms worsen or fail to improve.    I discussed the assessment and treatment plan with the patient. The patient was provided an opportunity to ask questions and all were answered. The patient agreed with the plan and demonstrated an understanding of the instructions.   The patient was advised to call back or seek an in-person evaluation if the symptoms worsen or if the condition fails to improve as anticipated.  The above assessment and management plan was discussed with the patient. The patient verbalized understanding of and has agreed to the management plan. Patient is aware to call the clinic if symptoms persist or worsen. Patient is aware when to return to the clinic for  a follow-up visit. Patient educated on when it is appropriate to go to the emergency department.    I provided 15 minutes of non-face-to-face time during this encounter. The call started at 1025. The call ended at 1040. The other time was used for coordination of care.    Monia Pouch, FNP-C [facility] [address] [place], [place] [address] [phone]

## 2018-10-23 ENCOUNTER — Ambulatory Visit: Payer: BC Managed Care – PPO | Admitting: Family Medicine

## 2019-01-18 ENCOUNTER — Encounter: Payer: Self-pay | Admitting: Family Medicine

## 2019-01-18 ENCOUNTER — Ambulatory Visit: Payer: BC Managed Care – PPO | Admitting: Family Medicine

## 2019-01-18 ENCOUNTER — Other Ambulatory Visit: Payer: Self-pay | Admitting: *Deleted

## 2019-01-18 VITALS — BP 139/83 | HR 71 | Temp 96.2°F | Resp 16 | Ht 67.0 in | Wt 266.6 lb

## 2019-01-18 DIAGNOSIS — R600 Localized edema: Secondary | ICD-10-CM | POA: Diagnosis not present

## 2019-01-18 DIAGNOSIS — J0100 Acute maxillary sinusitis, unspecified: Secondary | ICD-10-CM

## 2019-01-18 MED ORDER — MELOXICAM 15 MG PO TABS: 15.0000 mg | ORAL_TABLET | Freq: Every day | ORAL | 1 refills | Status: DC

## 2019-01-18 MED ORDER — DULOXETINE HCL 30 MG PO CPEP: 30.0000 mg | ORAL_CAPSULE | Freq: Every day | ORAL | 1 refills | Status: DC

## 2019-01-18 MED ORDER — AMOXICILLIN-POT CLAVULANATE 875-125 MG PO TABS: 1.0000 | ORAL_TABLET | Freq: Two times a day (BID) | ORAL | 0 refills | Status: DC

## 2019-01-18 MED ORDER — TRIAMTERENE-HCTZ 37.5-25 MG PO TABS: 1.0000 | ORAL_TABLET | Freq: Every day | ORAL | 3 refills | Status: DC

## 2019-01-18 NOTE — Progress Notes (Signed)
 Subjective:  Patient ID: Toni Francis, female    DOB: 11/12/1955  Age: 63 y.o. MRN: [id number]  CC: swelling in feet and Medical Management of Chronic Issues   HPI Toni Francis presents for swelling in the legs. Chronic. INcreasing. Related to spinal condition. Pain also radiates down LLE. Under consideration for spinal stimulator. Getting spinal injections that help some. Feels cymbalta makes her groggy if Toni Francis misses a day. Also taking meloxicam prn for pain  Patient presents with upper respiratory congestion. Rhinorrhea that is frequently purulent. There is moderate sore throat. Patient reports coughing frequently as well.  no sputum noted. There is no fever, chills or sweats. The patient denies being short of breath. Onset was 3-5 days ago. Gradually worsening. Tried OTCs without improvement.   Depression screen PHQ 2/9 01/18/2019 09/01/2018 04/11/2018  Decreased Interest 0 0 1  Down, Depressed, Hopeless 0 0 0  PHQ - 2 Score 0 0 1  Altered sleeping - - 0  Tired, decreased energy - - 0  Change in appetite - - 0  Feeling bad or failure about yourself  - - 0  Trouble concentrating - - 0  Moving slowly or fidgety/restless - - 0  Suicidal thoughts - - 0  PHQ-9 Score - - 1  Difficult doing work/chores - - Not difficult at all    History Toni Francis has a past medical history of Pinched nerve.   Toni Francis has a past surgical history that includes Cholecystectomy.   Her family history includes Colon cancer (age of onset: 50) in her maternal uncle.Toni Francis reports that Toni Francis has never smoked. Toni Francis has never used smokeless tobacco. Toni Francis reports that Toni Francis does not drink alcohol or use drugs.    ROS Review of Systems  Constitutional: Negative for activity change, appetite change, chills and fever.  HENT: Positive for congestion, postnasal drip, rhinorrhea and sinus pressure. Negative for ear discharge, ear pain, hearing loss, nosebleeds, sneezing and trouble swallowing.   Respiratory: Negative for chest  tightness and shortness of breath.   Cardiovascular: Negative for chest pain and palpitations.  Skin: Negative for rash.    Objective:  BP 139/83   Pulse 71   Temp (!) 96.2 F (35.7 C) (Temporal)   Resp 16   Ht 5' 7" (1.702 m)   Wt 266 lb 9.6 oz (120.9 kg)   SpO2 99%   BMI 41.76 kg/m   BP Readings from Last 3 Encounters:  01/18/19 139/83  04/21/18 (!) 143/84  04/11/18 (!) 111/57    Wt Readings from Last 3 Encounters:  01/18/19 266 lb 9.6 oz (120.9 kg)  04/21/18 243 lb 9.6 oz (110.5 kg)  04/11/18 246 lb (111.6 kg)     Physical Exam Constitutional:      General: Toni Francis is not in acute distress.    Appearance: Toni Francis is well-developed.  Cardiovascular:     Rate and Rhythm: Normal rate and regular rhythm.  Pulmonary:     Breath sounds: Normal breath sounds.  Musculoskeletal:        General: Tenderness present.     Right lower leg: Edema present.     Left lower leg: Edema present.  Skin:    General: Skin is warm and dry.  Neurological:     Mental Status: Toni Francis is alert and oriented to person, place, and time.       Assessment & Plan:   Toni Francis was seen today for swelling in feet and medical management of chronic issues.  Diagnoses and all orders   for this visit:  Edema of both legs -     Brain natriuretic peptide -     CMP14+EGFR -     CBC with Differential/Platelet  Acute maxillary sinusitis, recurrence not specified  Other orders -     DULoxetine (CYMBALTA) 30 MG capsule; Take 1 capsule (30 mg total) by mouth daily. -     meloxicam (MOBIC) 15 MG tablet; Take 1 tablet (15 mg total) by mouth daily. -     amoxicillin-clavulanate (AUGMENTIN) 875-125 MG tablet; Take 1 tablet by mouth 2 (two) times daily. Take all of this medication -     triamterene-hydrochlorothiazide (MAXZIDE-25) 37.5-25 MG tablet; Take 1 tablet by mouth daily. For blood pressure and fluid       I have changed Toni Francis's DULoxetine. I am also having her start on amoxicillin-clavulanate  and triamterene-hydrochlorothiazide. Additionally, I am having her maintain her Multiple Vitamins-Minerals (CENTRUM SILVER PO), Omega-3 Fatty Acids (FISH OIL PO), fluticasone, and meloxicam.  Allergies as of 01/18/2019      Reactions   Ampicillin Rash   Codeine Nausea And Vomiting, Other (See Comments)   Also causes headaches   Pseudoephedrine Palpitations      Medication List       Accurate as of January 18, 2019  3:33 PM. If you have any questions, ask your nurse or doctor.        STOP taking these medications   predniSONE 10 MG tablet Commonly known as: DELTASONE Stopped by: Stone, [name], LPN     TAKE these medications   amoxicillin-clavulanate 875-125 MG tablet Commonly known as: AUGMENTIN Take 1 tablet by mouth 2 (two) times daily. Take all of this medication Started by: Warren Stacks, MD   CENTRUM SILVER PO Take 1 tablet by mouth daily.   DULoxetine 30 MG capsule Commonly known as: CYMBALTA Take 1 capsule (30 mg total) by mouth daily.   FISH OIL PO Take 1,400 mg by mouth daily.   fluticasone 50 MCG/ACT nasal spray Commonly known as: FLONASE USE TWO SPRAYS IN EACH NOSTRIL ONCE to twice  DAILY   meloxicam 15 MG tablet Commonly known as: MOBIC Take 1 tablet (15 mg total) by mouth daily.   triamterene-hydrochlorothiazide 37.5-25 MG tablet Commonly known as: MAXZIDE-25 Take 1 tablet by mouth daily. For blood pressure and fluid Started by: Warren Stacks, MD      Take your pain meds on schedule - daily. Wil help with pain and decrease side effects. I recommend a trial at least with the spinal stimulator  Follow-up: Return in about 6 months (around 07/18/2019), or if symptoms worsen or fail to improve.  Warren Stacks, M.D. 

## 2019-01-19 LAB — CMP14+EGFR
ALT: 23 IU/L (ref 0–32)
AST: 23 IU/L (ref 0–40)
Albumin/Globulin Ratio: 1.7 (ref 1.2–2.2)
Albumin: 3.5 g/dL — ABNORMAL LOW (ref 3.8–4.8)
Alkaline Phosphatase: 70 IU/L (ref 39–117)
BUN/Creatinine Ratio: 14 (ref 12–28)
BUN: 10 mg/dL (ref 8–27)
Bilirubin Total: 0.4 mg/dL (ref 0.0–1.2)
CO2: 25 mmol/L (ref 20–29)
Calcium: 9.6 mg/dL (ref 8.7–10.3)
Chloride: 107 mmol/L — ABNORMAL HIGH (ref 96–106)
Creatinine, Ser: 0.74 mg/dL (ref 0.57–1.00)
GFR calc Af Amer: 100 mL/min/{1.73_m2} (ref >59)
GFR calc non Af Amer: 87 mL/min/{1.73_m2} (ref >59)
Globulin, Total: 2.1 g/dL (ref 1.5–4.5)
Glucose: 92 mg/dL (ref 65–99)
Potassium: 4.6 mmol/L (ref 3.5–5.2)
Sodium: 145 mmol/L — ABNORMAL HIGH (ref 134–144)
Total Protein: 5.6 g/dL — ABNORMAL LOW (ref 6.0–8.5)

## 2019-01-19 LAB — CBC WITH DIFFERENTIAL/PLATELET
Basophils Absolute: 0.1 10*3/uL (ref 0.0–0.2)
Basos: 1 %
EOS (ABSOLUTE): 0.1 10*3/uL (ref 0.0–0.4)
Eos: 2 %
Hematocrit: 42.3 % (ref 34.0–46.6)
Hemoglobin: 14.5 g/dL (ref 11.1–15.9)
Immature Grans (Abs): 0 10*3/uL (ref 0.0–0.1)
Immature Granulocytes: 1 %
Lymphocytes Absolute: 1.8 10*3/uL (ref 0.7–3.1)
Lymphs: 22 %
MCH: 29.8 pg (ref 26.6–33.0)
MCHC: 34.3 g/dL (ref 31.5–35.7)
MCV: 87 fL (ref 79–97)
Monocytes Absolute: 0.7 10*3/uL (ref 0.1–0.9)
Monocytes: 8 %
Neutrophils Absolute: 5.4 10*3/uL (ref 1.4–7.0)
Neutrophils: 66 %
Platelets: 226 10*3/uL (ref 150–450)
RBC: 4.87 x10E6/uL (ref 3.77–5.28)
RDW: 11.9 % (ref 11.7–15.4)
WBC: 8.2 10*3/uL (ref 3.4–10.8)

## 2019-01-19 LAB — BRAIN NATRIURETIC PEPTIDE: BNP: 39.1 pg/mL (ref 0.0–100.0)

## 2019-01-22 ENCOUNTER — Telehealth: Payer: Self-pay | Admitting: Family Medicine

## 2019-01-22 NOTE — Telephone Encounter (Signed)
Please review labs from 01/18/19 and advise.

## 2019-02-08 ENCOUNTER — Other Ambulatory Visit: Payer: Self-pay

## 2019-02-08 ENCOUNTER — Ambulatory Visit (INDEPENDENT_AMBULATORY_CARE_PROVIDER_SITE_OTHER): Payer: BC Managed Care – PPO

## 2019-02-08 DIAGNOSIS — Z23 Encounter for immunization: Secondary | ICD-10-CM | POA: Diagnosis not present

## 2019-04-23 ENCOUNTER — Ambulatory Visit (INDEPENDENT_AMBULATORY_CARE_PROVIDER_SITE_OTHER): Payer: BC Managed Care – PPO | Admitting: Nurse Practitioner

## 2019-04-23 DIAGNOSIS — J0101 Acute recurrent maxillary sinusitis: Secondary | ICD-10-CM

## 2019-04-23 MED ORDER — DOXYCYCLINE HYCLATE 100 MG PO TABS: 100.0000 mg | ORAL_TABLET | Freq: Two times a day (BID) | ORAL | 0 refills | Status: DC

## 2019-04-23 NOTE — Progress Notes (Signed)
Virtual Visit via telephone Note Due to COVID-19 pandemic this visit was conducted virtually. This visit type was conducted due to national recommendations for restrictions regarding the COVID-19 Pandemic (e.g. social distancing, sheltering in place) in an effort to limit this patient's exposure and mitigate transmission in our community. All issues noted in this document were discussed and addressed.  A physical exam was not performed with this format.  I connected with Toni Francis on 04/23/19 at 10:00 by telephone and verified that I am speaking with the correct person using two identifiers. Toni Francis is currently located at home and no one is currently with her during visit. The provider, Mary-Margaret Hassell Done, FNP is located in their office at time of visit.  I discussed the limitations, risks, security and privacy concerns of performing an evaluation and management service by telephone and the availability of in person appointments. I also discussed with the patient that there may be a patient responsible charge related to this service. The patient expressed understanding and agreed to proceed.   History and Present Illness:   Chief Complaint: Sinusitis   HPI Patient calls in c/o sinus congestion with facial pain and slight cough. She denies any fever or body aches. Sinus pressure has gotten worse the last several days  She has had some chills but no fever.     Review of Systems  Constitutional: Positive for chills. Negative for fever.  HENT: Positive for congestion, sinus pain and sore throat.   Respiratory: Positive for cough (slight).   Cardiovascular: Negative.   Musculoskeletal: Negative.   Neurological: Positive for headaches.  Psychiatric/Behavioral: Negative.   All other systems reviewed and are negative.    Observations/Objective: Alert and oriented- answers all questions appropriately No distress Voice sounds nasal No cough nited  Assessment and  Plan: Toni Francis in today with chief complaint of Sinusitis   1. Acute recurrent maxillary sinusitis 1. Take meds as prescribed 2. Use a cool mist humidifier especially during the winter months and when heat has been humid. 3. Use saline nose sprays frequently 4. Saline irrigations of the nose can be very helpful if done frequently.  * 4X daily for 1 week*  * Use of a nettie pot can be helpful with this. Follow directions with this* 5. Drink plenty of fluids 6. Keep thermostat turn down low 7.For any cough or congestion  Use plain Mucinex- regular strength or max strength is fine   * Children- consult with Pharmacist for dosing 8. For fever or aces or pains- take tylenol or ibuprofen appropriate for age and weight.  * for fevers greater than 101 orally you may alternate ibuprofen and tylenol every  3 hours.   Meds ordered this encounter  Medications  . doxycycline (VIBRA-TABS) 100 MG tablet    Sig: Take 1 tablet (100 mg total) by mouth 2 (two) times daily. 1 po bid    Dispense:  14 tablet    Refill:  0    Order Specific Question:   Supervising Provider    Answer:   Caryl Pina A [id number]      Follow Up Instructions: prn    I discussed the assessment and treatment plan with the patient. The patient was provided an opportunity to ask questions and all were answered. The patient agreed with the plan and demonstrated an understanding of the instructions.   The patient was advised to call back or seek an in-person evaluation if the symptoms worsen or if the condition  fails to improve as anticipated.  The above assessment and management plan was discussed with the patient. The patient verbalized understanding of and has agreed to the management plan. Patient is aware to call the clinic if symptoms persist or worsen. Patient is aware when to return to the clinic for a follow-up visit. Patient educated on when it is appropriate to go to the emergency department.   Time call  ended:  10:15  I provided 15 minutes of non-face-to-face time during this encounter.    Mary-Margaret Hassell Done, FNP

## 2019-05-25 ENCOUNTER — Ambulatory Visit: Payer: BC Managed Care – PPO | Admitting: Family Medicine

## 2019-05-25 ENCOUNTER — Encounter: Payer: Self-pay | Admitting: Family Medicine

## 2019-05-25 ENCOUNTER — Other Ambulatory Visit: Payer: Self-pay

## 2019-05-25 VITALS — BP 133/86 | HR 72 | Temp 98.7°F | Ht 67.0 in | Wt 263.4 lb

## 2019-05-25 DIAGNOSIS — I10 Essential (primary) hypertension: Secondary | ICD-10-CM | POA: Diagnosis not present

## 2019-05-25 DIAGNOSIS — M1990 Unspecified osteoarthritis, unspecified site: Secondary | ICD-10-CM

## 2019-05-25 DIAGNOSIS — J0111 Acute recurrent frontal sinusitis: Secondary | ICD-10-CM

## 2019-05-25 MED ORDER — TRIAMTERENE-HCTZ 37.5-25 MG PO TABS: 1.0000 | ORAL_TABLET | Freq: Every day | ORAL | 3 refills | Status: DC

## 2019-05-25 MED ORDER — MELOXICAM 15 MG PO TABS: 15.0000 mg | ORAL_TABLET | Freq: Every day | ORAL | 1 refills | Status: DC

## 2019-05-25 MED ORDER — PREDNISONE 10 MG PO TABS: ORAL_TABLET | ORAL | 0 refills | Status: DC

## 2019-05-25 MED ORDER — LEVOCETIRIZINE DIHYDROCHLORIDE 5 MG PO TABS: 5.0000 mg | ORAL_TABLET | Freq: Every evening | ORAL | 1 refills | Status: DC

## 2019-05-25 MED ORDER — MOXIFLOXACIN HCL 400 MG PO TABS: 400.0000 mg | ORAL_TABLET | Freq: Every day | ORAL | 0 refills | Status: DC

## 2019-05-25 NOTE — Patient Instructions (Signed)
Only use afrin 3-4 days at most for any episode of illness.

## 2019-05-25 NOTE — Progress Notes (Signed)
Chief Complaint  Patient presents with  . Cyst    Throat    HPI  Patient presents today for Symptoms include congestion, facial pain, nasal congestion, non productive cough, post nasal drip and sinus pressure. There is no fever, chills, or sweats. Onset of symptoms was a few days ago, gradually worsening since that time.  This is actually recurrent from some sore throat and sinus from 2 to 3 months ago.  He was also seen about a month ago by Ms. Hassell Done, nurse practitioner.  Symptoms have recurred after each treatment.  Of note is that she has been using Zyrtec she symptoms and that has not helped at all.  Follow-up of hypertension. Patient has no history of headache chest pain or shortness of breath or recent cough. Patient also denies symptoms of TIA such as numbness weakness lateralizing. Patient checks  blood pressure at home and has not had any elevated readings recently. Patient denies side effects from his medication. States taking it regularly.   PMH: Smoking status noted ROS: Review of Systems  Constitutional: Negative.  Negative for activity change, appetite change, chills and fever.  HENT: Negative.  Negative for ear discharge, ear pain, hearing loss, nosebleeds, sneezing and trouble swallowing.   Eyes: Negative for visual disturbance.  Respiratory: Negative for chest tightness and shortness of breath.   Cardiovascular: Negative for chest pain and palpitations.  Gastrointestinal: Negative for abdominal pain.  Musculoskeletal: Positive for arthralgias (She has had back pain and foot pain.  Good relief with Mobic.).  Skin: Negative for rash.     Objective: BP 133/86   Pulse 72   Temp 98.7 F (37.1 C) (Temporal)   Ht 5\' 7"  (1.702 m)   Wt 263 lb 6.4 oz (119.5 kg)   BMI 41.25 kg/m  Gen: NAD, alert, cooperative with exam HEENT: NCAT, EOMI, PERRL.  There is mild erythema in the's.  There is erythema and edema in the passages. CV: RRR, good S1/S2, no murmur Resp: CTABL, no  wheezes, non-labored Abd: SNTND, BS present, no guarding or organomegaly Ext: No edema, warm Neuro: Alert and oriented, No gross deficits  Assessment and plan:  1. Arthritis   2. Acute recurrent frontal sinusitis   3. Essential hypertension     Meds ordered this encounter  Medications  . moxifloxacin (AVELOX) 400 MG tablet    Sig: Take 1 tablet (400 mg total) by mouth daily. Take all of these, for infection    Dispense:  10 tablet    Refill:  0  . predniSONE (DELTASONE) 10 MG tablet    Sig: Take 5 daily for 3 days followed by 4,3,2 and 1 for 3 days each.    Dispense:  45 tablet    Refill:  0  . levocetirizine (XYZAL) 5 MG tablet    Sig: Take 1 tablet (5 mg total) by mouth every evening. For allergy    Dispense:  90 tablet    Refill:  1  . triamterene-hydrochlorothiazide (MAXZIDE-25) 37.5-25 MG tablet    Sig: Take 1 tablet by mouth daily. For blood pressure and fluid    Dispense:  90 tablet    Refill:  3  . meloxicam (MOBIC) 15 MG tablet    Sig: Take 1 tablet (15 mg total) by mouth daily.    Dispense:  90 tablet    Refill:  1    Please consider 90 day supplies to promote better adherence    The patient was acute on chronic sinusitis was addressed.  This is treated by giving her a stronger antibiotic than usual along with a taper of prednisone.  Her antihistamine for the allergy component was upgraded to levocetirizine/Xyzal.  For her blood pressure she states she is doing quite well with the triamterene hydrochlorothiazide and that was renewed for her today.  Additionally she is doing well with her arthritic/joint pains using meloxicam daily.  That will be renewed as well.  When she is back in for her next checkup she will need CMP panel.  Follow up as needed.  Claretta Fraise, MD

## 2019-06-05 ENCOUNTER — Other Ambulatory Visit: Payer: Self-pay | Admitting: Family Medicine

## 2019-06-05 ENCOUNTER — Telehealth: Payer: Self-pay | Admitting: Family Medicine

## 2019-06-05 DIAGNOSIS — J392 Other diseases of pharynx: Secondary | ICD-10-CM

## 2019-06-05 NOTE — Telephone Encounter (Signed)
Patient aware.

## 2019-06-05 NOTE — Telephone Encounter (Signed)
I wrote a referral to Radene Journey, ENT

## 2019-06-06 ENCOUNTER — Telehealth: Payer: Self-pay | Admitting: Family Medicine

## 2019-06-06 NOTE — Telephone Encounter (Signed)
Patient states that she would like to go to [facility] in [place].

## 2019-06-11 ENCOUNTER — Encounter (INDEPENDENT_AMBULATORY_CARE_PROVIDER_SITE_OTHER): Payer: Self-pay | Admitting: Otolaryngology

## 2019-06-11 ENCOUNTER — Ambulatory Visit (INDEPENDENT_AMBULATORY_CARE_PROVIDER_SITE_OTHER): Payer: BC Managed Care – PPO | Admitting: Otolaryngology

## 2019-06-11 ENCOUNTER — Other Ambulatory Visit: Payer: Self-pay

## 2019-06-11 VITALS — Temp 97.7°F

## 2019-06-11 DIAGNOSIS — J310 Chronic rhinitis: Secondary | ICD-10-CM | POA: Diagnosis not present

## 2019-06-11 DIAGNOSIS — E041 Nontoxic single thyroid nodule: Secondary | ICD-10-CM

## 2019-06-11 NOTE — Progress Notes (Signed)
HPI: Toni Francis is a 64 y.o. female who presents is referred by her PCP for evaluation of complaints of chronic postnasal drainage with intermittent cough that is generally nonproductive.  She also complains of some nasal congestion and "sinus pressure".  She has been treated with antibiotics several times over the past couple months.  She was recently treated with Avelox and prednisone. She has history of allergies and is also on Flonase and Xyzal.  She also uses saline nasal irrigation.. She also noticed a nodule in the middle of her lower neck that apparently has gotten little bit smaller since she was treated with the steroids.  Past Medical History:  Diagnosis Date  . Pinched nerve    L4-5,back   Past Surgical History:  Procedure Laterality Date  . CHOLECYSTECTOMY     Social History   Socioeconomic History  . Marital status: Divorced    Spouse name: Not on file  . Number of children: 2  . Years of education: 60  . Highest education level: Some college, no degree  Occupational History    Employer: [employer]  Tobacco Use  . Smoking status: Never Smoker  . Smokeless tobacco: Never Used  Substance and Sexual Activity  . Alcohol use: No  . Drug use: No  . Sexual activity: Not on file  Other Topics Concern  . Not on file  Social History Narrative  . Not on file   Social Determinants of Health   Financial Resource Strain:   . Difficulty of Paying Living Expenses: Not on file  Food Insecurity:   . Worried About Charity fundraiser in the Last Year: Not on file  . Ran Out of Food in the Last Year: Not on file  Transportation Needs:   . Lack of Transportation (Medical): Not on file  . Lack of Transportation (Non-Medical): Not on file  Physical Activity:   . Days of Exercise per Week: Not on file  . Minutes of Exercise per Session: Not on file  Stress:   . Feeling of Stress : Not on file  Social Connections:   . Frequency of Communication with Friends  and Family: Not on file  . Frequency of Social Gatherings with Friends and Family: Not on file  . Attends Religious Services: Not on file  . Active Member of Clubs or Organizations: Not on file  . Attends Archivist Meetings: Not on file  . Marital Status: Not on file   Family History  Problem Relation Age of Onset  . Colon cancer Maternal Uncle 50   Allergies  Allergen Reactions  . Ampicillin Rash  . Codeine Nausea And Vomiting and Other (See Comments)    Also causes headaches   . Pseudoephedrine Palpitations   Prior to Admission medications   Medication Sig Start Date End Date Taking? Authorizing Provider  DULoxetine (CYMBALTA) 30 MG capsule Take 1 capsule (30 mg total) by mouth daily. 01/18/19  Yes Stacks, Cletus Gash, MD  fluticasone [facility]) 50 MCG/ACT nasal spray USE TWO SPRAYS IN EACH NOSTRIL ONCE to twice  DAILY 07/08/18  Yes Chipper Herb, MD  levocetirizine (XYZAL) 5 MG tablet Take 1 tablet (5 mg total) by mouth every evening. For allergy 05/25/19  Yes Stacks, Cletus Gash, MD  meloxicam (MOBIC) 15 MG tablet Take 1 tablet (15 mg total) by mouth daily. 05/25/19  Yes Stacks, Cletus Gash, MD  moxifloxacin (AVELOX) 400 MG tablet Take 1 tablet (400 mg total) by mouth daily. Take all of these, for  infection 05/25/19  Yes Stacks, Cletus Gash, MD  Multiple Vitamins-Minerals (CENTRUM SILVER PO) Take 1 tablet by mouth daily.   Yes [provider]  Omega-3 Fatty Acids (FISH OIL PO) Take 1,400 mg by mouth daily.   Yes [provider]  triamterene-hydrochlorothiazide (MAXZIDE-25) 37.5-25 MG tablet Take 1 tablet by mouth daily. For blood pressure and fluid 05/25/19  Yes Stacks, Cletus Gash, MD  predniSONE (DELTASONE) 10 MG tablet Take 5 daily for 3 days followed by 4,3,2 and 1 for 3 days each. Patient not taking: Reported on 06/11/2019 05/25/19   Claretta Fraise, MD     Positive ROS: Otherwise negative  All other systems have been reviewed and were otherwise negative with the exception of  those mentioned in the HPI and as above.  Physical Exam: Constitutional: Alert, well-appearing, no acute distress Ears: External ears without lesions or tenderness. Ear canals are clear bilaterally with intact, clear TMs.  Nasal: External nose without lesions. Septum with minimal deformity..  Both middle meatus regions are clear with no evidence of acute sinus infection.  No acute mucopurulent discharge.  No polyps.  Moderate rhinitis with clear mucus discharge. Oral: Lips and gums without lesions. Tongue and palate mucosa without lesions. Posterior oropharynx clear. Neck: The nodule she feels in her lower neck represents a thyroid nodule.  There is no supraclavicular or jugular adenopathy on palpation. Respiratory: Breathing comfortably  Skin: No facial/neck lesions or rash noted.  Procedures  Assessment: Chronic rhinitis New thyroid nodule  Plan: We will plan on scheduling patient for an ultrasound to evaluate the thyroid nodule. Concerning the postnasal drainage.  Agree with present therapy.  Reviewed with her concerning regular use of Flonase 2 sprays each nostril at night.  Antihistamine such as Xyzal in the morning as needed and saline rinse. Presently there is no evidence of active sinus infection on clinical examination. She will call us following results of the ultrasound.   Radene Journey, MD   CC:

## 2019-06-12 ENCOUNTER — Other Ambulatory Visit (INDEPENDENT_AMBULATORY_CARE_PROVIDER_SITE_OTHER): Payer: Self-pay

## 2019-06-12 DIAGNOSIS — E041 Nontoxic single thyroid nodule: Secondary | ICD-10-CM

## 2019-06-13 ENCOUNTER — Ambulatory Visit (INDEPENDENT_AMBULATORY_CARE_PROVIDER_SITE_OTHER): Payer: BC Managed Care – PPO | Admitting: Otolaryngology

## 2019-06-21 ENCOUNTER — Ambulatory Visit
Admission: RE | Admit: 2019-06-21 | Discharge: 2019-06-21 | Disposition: A | Discharge status: Home / Self Care | Payer: BC Managed Care – PPO | Source: Ambulatory Visit | Attending: Otolaryngology | Admitting: Otolaryngology

## 2019-06-21 DIAGNOSIS — E041 Nontoxic single thyroid nodule: Secondary | ICD-10-CM

## 2019-06-23 ENCOUNTER — Ambulatory Visit: Payer: BC Managed Care – PPO | Attending: Internal Medicine

## 2019-06-23 DIAGNOSIS — Z23 Encounter for immunization: Secondary | ICD-10-CM | POA: Insufficient documentation

## 2019-06-23 NOTE — Progress Notes (Signed)
   Covid-19 Vaccination Clinic  Name:  Toni Francis    MRN: [id number] DOB: [birth year]  06/23/2019  [name] was observed post Covid-19 immunization for 15 minutes without incidence. She was provided with Vaccine Information Sheet and instruction to access the V-Safe system.   [name] was instructed to call 911 with any severe reactions post vaccine: Marland Kitchen Difficulty breathing  . Swelling of your face and throat  . A fast heartbeat  . A bad rash all over your body  . Dizziness and weakness    Immunizations Administered    Name Date Dose VIS Date Route   Pfizer COVID-19 Vaccine 06/23/2019  1:48 PM 0.3 mL 04/06/2019 Intramuscular   Manufacturer: [place]   Lot: [id number]   [place]: [id number]

## 2019-06-26 ENCOUNTER — Other Ambulatory Visit: Payer: Self-pay | Admitting: Otolaryngology

## 2019-06-27 ENCOUNTER — Other Ambulatory Visit (INDEPENDENT_AMBULATORY_CARE_PROVIDER_SITE_OTHER): Payer: Self-pay

## 2019-06-28 ENCOUNTER — Other Ambulatory Visit (INDEPENDENT_AMBULATORY_CARE_PROVIDER_SITE_OTHER): Payer: Self-pay

## 2019-06-28 DIAGNOSIS — E041 Nontoxic single thyroid nodule: Secondary | ICD-10-CM

## 2019-07-02 ENCOUNTER — Other Ambulatory Visit (INDEPENDENT_AMBULATORY_CARE_PROVIDER_SITE_OTHER): Payer: Self-pay | Admitting: Otolaryngology

## 2019-07-02 DIAGNOSIS — E041 Nontoxic single thyroid nodule: Secondary | ICD-10-CM

## 2019-07-03 ENCOUNTER — Other Ambulatory Visit (HOSPITAL_COMMUNITY)
Admission: RE | Admit: 2019-07-03 | Discharge: 2019-07-03 | Disposition: A | Discharge status: Home / Self Care | Payer: BC Managed Care – PPO | Source: Ambulatory Visit | Attending: Radiology | Admitting: Radiology

## 2019-07-03 ENCOUNTER — Ambulatory Visit
Admission: RE | Admit: 2019-07-03 | Discharge: 2019-07-03 | Disposition: A | Discharge status: Home / Self Care | Payer: BC Managed Care – PPO | Source: Ambulatory Visit | Attending: Otolaryngology | Admitting: Otolaryngology

## 2019-07-03 DIAGNOSIS — E041 Nontoxic single thyroid nodule: Secondary | ICD-10-CM | POA: Insufficient documentation

## 2019-07-06 ENCOUNTER — Telehealth (INDEPENDENT_AMBULATORY_CARE_PROVIDER_SITE_OTHER): Payer: Self-pay | Admitting: Otolaryngology

## 2019-07-06 NOTE — Telephone Encounter (Signed)
Called Toni Francis today concerning results of her ultrasound-guided fine-needle aspirate of the right thyroid nodules x2.  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 Discussed with patient concerning options of sending specimen for Afirma test and following these nodules with ultrasound and further FNA versus right thyroid lobectomy. I have recommended a right thyroid lobectomy and discussed this briefly with her. She will have to be out of work for 5 to 7 days. She will call us with a time to schedule the right thyroid lobectomy.  I want to see her a couple weeks before surgery to review surgery with her.

## 2019-07-14 ENCOUNTER — Ambulatory Visit: Payer: BC Managed Care – PPO | Attending: Internal Medicine

## 2019-07-14 DIAGNOSIS — Z23 Encounter for immunization: Secondary | ICD-10-CM

## 2019-07-14 NOTE — Progress Notes (Signed)
   Covid-19 Vaccination Clinic  Name:  CATARINA TIGUE    MRN: [id number] DOB: February 15, 1956  07/14/2019  Ms. Dalesio was observed post Covid-19 immunization for 15 minutes without incident. She was provided with Vaccine Information Sheet and instruction to access the V-Safe system.   Ms. Frutos was instructed to call 911 with any severe reactions post vaccine: Marland Kitchen Difficulty breathing  . Swelling of face and throat  . A fast heartbeat  . A bad rash all over body  . Dizziness and weakness   Immunizations Administered    Name Date Dose VIS Date Route   Pfizer COVID-19 Vaccine 07/14/2019 11:10 AM 0.3 mL 04/06/2019 Intramuscular   Manufacturer: [place]   Lot: [id number]   [place]: [id number]

## 2019-07-20 ENCOUNTER — Encounter (HOSPITAL_COMMUNITY): Payer: Self-pay

## 2019-08-06 ENCOUNTER — Ambulatory Visit (INDEPENDENT_AMBULATORY_CARE_PROVIDER_SITE_OTHER): Payer: Self-pay | Admitting: Otolaryngology

## 2019-08-06 DIAGNOSIS — D497 Neoplasm of unspecified behavior of endocrine glands and other parts of nervous system: Secondary | ICD-10-CM

## 2019-08-06 LAB — CYTOLOGY - NON PAP

## 2019-08-06 NOTE — H&P (Signed)
PREOPERATIVE H&P  Chief Complaint: Right neck nodule  HPI: Toni Francis is a 64 y.o. female who presents for evaluation of right neck nodule.  Thyroid ultrasound demonstrated 2 nodules in the right lobe of the thyroid.  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  She is taken to the operating room at this time for right thyroid lobectomy.  Past Medical History:  Diagnosis Date  . Pinched nerve    L4-5,back   Past Surgical History:  Procedure Laterality Date  . CHOLECYSTECTOMY     Social History   Socioeconomic History  . Marital status: Divorced    Spouse name: Not on file  . Number of children: 2  . Years of education: 81  . Highest education level: Some college, no degree  Occupational History    Employer: [employer]  Tobacco Use  . Smoking status: Never Smoker  . Smokeless tobacco: Never Used  Substance and Sexual Activity  . Alcohol use: No  . Drug use: No  . Sexual activity: Not on file  Other Topics Concern  . Not on file  Social History Narrative  . Not on file   Social Determinants of Health   Financial Resource Strain:   . Difficulty of Paying Living Expenses:   Food Insecurity:   . Worried About Charity fundraiser in the Last Year:   . Arboriculturist in the Last Year:   Transportation Needs:   . Film/video editor (Medical):   Marland Kitchen Lack of Transportation (Non-Medical):   Physical Activity:   . Days of Exercise per Week:   . Minutes of Exercise per Session:   Stress:   . Feeling of Stress :   Social Connections:   . Frequency of Communication with Friends and Family:   . Frequency of Social Gatherings with Friends and Family:   . Attends Religious Services:   . Active Member of Clubs or Organizations:   . Attends Archivist Meetings:   Marland Kitchen Marital Status:    Family History  Problem Relation Age of Onset  . Colon cancer Maternal Uncle  50   Allergies  Allergen Reactions  . Ampicillin Rash    Did it involve swelling of the face/tongue/throat, SOB, or low BP? No Did it involve sudden or severe rash/hives, skin peeling, or any reaction on the inside of your mouth or nose? Yes Did you need to seek medical attention at a hospital or doctor's office? Yes When did it last happen?10 + years If all above answers are "NO", may proceed with cephalosporin use.   . Codeine Nausea And Vomiting and Other (See Comments)    Also causes headaches   . Pseudoephedrine Palpitations   Prior to Admission medications   Medication Sig Start Date End Date Taking? Authorizing Provider  acetaminophen (TYLENOL) 500 MG tablet Take 1,000 mg by mouth daily as needed for moderate pain or headache.    [provider]  Calcium Citrate-Vitamin D (CALCIUM + D PO) Take 1 tablet by mouth daily.    [provider]  DULoxetine (CYMBALTA) 30 MG capsule Take 1 capsule (30 mg total) by mouth daily. Patient not taking: Reported on 07/30/2019 01/18/19   Claretta Fraise, MD  fluticasone [facility]) 50 MCG/ACT nasal spray USE TWO SPRAYS IN EACH NOSTRIL ONCE to twice  DAILY Patient taking differently: Place 1 spray into both nostrils every evening.  07/08/18   Laurance Flatten,  Estella Husk, MD  levocetirizine (XYZAL) 5 MG tablet Take 1 tablet (5 mg total) by mouth every evening. For allergy 05/25/19   Claretta Fraise, MD  meloxicam (MOBIC) 15 MG tablet Take 1 tablet (15 mg total) by mouth daily. Patient taking differently: Take 15 mg by mouth every evening.  05/25/19   Claretta Fraise, MD  moxifloxacin (AVELOX) 400 MG tablet Take 1 tablet (400 mg total) by mouth daily. Take all of these, for infection Patient not taking: Reported on 07/30/2019 05/25/19   Claretta Fraise, MD  Multiple Vitamins-Minerals (CENTRUM SILVER PO) Take 1 tablet by mouth daily.    [provider]  Multiple Vitamins-Minerals (EYE VITAMINS PO) Take 1 capsule by mouth daily.    [provider]  Omega-3 Fatty Acids (FISH OIL PO) Take 1 capsule by mouth daily.     [provider]  omeprazole (PRILOSEC) 20 MG capsule Take 20 mg by mouth every evening.    [provider]  predniSONE (DELTASONE) 10 MG tablet Take 5 daily for 3 days followed by 4,3,2 and 1 for 3 days each. Patient not taking: Reported on 06/11/2019 05/25/19   Claretta Fraise, MD  triamterene-hydrochlorothiazide (MAXZIDE-25) 37.5-25 MG tablet Take 1 tablet by mouth daily. For blood pressure and fluid 05/25/19   Claretta Fraise, MD     Positive ROS: Otherwise negative  All other systems have been reviewed and were otherwise negative with the exception of those mentioned in the HPI and as above.  Physical Exam: There were no vitals filed for this visit.  General: Alert, no acute distress.  She has no hoarseness. Oral: Normal oral mucosa and tonsils Nasal: Clear nasal passages Neck: No palpable adenopathy.  No supra clavicular adenopathy noted.  She has a palpable right thyroid nodule. Ear: Ear canal is clear with normal appearing TMs Cardiovascular: Regular rate and rhythm, no murmur.  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  Plan for  Right thyroid lobectomy.   Melony Overly, MD 08/06/2019 6:21 PM

## 2019-08-06 NOTE — H&P (View-Only) (Signed)
PREOPERATIVE H&P  Chief Complaint: Right neck nodule  HPI: Toni Francis is a 64 y.o. female who presents for evaluation of right neck nodule.  Thyroid ultrasound demonstrated 2 nodules in the right lobe of the thyroid.  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  She is taken to the operating room at this time for right thyroid lobectomy.  Past Medical History:  Diagnosis Date  . Pinched nerve    L4-5,back   Past Surgical History:  Procedure Laterality Date  . CHOLECYSTECTOMY     Social History   Socioeconomic History  . Marital status: Divorced    Spouse name: Not on file  . Number of children: 2  . Years of education: 34  . Highest education level: Some college, no degree  Occupational History    Employer: [employer]  Tobacco Use  . Smoking status: Never Smoker  . Smokeless tobacco: Never Used  Substance and Sexual Activity  . Alcohol use: No  . Drug use: No  . Sexual activity: Not on file  Other Topics Concern  . Not on file  Social History Narrative  . Not on file   Social Determinants of Health   Financial Resource Strain:   . Difficulty of Paying Living Expenses:   Food Insecurity:   . Worried About Charity fundraiser in the Last Year:   . Arboriculturist in the Last Year:   Transportation Needs:   . Film/video editor (Medical):   Marland Kitchen Lack of Transportation (Non-Medical):   Physical Activity:   . Days of Exercise per Week:   . Minutes of Exercise per Session:   Stress:   . Feeling of Stress :   Social Connections:   . Frequency of Communication with Friends and Family:   . Frequency of Social Gatherings with Friends and Family:   . Attends Religious Services:   . Active Member of Clubs or Organizations:   . Attends Archivist Meetings:   Marland Kitchen Marital Status:    Family History  Problem Relation Age of Onset  . Colon cancer Maternal Uncle  50   Allergies  Allergen Reactions  . Ampicillin Rash    Did it involve swelling of the face/tongue/throat, SOB, or low BP? No Did it involve sudden or severe rash/hives, skin peeling, or any reaction on the inside of your mouth or nose? Yes Did you need to seek medical attention at a hospital or doctor's office? Yes When did it last happen?10 + years If all above answers are "NO", may proceed with cephalosporin use.   . Codeine Nausea And Vomiting and Other (See Comments)    Also causes headaches   . Pseudoephedrine Palpitations   Prior to Admission medications   Medication Sig Start Date End Date Taking? Authorizing Provider  acetaminophen (TYLENOL) 500 MG tablet Take 1,000 mg by mouth daily as needed for moderate pain or headache.    [provider]  Calcium Citrate-Vitamin D (CALCIUM + D PO) Take 1 tablet by mouth daily.    [provider]  DULoxetine (CYMBALTA) 30 MG capsule Take 1 capsule (30 mg total) by mouth daily. Patient not taking: Reported on 07/30/2019 01/18/19   Claretta Fraise, MD  fluticasone [facility]) 50 MCG/ACT nasal spray USE TWO SPRAYS IN EACH NOSTRIL ONCE to twice  DAILY Patient taking differently: Place 1 spray into both nostrils every evening.  07/08/18   Laurance Flatten,  Estella Husk, MD  levocetirizine (XYZAL) 5 MG tablet Take 1 tablet (5 mg total) by mouth every evening. For allergy 05/25/19   Claretta Fraise, MD  meloxicam (MOBIC) 15 MG tablet Take 1 tablet (15 mg total) by mouth daily. Patient taking differently: Take 15 mg by mouth every evening.  05/25/19   Claretta Fraise, MD  moxifloxacin (AVELOX) 400 MG tablet Take 1 tablet (400 mg total) by mouth daily. Take all of these, for infection Patient not taking: Reported on 07/30/2019 05/25/19   Claretta Fraise, MD  Multiple Vitamins-Minerals (CENTRUM SILVER PO) Take 1 tablet by mouth daily.    [provider]  Multiple Vitamins-Minerals (EYE VITAMINS PO) Take 1 capsule by mouth daily.    [provider]  Omega-3 Fatty Acids (FISH OIL PO) Take 1 capsule by mouth daily.     [provider]  omeprazole (PRILOSEC) 20 MG capsule Take 20 mg by mouth every evening.    [provider]  predniSONE (DELTASONE) 10 MG tablet Take 5 daily for 3 days followed by 4,3,2 and 1 for 3 days each. Patient not taking: Reported on 06/11/2019 05/25/19   Claretta Fraise, MD  triamterene-hydrochlorothiazide (MAXZIDE-25) 37.5-25 MG tablet Take 1 tablet by mouth daily. For blood pressure and fluid 05/25/19   Claretta Fraise, MD     Positive ROS: Otherwise negative  All other systems have been reviewed and were otherwise negative with the exception of those mentioned in the HPI and as above.  Physical Exam: There were no vitals filed for this visit.  General: Alert, no acute distress.  She has no hoarseness. Oral: Normal oral mucosa and tonsils Nasal: Clear nasal passages Neck: No palpable adenopathy.  No supra clavicular adenopathy noted.  She has a palpable right thyroid nodule. Ear: Ear canal is clear with normal appearing TMs Cardiovascular: Regular rate and rhythm, no murmur.  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  Plan for  Right thyroid lobectomy.   Melony Overly, MD 08/06/2019 6:21 PM

## 2019-08-08 NOTE — Progress Notes (Signed)
[facility] [address] [place] [address] [place] [address] Phone: [phone] Fax: [phone]  [place], [place] [address] Phone: [phone] Fax: [phone]      Your procedure is scheduled on Monday 08/13/2019.  Report to [facility] Main Entrance "A" at 05:30 A.M., and check in at the Admitting office.  Call this number if you have problems the morning of surgery:  [phone]  Call [phone] if you have any questions prior to your surgery date Monday-Friday 8am-4pm    Remember:  Do not eat or drink after midnight the night before your surgery    Take these medicines the morning of surgery with A SIP OF WATER: Acetaminophen (Tylenol) - if needed  As of today, STOP taking any Aspirin (unless otherwise instructed by your surgeon) and Aspirin containing products, Aleve, Naproxen, Ibuprofen, Motrin, Advil, Goody's, BC's, all herbal medications, fish oil, and all vitamins.                      Do not wear jewelry, make up, or nail polish            Do not wear lotions, powders, perfumes, or deodorant.            Do not shave 48 hours prior to surgery.  Men may shave face and neck.            Do not bring valuables to the hospital.            [facility] is not responsible for any belongings or valuables.  Do NOT Smoke (Tobacco/Vapping) or drink Alcohol 24 hours prior to your procedure  If you use a CPAP at night, you may bring all equipment for your overnight stay.   Contacts, glasses, dentures or bridgework may not be worn into surgery.      For patients admitted to the hospital, discharge time will be determined by your treatment team.   Patients discharged the day of surgery will not be allowed to drive home, and someone needs to stay with them for 24 hours.    Special instructions:   [facility]- Preparing For Surgery  Before surgery, you can play an  important role. Because skin is not sterile, your skin needs to be as free of germs as possible. You can reduce the number of germs on your skin by washing with CHG (chlorahexidine gluconate) Soap before surgery.  CHG is an antiseptic cleaner which kills germs and bonds with the skin to continue killing germs even after washing.    Oral Hygiene is also important to reduce your risk of infection.  Remember - BRUSH YOUR TEETH THE MORNING OF SURGERY WITH YOUR REGULAR TOOTHPASTE  Please do not use if you have an allergy to CHG or antibacterial soaps. If your skin becomes reddened/irritated stop using the CHG.  Do not shave (including legs and underarms) for at least 48 hours prior to first CHG shower. It is OK to shave your face.  Please follow these instructions carefully.   1. Shower the NIGHT BEFORE SURGERY and the MORNING OF SURGERY with CHG Soap.   2. If you chose to wash your hair, wash your hair first as usual with your normal shampoo.  3. After you shampoo, rinse your hair and body thoroughly to remove the shampoo.  4. Use CHG as you would any other liquid soap. You  can apply CHG directly to the skin and wash gently with a scrungie or a clean washcloth.   5. Apply the CHG Soap to your body ONLY FROM THE NECK DOWN.  Do not use on open wounds or open sores. Avoid contact with your eyes, ears, mouth and genitals (private parts). Wash Face and genitals (private parts)  with your normal soap.   6. Wash thoroughly, paying special attention to the area where your surgery will be performed.  7. Thoroughly rinse your body with warm water from the neck down.  8. DO NOT shower/wash with your normal soap after using and rinsing off the CHG Soap.  9. Pat yourself dry with a CLEAN TOWEL.  10. Wear CLEAN PAJAMAS to bed the night before surgery, wear comfortable clothes the morning of surgery  11. Place CLEAN SHEETS on your bed the night of your first shower and DO NOT SLEEP WITH PETS.   Day of  Surgery:   Do not apply any deodorants/lotions.  Please wear clean clothes to the hospital/surgery center.   Remember to brush your teeth WITH YOUR REGULAR TOOTHPASTE.   Please read over the following fact sheets that you were given.

## 2019-08-09 ENCOUNTER — Other Ambulatory Visit (HOSPITAL_COMMUNITY)
Admission: RE | Admit: 2019-08-09 | Discharge: 2019-08-09 | Disposition: A | Discharge status: Home / Self Care | Payer: BC Managed Care – PPO | Source: Ambulatory Visit | Attending: Surgery | Admitting: Surgery

## 2019-08-09 ENCOUNTER — Other Ambulatory Visit: Payer: Self-pay

## 2019-08-09 ENCOUNTER — Encounter (HOSPITAL_COMMUNITY)
Admission: RE | Admit: 2019-08-09 | Discharge: 2019-08-09 | Disposition: A | Discharge status: Home / Self Care | Payer: BC Managed Care – PPO | Source: Ambulatory Visit | Attending: Otolaryngology | Admitting: Otolaryngology

## 2019-08-09 ENCOUNTER — Encounter (HOSPITAL_COMMUNITY): Payer: Self-pay

## 2019-08-09 DIAGNOSIS — Z01818 Encounter for other preprocedural examination: Secondary | ICD-10-CM | POA: Insufficient documentation

## 2019-08-09 DIAGNOSIS — Z20822 Contact with and (suspected) exposure to covid-19: Secondary | ICD-10-CM | POA: Insufficient documentation

## 2019-08-09 HISTORY — DX: Gastro-esophageal reflux disease without esophagitis: K21.9

## 2019-08-09 HISTORY — DX: Essential (primary) hypertension: I10

## 2019-08-09 HISTORY — DX: Unspecified osteoarthritis, unspecified site: M19.90

## 2019-08-09 HISTORY — DX: Unspecified abdominal hernia without obstruction or gangrene: K46.9

## 2019-08-09 LAB — CBC
HCT: 42.1 % (ref 36.0–46.0)
Hemoglobin: 13.4 g/dL (ref 12.0–15.0)
MCH: 28.8 pg (ref 26.0–34.0)
MCHC: 31.8 g/dL (ref 30.0–36.0)
MCV: 90.3 fL (ref 80.0–100.0)
Platelets: 219 10*3/uL (ref 150–400)
RBC: 4.66 MIL/uL (ref 3.87–5.11)
RDW: 13 % (ref 11.5–15.5)
WBC: 6.8 10*3/uL (ref 4.0–10.5)
nRBC: 0 % (ref 0.0–0.2)

## 2019-08-09 LAB — SARS CORONAVIRUS 2 (TAT 6-24 HRS): SARS Coronavirus 2: NEGATIVE

## 2019-08-09 LAB — BASIC METABOLIC PANEL
Anion gap: 9 (ref 5–15)
BUN: 21 mg/dL (ref 8–23)
CO2: 23 mmol/L (ref 22–32)
Calcium: 9.3 mg/dL (ref 8.9–10.3)
Chloride: 108 mmol/L (ref 98–111)
Creatinine, Ser: 1 mg/dL (ref 0.44–1.00)
GFR calc Af Amer: >60 mL/min (ref >60)
GFR calc non Af Amer: >60 mL/min (ref >60)
Glucose, Bld: 114 mg/dL — ABNORMAL HIGH (ref 70–99)
Potassium: 4.5 mmol/L (ref 3.5–5.1)
Sodium: 140 mmol/L (ref 135–145)

## 2019-08-09 NOTE — Progress Notes (Signed)
PCP - Cletus Gash Stacks @ [facility] Cardiologist - na   Chest x-ray - na EKG - 08/09/19 Stress Test - yrs. Ago--normal ECHO - na Cardiac Cath - na  Sleep Study - na CPAP -     Blood Thinner Instructions:na Aspirin Instructions:    COVID TEST- 08/09/19   Anesthesia review:   Patient denies shortness of breath, fever, cough and chest pain at PAT appointment   All instructions explained to the patient, with a verbal understanding of the material. Patient agrees to go over the instructions while at home for a better understanding. Patient also instructed to self quarantine after being tested for COVID-19. The opportunity to ask questions was provided.

## 2019-08-13 ENCOUNTER — Other Ambulatory Visit: Payer: Self-pay

## 2019-08-13 ENCOUNTER — Ambulatory Visit (HOSPITAL_COMMUNITY): Payer: BC Managed Care – PPO | Admitting: Anesthesiology

## 2019-08-13 ENCOUNTER — Encounter (HOSPITAL_COMMUNITY): Payer: Self-pay | Admitting: Otolaryngology

## 2019-08-13 ENCOUNTER — Encounter (HOSPITAL_COMMUNITY)
Admission: RE | Disposition: A | Discharge status: Home / Self Care | Payer: Self-pay | Source: Ambulatory Visit | Attending: Otolaryngology

## 2019-08-13 ENCOUNTER — Observation Stay (HOSPITAL_COMMUNITY)
Admission: RE | Admit: 2019-08-13 | Discharge: 2019-08-14 | Disposition: A | Discharge status: Home / Self Care | Payer: BC Managed Care – PPO | Source: Ambulatory Visit | Attending: Otolaryngology | Admitting: Otolaryngology

## 2019-08-13 DIAGNOSIS — D497 Neoplasm of unspecified behavior of endocrine glands and other parts of nervous system: Secondary | ICD-10-CM

## 2019-08-13 DIAGNOSIS — Z79899 Other long term (current) drug therapy: Secondary | ICD-10-CM | POA: Diagnosis not present

## 2019-08-13 DIAGNOSIS — D34 Benign neoplasm of thyroid gland: Secondary | ICD-10-CM | POA: Diagnosis present

## 2019-08-13 DIAGNOSIS — I10 Essential (primary) hypertension: Secondary | ICD-10-CM | POA: Diagnosis not present

## 2019-08-13 DIAGNOSIS — E042 Nontoxic multinodular goiter: Secondary | ICD-10-CM | POA: Diagnosis not present

## 2019-08-13 DIAGNOSIS — K219 Gastro-esophageal reflux disease without esophagitis: Secondary | ICD-10-CM | POA: Insufficient documentation

## 2019-08-13 DIAGNOSIS — Z6841 Body Mass Index (BMI) 40.0 and over, adult: Secondary | ICD-10-CM | POA: Insufficient documentation

## 2019-08-13 DIAGNOSIS — E079 Disorder of thyroid, unspecified: Secondary | ICD-10-CM

## 2019-08-13 DIAGNOSIS — E063 Autoimmune thyroiditis: Secondary | ICD-10-CM | POA: Diagnosis not present

## 2019-08-13 DIAGNOSIS — C73 Malignant neoplasm of thyroid gland: Secondary | ICD-10-CM | POA: Diagnosis present

## 2019-08-13 DIAGNOSIS — Z85850 Personal history of malignant neoplasm of thyroid: Secondary | ICD-10-CM | POA: Diagnosis present

## 2019-08-13 HISTORY — PX: THYROIDECTOMY: SHX17

## 2019-08-13 SURGERY — THYROIDECTOMY
Anesthesia: General | Site: Neck | Laterality: Right

## 2019-08-13 MED ORDER — SUGAMMADEX SODIUM 200 MG/2ML IV SOLN
INTRAVENOUS | Status: DC | PRN
  Administered 2019-08-13: 200 mg via INTRAVENOUS

## 2019-08-13 MED ORDER — ONDANSETRON HCL 4 MG/2ML IJ SOLN
INTRAMUSCULAR | Status: AC
  Filled 2019-08-13: qty 2

## 2019-08-13 MED ORDER — DEXAMETHASONE SODIUM PHOSPHATE 10 MG/ML IJ SOLN
INTRAMUSCULAR | Status: DC | PRN
  Administered 2019-08-13: 10 mg via INTRAVENOUS

## 2019-08-13 MED ORDER — BACITRACIN ZINC 500 UNIT/GM EX OINT
1.0000 "application " | TOPICAL_OINTMENT | Freq: Three times a day (TID) | CUTANEOUS | Status: DC
  Administered 2019-08-13: 1 via TOPICAL
  Filled 2019-08-13: qty 28.4

## 2019-08-13 MED ORDER — EYE VITAMINS PO CAPS: ORAL_CAPSULE | Freq: Every day | ORAL | Status: DC

## 2019-08-13 MED ORDER — MORPHINE SULFATE (PF) 2 MG/ML IV SOLN: 2.0000 mg | INTRAVENOUS | Status: DC | PRN

## 2019-08-13 MED ORDER — MELOXICAM 7.5 MG PO TABS
15.0000 mg | ORAL_TABLET | Freq: Every day | ORAL | Status: DC
  Filled 2019-08-13: qty 2

## 2019-08-13 MED ORDER — IBUPROFEN 100 MG/5ML PO SUSP
400.0000 mg | Freq: Four times a day (QID) | ORAL | Status: DC | PRN
  Filled 2019-08-13: qty 20

## 2019-08-13 MED ORDER — FENTANYL CITRATE (PF) 100 MCG/2ML IJ SOLN
INTRAMUSCULAR | Status: DC | PRN
  Administered 2019-08-13 (×2): 50 ug via INTRAVENOUS
  Administered 2019-08-13: 100 ug via INTRAVENOUS
  Administered 2019-08-13 (×3): 50 ug via INTRAVENOUS

## 2019-08-13 MED ORDER — 0.9 % SODIUM CHLORIDE (POUR BTL) OPTIME
TOPICAL | Status: DC | PRN
  Administered 2019-08-13: 1000 mL

## 2019-08-13 MED ORDER — LACTATED RINGERS IV SOLN: INTRAVENOUS | Status: DC | PRN

## 2019-08-13 MED ORDER — FENTANYL CITRATE (PF) 100 MCG/2ML IJ SOLN: 25.0000 ug | INTRAMUSCULAR | Status: DC | PRN

## 2019-08-13 MED ORDER — ACETAMINOPHEN 500 MG PO TABS
1000.0000 mg | ORAL_TABLET | Freq: Once | ORAL | Status: AC
  Administered 2019-08-13: 07:00:00 1000 mg via ORAL
  Filled 2019-08-13: qty 2

## 2019-08-13 MED ORDER — FLUTICASONE PROPIONATE 50 MCG/ACT NA SUSP
1.0000 | Freq: Every evening | NASAL | Status: DC
  Administered 2019-08-13: 1 via NASAL
  Filled 2019-08-13: qty 16

## 2019-08-13 MED ORDER — CHLORHEXIDINE GLUCONATE CLOTH 2 % EX PADS: 6.0000 | MEDICATED_PAD | Freq: Once | CUTANEOUS | Status: DC

## 2019-08-13 MED ORDER — PROPOFOL 10 MG/ML IV BOLUS
INTRAVENOUS | Status: DC | PRN
  Administered 2019-08-13: 150 mg via INTRAVENOUS

## 2019-08-13 MED ORDER — CEFAZOLIN SODIUM-DEXTROSE 2-4 GM/100ML-% IV SOLN
INTRAVENOUS | Status: AC
  Filled 2019-08-13: qty 100

## 2019-08-13 MED ORDER — FENTANYL CITRATE (PF) 250 MCG/5ML IJ SOLN
INTRAMUSCULAR | Status: AC
  Filled 2019-08-13: qty 5

## 2019-08-13 MED ORDER — ONDANSETRON HCL 4 MG PO TABS: 4.0000 mg | ORAL_TABLET | ORAL | Status: DC | PRN

## 2019-08-13 MED ORDER — KCL IN DEXTROSE-NACL 20-5-0.45 MEQ/L-%-% IV SOLN: INTRAVENOUS | Status: DC

## 2019-08-13 MED ORDER — MIDAZOLAM HCL 5 MG/5ML IJ SOLN
INTRAMUSCULAR | Status: DC | PRN
  Administered 2019-08-13: 2 mg via INTRAVENOUS

## 2019-08-13 MED ORDER — LIDOCAINE-EPINEPHRINE 1 %-1:100000 IJ SOLN
INTRAMUSCULAR | Status: DC | PRN
  Administered 2019-08-13: 4 mL

## 2019-08-13 MED ORDER — PANTOPRAZOLE SODIUM 40 MG PO TBEC
40.0000 mg | DELAYED_RELEASE_TABLET | Freq: Every day | ORAL | Status: DC
  Administered 2019-08-13: 40 mg via ORAL
  Filled 2019-08-13: qty 1

## 2019-08-13 MED ORDER — TRIAMTERENE-HCTZ 37.5-25 MG PO TABS
1.0000 | ORAL_TABLET | Freq: Every day | ORAL | Status: DC
  Administered 2019-08-13: 1 via ORAL
  Filled 2019-08-13 (×2): qty 1

## 2019-08-13 MED ORDER — PROPOFOL 10 MG/ML IV BOLUS
INTRAVENOUS | Status: AC
  Filled 2019-08-13: qty 20

## 2019-08-13 MED ORDER — CALCIUM CITRATE-VITAMIN D 315-200 MG-UNIT PO TABS: 2.0000 | ORAL_TABLET | Freq: Every day | ORAL | Status: DC

## 2019-08-13 MED ORDER — CEFAZOLIN SODIUM-DEXTROSE 1-4 GM/50ML-% IV SOLN
1.0000 g | Freq: Three times a day (TID) | INTRAVENOUS | Status: DC
  Administered 2019-08-13 – 2019-08-14 (×3): 1 g via INTRAVENOUS
  Filled 2019-08-13 (×3): qty 50

## 2019-08-13 MED ORDER — HYDROCODONE-ACETAMINOPHEN 5-325 MG PO TABS
1.0000 | ORAL_TABLET | Freq: Four times a day (QID) | ORAL | Status: DC | PRN
  Administered 2019-08-13: 2 via ORAL
  Administered 2019-08-14: 1 via ORAL
  Filled 2019-08-13 (×2): qty 2

## 2019-08-13 MED ORDER — ADULT MULTIVITAMIN W/MINERALS CH: ORAL_TABLET | Freq: Every day | ORAL | Status: DC

## 2019-08-13 MED ORDER — ONDANSETRON HCL 4 MG/2ML IJ SOLN
4.0000 mg | INTRAMUSCULAR | Status: DC | PRN
  Administered 2019-08-13: 4 mg via INTRAVENOUS

## 2019-08-13 MED ORDER — MIDAZOLAM HCL 2 MG/2ML IJ SOLN
INTRAMUSCULAR | Status: AC
  Filled 2019-08-13: qty 2

## 2019-08-13 MED ORDER — ROCURONIUM BROMIDE 10 MG/ML (PF) SYRINGE
PREFILLED_SYRINGE | INTRAVENOUS | Status: AC
  Filled 2019-08-13: qty 10

## 2019-08-13 MED ORDER — LIDOCAINE 2% (20 MG/ML) 5 ML SYRINGE
INTRAMUSCULAR | Status: DC | PRN
  Administered 2019-08-13: 60 mg via INTRAVENOUS

## 2019-08-13 MED ORDER — ONDANSETRON HCL 4 MG/2ML IJ SOLN
INTRAMUSCULAR | Status: DC | PRN
  Administered 2019-08-13: 4 mg via INTRAVENOUS

## 2019-08-13 MED ORDER — HEMOSTATIC AGENTS (NO CHARGE) OPTIME
TOPICAL | Status: DC | PRN
  Administered 2019-08-13: 1 via TOPICAL

## 2019-08-13 MED ORDER — LABETALOL HCL 5 MG/ML IV SOLN
INTRAVENOUS | Status: DC | PRN
  Administered 2019-08-13: 5 mg via INTRAVENOUS

## 2019-08-13 MED ORDER — DEXAMETHASONE SODIUM PHOSPHATE 10 MG/ML IJ SOLN
INTRAMUSCULAR | Status: AC
  Filled 2019-08-13: qty 1

## 2019-08-13 MED ORDER — BACITRACIN ZINC 500 UNIT/GM EX OINT
TOPICAL_OINTMENT | CUTANEOUS | Status: AC
  Filled 2019-08-13: qty 28.35

## 2019-08-13 MED ORDER — ROCURONIUM BROMIDE 10 MG/ML (PF) SYRINGE
PREFILLED_SYRINGE | INTRAVENOUS | Status: DC | PRN
  Administered 2019-08-13: 100 mg via INTRAVENOUS

## 2019-08-13 MED ORDER — ACETAMINOPHEN 500 MG PO TABS: 1000.0000 mg | ORAL_TABLET | Freq: Every day | ORAL | Status: DC | PRN

## 2019-08-13 MED ORDER — CEFAZOLIN SODIUM-DEXTROSE 2-4 GM/100ML-% IV SOLN
2.0000 g | Freq: Once | INTRAVENOUS | Status: AC
  Administered 2019-08-13: 2 g via INTRAVENOUS
  Filled 2019-08-13: qty 100

## 2019-08-13 MED ORDER — LIDOCAINE-EPINEPHRINE 1 %-1:100000 IJ SOLN
INTRAMUSCULAR | Status: AC
  Filled 2019-08-13: qty 1

## 2019-08-13 SURGICAL SUPPLY — 40 items
ATTRACTOMAT 16X20 MAGNETIC DRP (DRAPES) ×2 IMPLANT
BLADE CLIPPER SURG (BLADE) IMPLANT
CANISTER SUCT 3000ML PPV (MISCELLANEOUS) ×2 IMPLANT
CLEANER TIP ELECTROSURG 2X2 (MISCELLANEOUS) ×2 IMPLANT
CORD BIPOLAR FORCEPS 12FT (ELECTRODE) ×2 IMPLANT
COVER SURGICAL LIGHT HANDLE (MISCELLANEOUS) ×2 IMPLANT
COVER WAND RF STERILE (DRAPES) IMPLANT
DRAIN SNY 7 FPER (WOUND CARE) IMPLANT
ELECT COATED BLADE 2.86 ST (ELECTRODE) ×2 IMPLANT
ELECT REM PT RETURN 9FT ADLT (ELECTROSURGICAL) ×2
ELECTRODE REM PT RTRN 9FT ADLT (ELECTROSURGICAL) ×1 IMPLANT
EVACUATOR SILICONE 100CC (DRAIN) IMPLANT
GAUZE 4X4 16PLY RFD (DISPOSABLE) IMPLANT
GAUZE SPONGE 4X4 12PLY STRL (GAUZE/BANDAGES/DRESSINGS) ×2 IMPLANT
GLOVE SS BIOGEL STRL SZ 7.5 (GLOVE) ×1 IMPLANT
GLOVE SUPERSENSE BIOGEL SZ 7.5 (GLOVE) ×1
GOWN STRL REUS W/ TWL LRG LVL3 (GOWN DISPOSABLE) ×3 IMPLANT
GOWN STRL REUS W/ TWL XL LVL3 (GOWN DISPOSABLE) ×1 IMPLANT
GOWN STRL REUS W/TWL LRG LVL3 (GOWN DISPOSABLE) ×6
GOWN STRL REUS W/TWL XL LVL3 (GOWN DISPOSABLE) ×2
HEMOSTAT SURGICEL 2X4 FIBR (HEMOSTASIS) ×2 IMPLANT
KIT BASIN OR (CUSTOM PROCEDURE TRAY) ×2 IMPLANT
KIT TURNOVER KIT B (KITS) ×2 IMPLANT
LOCATOR NERVE 3 VOLT (DISPOSABLE) IMPLANT
MARKER SKIN DUAL TIP RULER LAB (MISCELLANEOUS) ×2 IMPLANT
NEEDLE HYPO 25GX1X1/2 BEV (NEEDLE) ×2 IMPLANT
NS IRRIG 1000ML POUR BTL (IV SOLUTION) ×2 IMPLANT
PAD ARMBOARD 7.5X6 YLW CONV (MISCELLANEOUS) ×2 IMPLANT
PENCIL BUTTON HOLSTER BLD 10FT (ELECTRODE) ×2 IMPLANT
SHEARS HARMONIC 9CM CVD (BLADE) ×2 IMPLANT
SPECIMEN JAR MEDIUM (MISCELLANEOUS) ×2 IMPLANT
SPONGE INTESTINAL PEANUT (DISPOSABLE) ×2 IMPLANT
SUT CHROMIC 3 0 PS 2 (SUTURE) ×4 IMPLANT
SUT ETHILON 2 0 FS 18 (SUTURE) IMPLANT
SUT ETHILON 6 0 P 1 (SUTURE) ×6 IMPLANT
SUT SILK 2 0 (SUTURE) ×2
SUT SILK 2-0 18XBRD TIE 12 (SUTURE) ×1 IMPLANT
SUT SILK 3 0 (SUTURE) ×2
SUT SILK 3-0 18XBRD TIE 12 (SUTURE) ×1 IMPLANT
TRAY ENT MC OR (CUSTOM PROCEDURE TRAY) ×2 IMPLANT

## 2019-08-13 NOTE — Interval H&P Note (Signed)
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  The various methods of treatment have been discussed with the patient and family. After consideration of risks, benefits and other options for treatment, the patient has consented to  Procedure(s): RIGHT THYROID LOBECTOMY (Right) as a surgical intervention.  The patient's history has been reviewed, patient examined, no change in status, stable for surgery.  I have reviewed the patient's chart and labs.  Questions were answered to the patient's satisfaction.     Melony Overly

## 2019-08-13 NOTE — Anesthesia Procedure Notes (Signed)
Procedure Name: Intubation Date/Time: 08/13/2019 7:43 AM Performed by: Kyung Rudd, CRNA Pre-anesthesia Checklist: Patient identified, Emergency Drugs available, Suction available and Patient being monitored Patient Re-evaluated:Patient Re-evaluated prior to induction Oxygen Delivery Method: Circle system utilized Preoxygenation: Pre-oxygenation with 100% oxygen Induction Type: IV induction Ventilation: Mask ventilation without difficulty Laryngoscope Size: Mac and 3 Grade View: Grade I Tube type: Oral Tube size: 7.0 mm Number of attempts: 1 Airway Equipment and Method: Stylet Placement Confirmation: ETT inserted through vocal cords under direct vision,  positive ETCO2 and breath sounds checked- equal and bilateral Secured at: 21 cm Tube secured with: Tape Dental Injury: Teeth and Oropharynx as per pre-operative assessment

## 2019-08-13 NOTE — Anesthesia Preprocedure Evaluation (Addendum)
Anesthesia Evaluation  Patient identified by MRN, date of birth, ID band Patient awake    Reviewed: Allergy & Precautions, NPO status , Patient's Chart, lab work & pertinent test results  Airway Mallampati: III  TM Distance: >3 FB Neck ROM: Full    Dental no notable dental hx. (+) Chipped, Dental Advisory Given,    Pulmonary neg pulmonary ROS,    Pulmonary exam normal breath sounds clear to auscultation       Cardiovascular hypertension, Pt. on medications negative cardio ROS Normal cardiovascular exam Rhythm:Regular Rate:Normal     Neuro/Psych negative neurological ROS  negative psych ROS   GI/Hepatic Neg liver ROS, GERD  Medicated,  Endo/Other  Morbid obesity (BMI 42)  Renal/GU negative Renal ROS  negative genitourinary   Musculoskeletal  (+) Arthritis ,   Abdominal   Peds  Hematology negative hematology ROS (+)   Anesthesia Other Findings Thyroid CA  Reproductive/Obstetrics                            Anesthesia Physical Anesthesia Plan  ASA: III  Anesthesia Plan: General   Post-op Pain Management:    Induction: Intravenous  PONV Risk Score and Plan: 3 and Midazolam, Dexamethasone and Ondansetron  Airway Management Planned: Oral ETT  Additional Equipment:   Intra-op Plan:   Post-operative Plan: Extubation in OR  Informed Consent: I have reviewed the patients History and Physical, chart, labs and discussed the procedure including the risks, benefits and alternatives for the proposed anesthesia with the patient or authorized representative who has indicated his/her understanding and acceptance.     Dental advisory given  Plan Discussed with: CRNA  Anesthesia Plan Comments:         Anesthesia Quick Evaluation

## 2019-08-13 NOTE — Transfer of Care (Signed)
Immediate Anesthesia Transfer of Care Note  Patient: Toni Francis  Procedure(s) Performed: RIGHT THYROID LOBECTOMY (Right Neck)  Patient Location: PACU  Anesthesia Type:General  Level of Consciousness: drowsy  Airway & Oxygen Therapy: Patient Spontanous Breathing and Patient connected to nasal cannula oxygen  Post-op Assessment: Report given to RN and Post -op Vital signs reviewed and stable  Post vital signs: Reviewed and stable  Last Vitals:  Vitals Value Taken Time  BP 173/77 08/13/19 1048  Temp    Pulse 80 08/13/19 1050  Resp 14 08/13/19 1050  SpO2 95 % 08/13/19 1050  Vitals shown include unvalidated device data.  Last Pain:  Vitals:   08/13/19 1048  PainSc: (P) Asleep         Complications: No apparent anesthesia complications

## 2019-08-13 NOTE — Op Note (Signed)
NAMELEVERNE, MALZONE MEDICAL RECORD [id number] ACCOUNT [id number] DATE OF BIRTH:16-Jul-1955 FACILITY: [facility] LOCATION: [place], [place]  OPERATIVE REPORT  DATE OF PROCEDURE:  08/13/2019  PREOPERATIVE DIAGNOSIS:  Right thyroid lobe neoplasm.  POSTOPERATIVE DIAGNOSIS:  Right thyroid lobe neoplasm.  OPERATION PERFORMED:  Right thyroid lobectomy.  SURGEON:  Melony Overly, MD  ANESTHESIA:  General endotracheal.  COMPLICATIONS:  None.  ESTIMATED BLOOD LOSS:  200 mL.  BRIEF CLINICAL NOTE:  The patent is a 64 year old female who has had a nodule in the right side of her neck, right midline neck for several months now.  She underwent an ultrasound which showed a large right thyroid lobe nodule, 1 inferiorly and 1  superiorly.  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  The patient is taken to the operating room at this time for right thyroid lobectomy.  DESCRIPTION OF PROCEDURE:  After adequate endotracheal anesthesia, the neck incision was marked with a horizontal incision just above the manubrium in the neck just below the cricoid cartilage and just overlying the palpable thyroid mass.  The area was  prepped with Betadine solution and draped out with sterile towels.  The area was injected with 5 mL of Xylocaine with epinephrine for hemostasis.  A horizontal incision was made.  Dissection was carried down through the subcutaneous tissue with cautery.   Two veins that were cut were ligated with 3-0 silk sutures.  Dissection was carried down through the subcutaneous tissue down to the strap muscles.  Flaps were elevated superiorly and inferiorly.  The strap muscles were then divided in midline and  retracted laterally.  On elevating the strap muscles off of the thyroid mass, this was very large and crossed the midline and involved the isthmus.  It extended more so to the right,  but did cross the trachea and the isthmus area.  Dissection was carried  down inferiorly on the right side.  The thyroid artery was identified and ligated with 2-0  silk suture.  The lateral thyroid veins, which were numerous and large, were either bipolared or clamped and ligated and tied with 3-0 silk sutures.  The  superior thyroid artery and veins were identified, ligated with 2-0 silk sutures.  It was a little bit difficult to determine exactly where the right thyroid nodule extended into the left thyroid.  After exposing the trachea, the thyroid isthmus was  divided just on the left side of the trachea.  The patient had moderate bleeding because of the thickness of the thyroid isthmus.  This was controlled with cautery.  The thyroid was dissected off of the trachea.  Dissection was carried down right next to  the thyroid on the right side.  One superior specimen of thyroid nodular tissue was sent as frozen section for concern that it might parathyroid versus a portion of the tumor.  This came back just thyroid tissue.  The thyroid lobe was removed on the  right side.  This was sent as a right thyroid specimen to pathology.  Hemostasis was obtained with bipolar cautery.  She had moderate bleeding throughout the case because of the size and thickness of the thyroid isthmus that had to be divided.  Following  completion of the case, Surgicel Fibrillar was placed in the right thyroid bed.  There was minimal bleeding at this point.  It was elected to use a quarter-inch Penrose drain to drain the thyroid bed.  The  wound was then closed with a 3-0 chromic suture  subcutaneously, 6-0 nylon to reapproximate the skin edges and a Penrose drain was brought out through the incision site midline.  This completed the procedure.  The patient was awoken from anesthesia and transferred to recovery room, postoperatively  doing well.  DISPOSITION:  The patient will be observed overnight in the hospital.  Plan on  removing the Penrose drain tomorrow morning and send her home and have her follow up in my office in 1 week to have sutures removed.  VN/NUANCE  D:08/13/2019 T:08/13/2019 JOB:[id number]

## 2019-08-13 NOTE — Anesthesia Postprocedure Evaluation (Signed)
Anesthesia Post Note  Patient: Toni Francis  Procedure(s) Performed: RIGHT THYROID LOBECTOMY (Right Neck)     Patient location during evaluation: PACU Anesthesia Type: General Level of consciousness: awake and alert Pain management: pain level controlled Vital Signs Assessment: post-procedure vital signs reviewed and stable Respiratory status: spontaneous breathing, nonlabored ventilation, respiratory function stable and patient connected to nasal cannula oxygen Cardiovascular status: blood pressure returned to baseline and stable Postop Assessment: no apparent nausea or vomiting Anesthetic complications: no    Last Vitals:  Vitals:   08/13/19 1150 08/13/19 1200  BP:  (!) 159/93  Pulse:  78  Resp:  19  Temp:    SpO2: (!) 88% 94%    Last Pain:  Vitals:   08/13/19 1145  PainSc: 0-No pain                 Florrie Ramires L Jlee Harkless

## 2019-08-13 NOTE — Brief Op Note (Signed)
08/13/2019  10:34 AM  PATIENT:  Toni Francis  64 y.o. female  PRE-OPERATIVE DIAGNOSIS:  NEOPLASMA OF UNCERTAIN BEHAVIOR OF THYROID GLAND RIGHT  POST-OPERATIVE DIAGNOSIS:  NEOPLASMA OF UNCERTAIN BEHAVIOR OF THYROID GLAND RIGHT  PROCEDURE:  Procedure(s): RIGHT THYROID LOBECTOMY (Right)  SURGEON:  Surgeon(s) and Role:    Rozetta Nunnery, MD - Primary  PHYSICIAN ASSISTANT:   ASSISTANTS: none   ANESTHESIA:   general  EBL:  200 mL   BLOOD ADMINISTERED:none  DRAINS: Penrose drain in the neck thyroid incision   LOCAL MEDICATIONS USED:  XYLOCAINE 5 cc  SPECIMEN:  Source of Specimen:  right thyroid lobe  DISPOSITION OF SPECIMEN:  PATHOLOGY  COUNTS:  YES  TOURNIQUET:  * No tourniquets in log *  DICTATION: .Other Dictation: Dictation Number [id number]  PLAN OF CARE: Admit for overnight observation  PATIENT DISPOSITION:  PACU - hemodynamically stable.   Delay start of Pharmacological VTE agent (>24hrs) due to surgical blood loss or risk of bleeding: yes

## 2019-08-14 DIAGNOSIS — C73 Malignant neoplasm of thyroid gland: Secondary | ICD-10-CM | POA: Diagnosis not present

## 2019-08-14 MED ORDER — BACITRACIN ZINC 500 UNIT/GM EX OINT: 1.0000 "application " | TOPICAL_OINTMENT | Freq: Three times a day (TID) | CUTANEOUS | 0 refills | Status: DC

## 2019-08-14 NOTE — Discharge Instructions (Signed)
Keep incision site dry for the next 24 hours.  May then get it wet. Apply antibiotic ointment bacitracin to incision site daily. Tylenol as needed pain or meloxicam. Call Dr. Lucia Gaskins if you have any questions or problems     [phone] Return to Dr. Pollie Friar office to have your sutures removed on Monday at 12:45

## 2019-08-14 NOTE — Discharge Summary (Signed)
POD 1 AF VSS Patient with moderate drainage from the Penrose drain with 4 x 4 dressing soaked. Dressing was removed and the Penrose drain was removed with no significant drainage. Steri-Strip was placed over the Penrose drain site. Bacitracin ointment was applied to incision site and wound dressed. Patient having no respiratory problems no neck swelling and voice is normal. Incision site is healing nicely. Patient is discharged home later this morning. She will continue with her normal medications. She will follow-up in my office in 6 days for recheck and removal of sutures.

## 2019-08-14 NOTE — Plan of Care (Signed)
Pt given D/C instructions with verbal understanding. Rx was sent to pharmacy by MD. Pt's incision is clean and dry with no sign of infection. Pt's IV was removed prior to D/C. Pt D/C'd home via wheelchair per MD order. Pt is stable @ D/C and has no other needs at this time. Holli Humbles, RN

## 2019-08-15 LAB — SURGICAL PATHOLOGY

## 2019-08-20 ENCOUNTER — Ambulatory Visit (INDEPENDENT_AMBULATORY_CARE_PROVIDER_SITE_OTHER): Payer: BC Managed Care – PPO | Admitting: Otolaryngology

## 2019-08-20 ENCOUNTER — Other Ambulatory Visit: Payer: Self-pay

## 2019-08-20 VITALS — Temp 97.5°F

## 2019-08-20 DIAGNOSIS — Z4889 Encounter for other specified surgical aftercare: Secondary | ICD-10-CM

## 2019-08-20 DIAGNOSIS — E040 Nontoxic diffuse goiter: Secondary | ICD-10-CM

## 2019-08-20 DIAGNOSIS — C73 Malignant neoplasm of thyroid gland: Secondary | ICD-10-CM

## 2019-08-20 NOTE — Progress Notes (Signed)
HPI: Toni Francis is a 64 y.o. female who presents 7 days s/p right thyroid lobectomy..  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  2 lymph nodes were identified and were negative.  Patient is doing well with no specific complaints.  Past Medical History:  Diagnosis Date  . Abdominal hernia   . Arthritis   . GERD (gastroesophageal reflux disease)   . Hypertension   . Pinched nerve    L4-5,back   Past Surgical History:  Procedure Laterality Date  . CHOLECYSTECTOMY    . THYROIDECTOMY Right 08/13/2019   Procedure: RIGHT THYROID LOBECTOMY;  Surgeon: Rozetta Nunnery, MD;  Location: [place];  Service: ENT;  Laterality: Right;   Social History   Socioeconomic History  . Marital status: Divorced    Spouse name: Not on file  . Number of children: 2  . Years of education: 32  . Highest education level: Some college, no degree  Occupational History    Employer: [employer]  Tobacco Use  . Smoking status: Never Smoker  . Smokeless tobacco: Never Used  Substance and Sexual Activity  . Alcohol use: No  . Drug use: No  . Sexual activity: Not on file  Other Topics Concern  . Not on file  Social History Narrative  . Not on file   Social Determinants of Health   Financial Resource Strain:   . Difficulty of Paying Living Expenses:   Food Insecurity:   . Worried About Charity fundraiser in the Last Year:   . Arboriculturist in the Last Year:   Transportation Needs:   . Film/video editor (Medical):   Marland Kitchen Lack of Transportation (Non-Medical):   Physical Activity:   . Days of Exercise per Week:   . Minutes of Exercise per Session:   Stress:   . Feeling of Stress :   Social Connections:   . Frequency of Communication with Friends and Family:   . Frequency of Social Gatherings with Friends and Family:   . Attends Religious Services:   . Active  Member of Clubs or Organizations:   . Attends Archivist Meetings:   Marland Kitchen Marital Status:    Family History  Problem Relation Age of Onset  . Colon cancer Maternal Uncle 50   Allergies  Allergen Reactions  . Ampicillin Rash    Did it involve swelling of the face/tongue/throat, SOB, or low BP? No Did it involve sudden or severe rash/hives, skin peeling, or any reaction on the inside of your mouth or nose? Yes Did you need to seek medical attention at a hospital or doctor's office? Yes When did it last happen?10 + years If all above answers are "NO", may proceed with cephalosporin use.   . Codeine Nausea And Vomiting and Other (See Comments)    Also causes headaches   . Pseudoephedrine Palpitations   Prior to Admission medications   Medication Sig Start Date End Date Taking? Authorizing Provider  acetaminophen (TYLENOL) 500 MG tablet Take 1,000 mg by mouth daily as needed for moderate pain or headache.    [provider]  bacitracin ointment Apply 1 application topically every 8 (eight) hours. 08/14/19   Rozetta Nunnery, MD  Calcium Citrate-Vitamin D (CALCIUM + D PO) Take 1 tablet by mouth daily.    [provider]  DULoxetine (CYMBALTA) 30 MG capsule Take 1 capsule (30  mg total) by mouth daily. Patient not taking: Reported on 07/30/2019 01/18/19   Claretta Fraise, MD  fluticasone [facility]) 50 MCG/ACT nasal spray USE TWO SPRAYS IN EACH NOSTRIL ONCE to twice  DAILY Patient taking differently: Place 1 spray into both nostrils every evening.  07/08/18   Chipper Herb, MD  levocetirizine (XYZAL) 5 MG tablet Take 1 tablet (5 mg total) by mouth every evening. For allergy 05/25/19   Claretta Fraise, MD  meloxicam (MOBIC) 15 MG tablet Take 1 tablet (15 mg total) by mouth daily. Patient taking differently: Take 15 mg by mouth every evening.  05/25/19   Claretta Fraise, MD  moxifloxacin (AVELOX) 400 MG tablet Take 1 tablet (400 mg total) by mouth daily. Take all of  these, for infection Patient not taking: Reported on 07/30/2019 05/25/19   Claretta Fraise, MD  Multiple Vitamins-Minerals (CENTRUM SILVER PO) Take 1 tablet by mouth daily.    [provider]  Multiple Vitamins-Minerals (EYE VITAMINS PO) Take 1 capsule by mouth daily.    [provider]  Omega-3 Fatty Acids (FISH OIL PO) Take 1 capsule by mouth daily.     [provider]  omeprazole (PRILOSEC) 20 MG capsule Take 20 mg by mouth every evening.    [provider]  predniSONE (DELTASONE) 10 MG tablet Take 5 daily for 3 days followed by 4,3,2 and 1 for 3 days each. Patient not taking: Reported on 06/11/2019 05/25/19   Claretta Fraise, MD  triamterene-hydrochlorothiazide (MAXZIDE-25) 37.5-25 MG tablet Take 1 tablet by mouth daily. For blood pressure and fluid 05/25/19   Claretta Fraise, MD     Physical Exam: Incision site is healing nicely with no signs of infection very minimal erythema and slight swelling superiorly.  Sutures were removed in the office today. Of note patient has had history of sinus issues and uses nasal steroid spray.  On nasal exam in the office today she has a moderate septal deviation to the right with no signs of infection.   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  Plan: No further therapy is required for the papillary thyroid carcinoma as this was small and totally excised. She will follow-up in 3 to 4 months for recheck.   Radene Journey, MD

## 2019-09-04 NOTE — Discharge Summary (Signed)
NAMECHLOI, BLEILE MEDICAL RECORD [id number] ACCOUNT [id number] DATE OF BIRTH:[birth year] FACILITY: [facility] LOCATION: [facility] PHYSICIAN:Yarelli Decelles Lincoln Maxin, MD  DISCHARGE SUMMARY  DATE OF ADMISSION:  08/13/2019  DATE OF DISCHARGE:  08/14/2019   DIAGNOSIS:  Right thyroid neoplasm.  POSTOPERATIVE DIAGNOSIS:  Right thyroid neoplasm.  OTHER DIAGNOSES:  Allergic rhinitis.  OPERATIONS DURING THIS HOSPITALIZATION:  Right thyroid lobectomy.  HOSPITAL COURSE:  The patient was admitted via the operating room on 08/13/2019, at which time she underwent a right thyroid lobectomy because of previous enlarging right thyroid nodule with needle aspiration suspicious for a possible malignancy.  The  patient underwent a right thyroid lobectomy on 08/13/2019 and had moderate amount of bleeding and a Penrose drain was placed   following the procedure.  The patient was admitted for overnight observation.  The patient had no difficulties and was  subsequently discharged on 08/14/2019, at which time the Penrose drain was removed and a small Steri-Strip was placed over the drain site.  She had no significant swelling and had no difficulty with her airway or difficulty swallowing.  The patient was  discharged home and will follow up in my office in 6 days for recheck and removal of sutures.  FINAL DIAGNOSIS:   Right thyroid neoplasm, uncertain behavior.    CONDITION AT DISCHARGE:  At the time of discharge, the patient was doing well.  VN/NUANCE D:09/04/2019 T:09/04/2019 JOB:[id number]

## 2019-09-19 ENCOUNTER — Other Ambulatory Visit: Payer: Self-pay | Admitting: Family Medicine

## 2019-09-19 DIAGNOSIS — J0100 Acute maxillary sinusitis, unspecified: Secondary | ICD-10-CM

## 2019-10-18 ENCOUNTER — Other Ambulatory Visit: Payer: Self-pay | Admitting: Family Medicine

## 2019-10-18 DIAGNOSIS — J0111 Acute recurrent frontal sinusitis: Secondary | ICD-10-CM

## 2019-10-18 DIAGNOSIS — M1990 Unspecified osteoarthritis, unspecified site: Secondary | ICD-10-CM

## 2019-12-12 IMAGING — DX DG FOOT COMPLETE 3+V*R*
3 series · 3 of 3 positions shown · non-contrast
Comparison: None.

CLINICAL DATA: Chronic right foot pain.  No known injury.

EXAM:
RIGHT FOOT COMPLETE - 3+ VIEW

[foot ap]
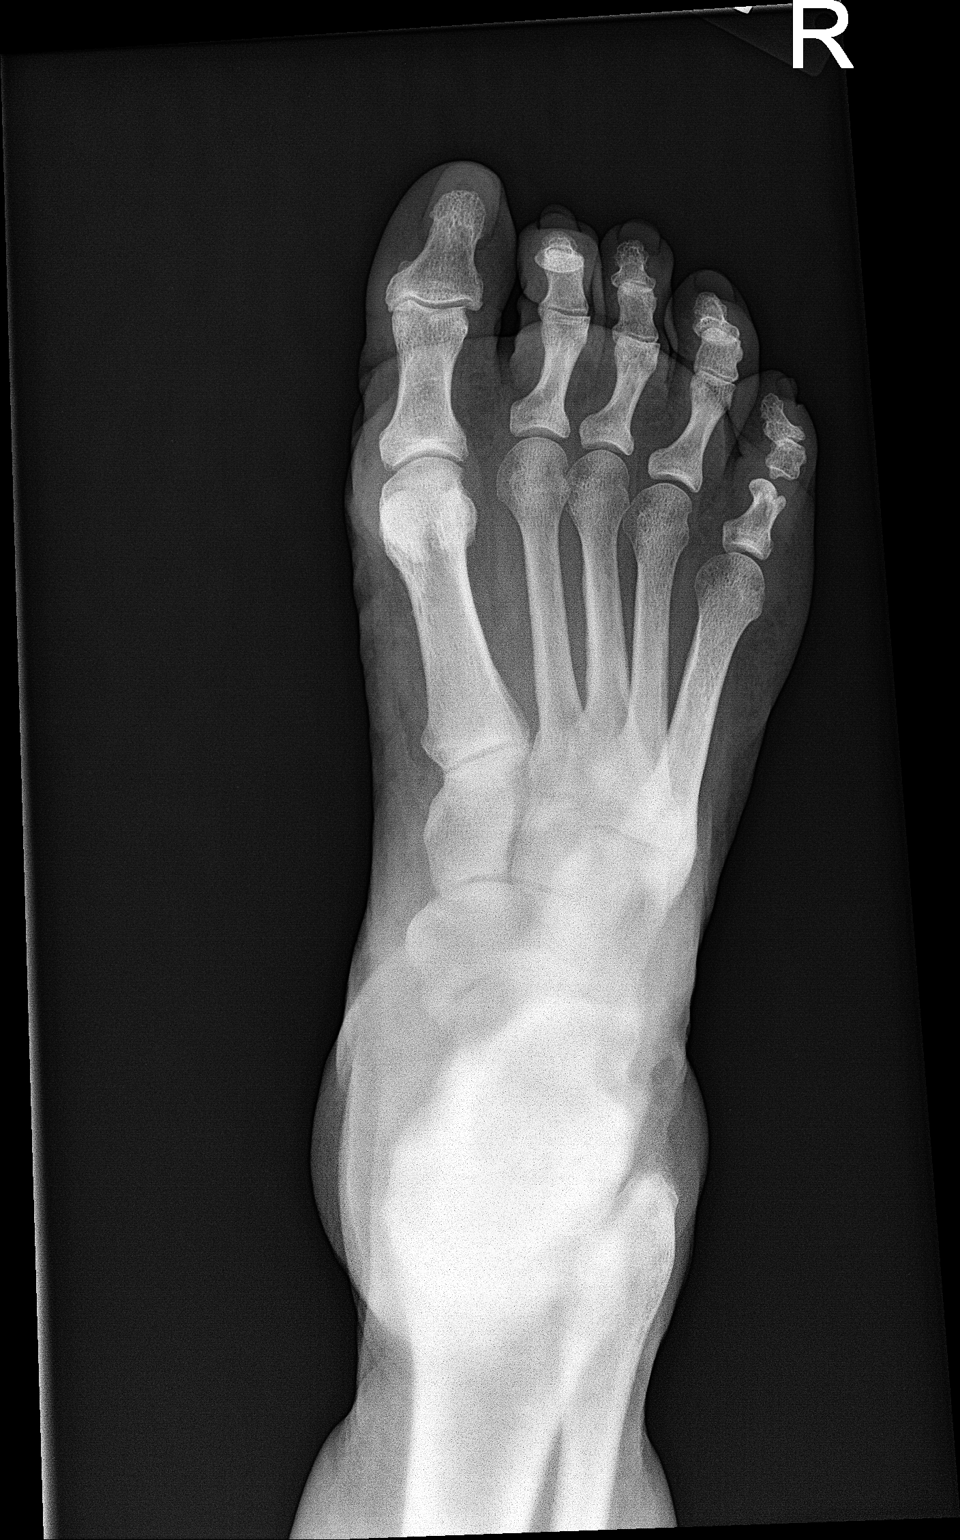

[foot obl]
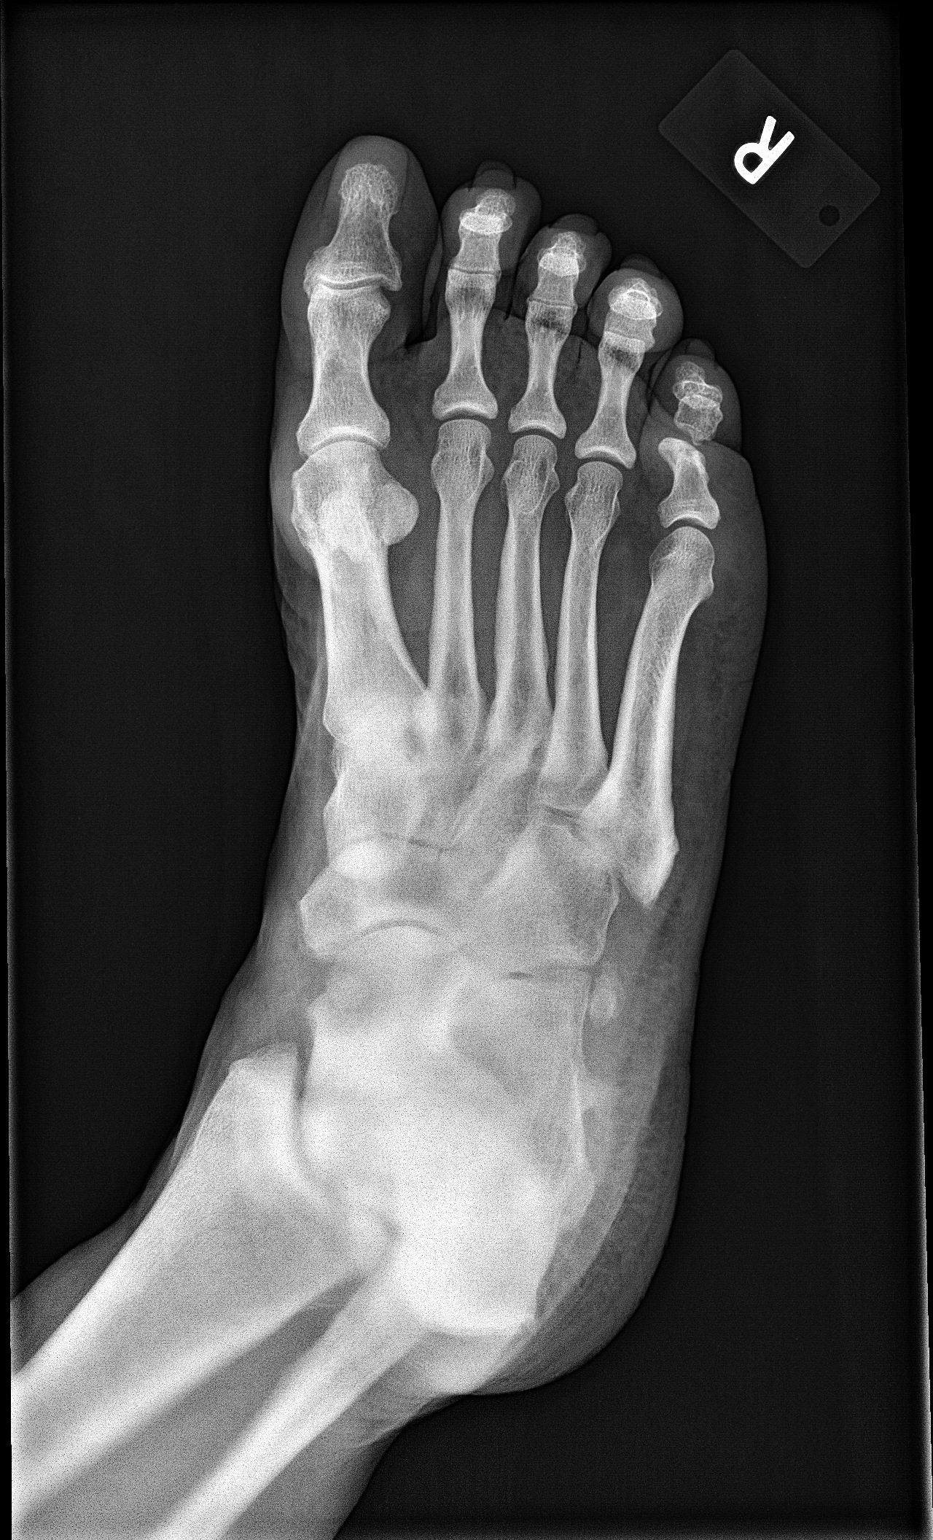

[foot lat]
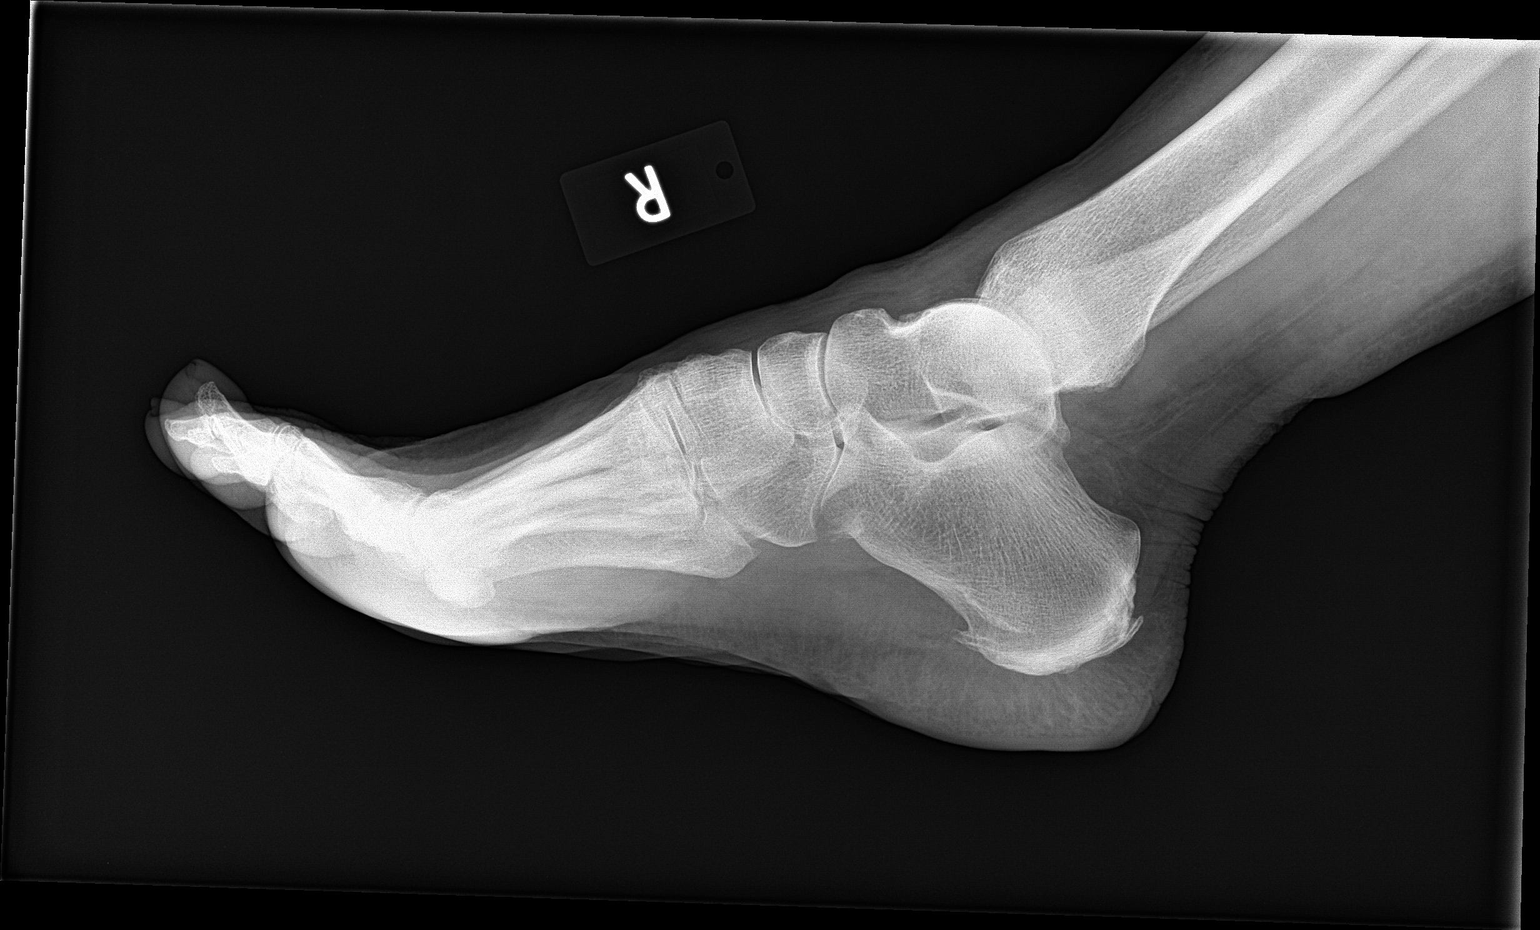

[3 of 3 positions shown; findings below may reference images not displayed]

FINDINGS: There is no evidence of fracture or dislocation. There is no
evidence of arthropathy or other focal bone abnormality. Defect in
the head of the proximal phalanx of the little toe has an appearance
most compatible with resection arthroplasty or possible prior
trauma. Soft tissues are unremarkable.
IMPRESSION: No acute abnormality or finding to explain the patient's pain.

## 2019-12-18 ENCOUNTER — Ambulatory Visit (INDEPENDENT_AMBULATORY_CARE_PROVIDER_SITE_OTHER): Payer: BC Managed Care – PPO | Admitting: Otolaryngology

## 2019-12-22 ENCOUNTER — Other Ambulatory Visit: Payer: Self-pay | Admitting: Family Medicine

## 2019-12-22 DIAGNOSIS — J0100 Acute maxillary sinusitis, unspecified: Secondary | ICD-10-CM

## 2020-01-11 ENCOUNTER — Telehealth: Payer: Self-pay | Admitting: Family Medicine

## 2020-01-11 NOTE — Telephone Encounter (Signed)
What is she taking pain meds for

## 2020-01-11 NOTE — Telephone Encounter (Signed)
I'm okay with that

## 2020-01-14 ENCOUNTER — Encounter (INDEPENDENT_AMBULATORY_CARE_PROVIDER_SITE_OTHER): Payer: Self-pay | Admitting: Otolaryngology

## 2020-01-14 ENCOUNTER — Other Ambulatory Visit: Payer: Self-pay

## 2020-01-14 ENCOUNTER — Ambulatory Visit (INDEPENDENT_AMBULATORY_CARE_PROVIDER_SITE_OTHER): Payer: BC Managed Care – PPO | Admitting: Otolaryngology

## 2020-01-14 VITALS — Temp 97.0°F

## 2020-01-14 DIAGNOSIS — Z8639 Personal history of other endocrine, nutritional and metabolic disease: Secondary | ICD-10-CM | POA: Diagnosis not present

## 2020-01-14 NOTE — Telephone Encounter (Signed)
Pt is not taking pain. Aware MMM will take her

## 2020-01-14 NOTE — Progress Notes (Signed)
HPI: Toni Francis is a 64 y.o. 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  Her symptoms of throat clearing and swallowing are doing much better..  Past Medical History:  Diagnosis Date  . Abdominal hernia   . Arthritis   . GERD (gastroesophageal reflux disease)   . Hypertension   . Pinched nerve    L4-5,back   Past Surgical History:  Procedure Laterality Date  . CHOLECYSTECTOMY    . THYROIDECTOMY Right 08/13/2019   Procedure: RIGHT THYROID LOBECTOMY;  Surgeon: Rozetta Nunnery, MD;  Location: [facility];  Service: ENT;  Laterality: Right;   Social History   Socioeconomic History  . Marital status: Divorced    Spouse name: Not on file  . Number of children: 2  . Years of education: 4  . Highest education level: Some college, no degree  Occupational History    Employer: [employer]  Tobacco Use  . Smoking status: Never Smoker  . Smokeless tobacco: Never Used  Vaping Use  . Vaping Use: Never used  Substance and Sexual Activity  . Alcohol use: No  . Drug use: No  . Sexual activity: Not on file  Other Topics Concern  . Not on file  Social History Narrative  . Not on file   Social Determinants of Health   Financial Resource Strain:   . Difficulty of Paying Living Expenses: Not on file  Food Insecurity:   . Worried About Charity fundraiser in the Last Year: Not on file  . Ran Out of Food in the Last Year: Not on file  Transportation Needs:   . Lack of Transportation (Medical): Not on file  . Lack of Transportation (Non-Medical): Not on file  Physical Activity:   . Days of Exercise per Week: Not on file  . Minutes of Exercise per Session: Not on file  Stress:   . Feeling of Stress : Not on file  Social Connections:   . Frequency of Communication with Friends and Family: Not on file  . Frequency of Social  Gatherings with Friends and Family: Not on file  . Attends Religious Services: Not on file  . Active Member of Clubs or Organizations: Not on file  . Attends Archivist Meetings: Not on file  . Marital Status: Not on file   Family History  Problem Relation Age of Onset  . Colon cancer Maternal Uncle 50   Allergies  Allergen Reactions  . Ampicillin Rash    Did it involve swelling of the face/tongue/throat, SOB, or low BP? No Did it involve sudden or severe rash/hives, skin peeling, or any reaction on the inside of your mouth or nose? Yes Did you need to seek medical attention at a hospital or doctor's office? Yes When did it last happen?10 + years If all above answers are "NO", may proceed with cephalosporin use.   . Codeine Nausea And Vomiting and Other (See Comments)    Also causes headaches   . Pseudoephedrine Palpitations   Prior to Admission medications   Medication Sig Start Date End Date Taking? Authorizing Provider  acetaminophen (TYLENOL) 500 MG tablet Take 1,000 mg by mouth daily as needed for moderate pain or headache.   Yes [provider]  bacitracin ointment Apply 1 application topically every 8 (eight) hours. 08/14/19  Yes Rozetta Nunnery, MD  Calcium Citrate-Vitamin  D (CALCIUM + D PO) Take 1 tablet by mouth daily.   Yes [provider]  DULoxetine (CYMBALTA) 30 MG capsule Take 1 capsule (30 mg total) by mouth daily. 01/18/19  Yes Stacks, Cletus Gash, MD  fluticasone (FLONASE) 50 MCG/ACT nasal spray USE 2 SPRAY(S) IN EACH NOSTRIL TWICE DAILY 12/24/19  Yes Stacks, Cletus Gash, MD  levocetirizine (XYZAL) 5 MG tablet TAKE 1 TABLET BY MOUTH IN THE EVENING FOR ALLERGIES 10/18/19  Yes Claretta Fraise, MD  meloxicam (MOBIC) 15 MG tablet Take 1 tablet by mouth once daily 10/18/19  Yes Stacks, Cletus Gash, MD  moxifloxacin (AVELOX) 400 MG tablet Take 1 tablet (400 mg total) by mouth daily. Take all of these, for infection 05/25/19  Yes Stacks, Cletus Gash, MD   Multiple Vitamins-Minerals (CENTRUM SILVER PO) Take 1 tablet by mouth daily.   Yes [provider]  Multiple Vitamins-Minerals (EYE VITAMINS PO) Take 1 capsule by mouth daily.   Yes [provider]  Omega-3 Fatty Acids (FISH OIL PO) Take 1 capsule by mouth daily.    Yes [provider]  omeprazole (PRILOSEC) 20 MG capsule Take 20 mg by mouth every evening.   Yes [provider]  predniSONE (DELTASONE) 10 MG tablet Take 5 daily for 3 days followed by 4,3,2 and 1 for 3 days each. 05/25/19  Yes Claretta Fraise, MD  triamterene-hydrochlorothiazide (MAXZIDE-25) 37.5-25 MG tablet Take 1 tablet by mouth daily. For blood pressure and fluid 05/25/19  Yes Stacks, Cletus Gash, MD     Positive ROS: Otherwise negative  All other systems have been reviewed and were otherwise negative with the exception of those mentioned in the HPI and as above.  Physical Exam: Constitutional: Alert, well-appearing, no acute distress Ears: External ears without lesions or tenderness. Ear canals are clear bilaterally with intact, clear TMs.  Nasal: External nose without lesions. Clear nasal passages Oral: Lips and gums without lesions. Tongue and palate mucosa without lesions. Posterior oropharynx clear. Neck: No palpable adenopathy or masses.  There is no palpable nodules within the thyroid area on either side.  There is no supraclavicular adenopathy noted on either side.  No palpable adenopathy along the jugular chain of lymph nodes. Respiratory: Breathing comfortably  Skin: No facial/neck lesions or rash noted.  Procedures  Assessment: Status post right hemithyroidectomy for goiter and small papillary thyroid carcinoma.  Plan: She is doing well with no symptoms. She has not had her thyroid level checked.  She is scheduled to see her primary care doctor next month and would recommend obtaining TSH and make sure she is not hypothyroid although she is having no symptoms. Of note she has  had weight gain but has been on steroids.   Radene Journey, MD

## 2020-01-21 ENCOUNTER — Ambulatory Visit (INDEPENDENT_AMBULATORY_CARE_PROVIDER_SITE_OTHER): Payer: BC Managed Care – PPO | Admitting: Nurse Practitioner

## 2020-01-21 ENCOUNTER — Encounter: Payer: Self-pay | Admitting: Nurse Practitioner

## 2020-01-21 DIAGNOSIS — U071 COVID-19: Secondary | ICD-10-CM

## 2020-01-21 NOTE — Progress Notes (Signed)
° °  Virtual Visit via telephone Note Due to COVID-19 pandemic this visit was conducted virtually. This visit type was conducted due to national recommendations for restrictions regarding the COVID-19 Pandemic (e.g. social distancing, sheltering in place) in an effort to limit this patient's exposure and mitigate transmission in our community. All issues noted in this document were discussed and addressed.  A physical exam was not performed with this format.  I connected with Toni Francis on 01/21/20 at 12:00 by telephone and verified that I am speaking with the correct person using two identifiers. Toni Francis is currently located in hotel and no ne is currently with her during visit. The provider, Mary-Margaret Hassell Done, FNP is located in their office at time of visit.  I discussed the limitations, risks, security and privacy concerns of performing an evaluation and management service by telephone and the availability of in person appointments. I also discussed with the patient that there may be a patient responsible charge related to this service. The patient expressed understanding and agreed to proceed.   History and Present Illness:   Chief Complaint: Covid Positive   HPI Patient tested positive for covid on Friday. Slight cough and congestion, decreased appetite and low grade fever of 100 this morning. She has been taking tylenol, vitamin c , vitamin d and zinc daily and she is better.     Review of Systems  Constitutional: Negative for chills and fever.  HENT: Positive for congestion. Negative for sore throat.   Respiratory: Positive for cough.   Musculoskeletal: Negative.   Neurological: Negative for dizziness and headaches.  Psychiatric/Behavioral: Negative.   All other systems reviewed and are negative.    Observations/Objective: Alert and oriented- answers all questions appropriately No distress    Assessment and Plan: Toni Francis in today with chief complaint of  Covid Positive   1. Lab test positive for detection of COVID-19 virus Force fluids Rest Tylenol OTC for fever Continue decongestant If symptoms worsen let us know.    Follow Up Instructions: prn    I discussed the assessment and treatment plan with the patient. The patient was provided an opportunity to ask questions and all were answered. The patient agreed with the plan and demonstrated an understanding of the instructions.   The patient was advised to call back or seek an in-person evaluation if the symptoms worsen or if the condition fails to improve as anticipated.  The above assessment and management plan was discussed with the patient. The patient verbalized understanding of and has agreed to the management plan. Patient is aware to call the clinic if symptoms persist or worsen. Patient is aware when to return to the clinic for a follow-up visit. Patient educated on when it is appropriate to go to the emergency department.   Time call ended:  12:15 I provided 15 minutes of non-face-to-face time during this encounter.    Mary-Margaret Hassell Done, FNP

## 2020-01-24 ENCOUNTER — Inpatient Hospital Stay (HOSPITAL_COMMUNITY)
Admission: EM | Admit: 2020-01-24 | Discharge: 2020-01-29 | DRG: 871 | Disposition: A | Discharge status: Home / Self Care | Payer: BC Managed Care – PPO | Attending: Internal Medicine | Admitting: Internal Medicine

## 2020-01-24 ENCOUNTER — Emergency Department (HOSPITAL_COMMUNITY): Payer: BC Managed Care – PPO

## 2020-01-24 ENCOUNTER — Other Ambulatory Visit: Payer: Self-pay

## 2020-01-24 ENCOUNTER — Encounter (HOSPITAL_COMMUNITY): Payer: Self-pay | Admitting: Emergency Medicine

## 2020-01-24 DIAGNOSIS — Z9049 Acquired absence of other specified parts of digestive tract: Secondary | ICD-10-CM

## 2020-01-24 DIAGNOSIS — I10 Essential (primary) hypertension: Secondary | ICD-10-CM | POA: Diagnosis present

## 2020-01-24 DIAGNOSIS — Z85850 Personal history of malignant neoplasm of thyroid: Secondary | ICD-10-CM | POA: Diagnosis present

## 2020-01-24 DIAGNOSIS — Z888 Allergy status to other drugs, medicaments and biological substances status: Secondary | ICD-10-CM

## 2020-01-24 DIAGNOSIS — J1282 Pneumonia due to coronavirus disease 2019: Secondary | ICD-10-CM | POA: Diagnosis not present

## 2020-01-24 DIAGNOSIS — J9601 Acute respiratory failure with hypoxia: Secondary | ICD-10-CM | POA: Diagnosis present

## 2020-01-24 DIAGNOSIS — U071 COVID-19: Secondary | ICD-10-CM | POA: Diagnosis present

## 2020-01-24 DIAGNOSIS — Z791 Long term (current) use of non-steroidal anti-inflammatories (NSAID): Secondary | ICD-10-CM

## 2020-01-24 DIAGNOSIS — A4189 Other specified sepsis: Principal | ICD-10-CM | POA: Diagnosis present

## 2020-01-24 DIAGNOSIS — Z880 Allergy status to penicillin: Secondary | ICD-10-CM

## 2020-01-24 DIAGNOSIS — A0839 Other viral enteritis: Secondary | ICD-10-CM | POA: Diagnosis present

## 2020-01-24 DIAGNOSIS — M1990 Unspecified osteoarthritis, unspecified site: Secondary | ICD-10-CM | POA: Diagnosis present

## 2020-01-24 DIAGNOSIS — K219 Gastro-esophageal reflux disease without esophagitis: Secondary | ICD-10-CM | POA: Diagnosis present

## 2020-01-24 DIAGNOSIS — C73 Malignant neoplasm of thyroid gland: Secondary | ICD-10-CM | POA: Diagnosis present

## 2020-01-24 DIAGNOSIS — Z800 Family history of malignant neoplasm of digestive organs: Secondary | ICD-10-CM

## 2020-01-24 DIAGNOSIS — Z6841 Body Mass Index (BMI) 40.0 and over, adult: Secondary | ICD-10-CM

## 2020-01-24 DIAGNOSIS — Z79899 Other long term (current) drug therapy: Secondary | ICD-10-CM

## 2020-01-24 DIAGNOSIS — E860 Dehydration: Secondary | ICD-10-CM | POA: Diagnosis present

## 2020-01-24 LAB — COMPREHENSIVE METABOLIC PANEL
ALT: 31 U/L (ref 0–44)
AST: 46 U/L — ABNORMAL HIGH (ref 15–41)
Albumin: 2.9 g/dL — ABNORMAL LOW (ref 3.5–5.0)
Alkaline Phosphatase: 72 U/L (ref 38–126)
Anion gap: 13 (ref 5–15)
BUN: 14 mg/dL (ref 8–23)
CO2: 23 mmol/L (ref 22–32)
Calcium: 8.4 mg/dL — ABNORMAL LOW (ref 8.9–10.3)
Chloride: 104 mmol/L (ref 98–111)
Creatinine, Ser: 1.04 mg/dL — ABNORMAL HIGH (ref 0.44–1.00)
GFR calc Af Amer: >60 mL/min (ref >60)
GFR calc non Af Amer: 57 mL/min — ABNORMAL LOW (ref >60)
Glucose, Bld: 94 mg/dL (ref 70–99)
Potassium: 3.8 mmol/L (ref 3.5–5.1)
Sodium: 140 mmol/L (ref 135–145)
Total Bilirubin: 0.8 mg/dL (ref 0.3–1.2)
Total Protein: 6.5 g/dL (ref 6.5–8.1)

## 2020-01-24 LAB — CBC WITH DIFFERENTIAL/PLATELET
Abs Immature Granulocytes: 0.05 10*3/uL (ref 0.00–0.07)
Basophils Absolute: 0 10*3/uL (ref 0.0–0.1)
Basophils Relative: 0 %
Eosinophils Absolute: 0 10*3/uL (ref 0.0–0.5)
Eosinophils Relative: 0 %
HCT: 42.5 % (ref 36.0–46.0)
Hemoglobin: 13.4 g/dL (ref 12.0–15.0)
Immature Granulocytes: 1 %
Lymphocytes Relative: 11 %
Lymphs Abs: 0.9 10*3/uL (ref 0.7–4.0)
MCH: 26.8 pg (ref 26.0–34.0)
MCHC: 31.5 g/dL (ref 30.0–36.0)
MCV: 85 fL (ref 80.0–100.0)
Monocytes Absolute: 0.4 10*3/uL (ref 0.1–1.0)
Monocytes Relative: 4 %
Neutro Abs: 7.4 10*3/uL (ref 1.7–7.7)
Neutrophils Relative %: 84 %
Platelets: 204 10*3/uL (ref 150–400)
RBC: 5 MIL/uL (ref 3.87–5.11)
RDW: 14.5 % (ref 11.5–15.5)
WBC: 8.8 10*3/uL (ref 4.0–10.5)
nRBC: 0 % (ref 0.0–0.2)

## 2020-01-24 LAB — C-REACTIVE PROTEIN: CRP: 19 mg/dL — ABNORMAL HIGH (ref <1.0)

## 2020-01-24 LAB — FERRITIN: Ferritin: 236 ng/mL (ref 11–307)

## 2020-01-24 LAB — FIBRINOGEN: Fibrinogen: 721 mg/dL — ABNORMAL HIGH (ref 210–475)

## 2020-01-24 LAB — LACTATE DEHYDROGENASE: LDH: 372 U/L — ABNORMAL HIGH (ref 98–192)

## 2020-01-24 LAB — TRIGLYCERIDES: Triglycerides: 98 mg/dL (ref <150)

## 2020-01-24 LAB — D-DIMER, QUANTITATIVE: D-Dimer, Quant: 0.82 ug/mL-FEU — ABNORMAL HIGH (ref 0.00–0.50)

## 2020-01-24 LAB — PROCALCITONIN: Procalcitonin: 0.57 ng/mL

## 2020-01-24 LAB — LACTIC ACID, PLASMA: Lactic Acid, Venous: 1.7 mmol/L (ref 0.5–1.9)

## 2020-01-24 MED ORDER — SODIUM CHLORIDE 0.9 % IV SOLN
100.0000 mg | Freq: Every day | INTRAVENOUS | Status: AC
  Administered 2020-01-26 – 2020-01-29 (×4): 100 mg via INTRAVENOUS
  Filled 2020-01-24 (×4): qty 20

## 2020-01-24 MED ORDER — ENOXAPARIN SODIUM 60 MG/0.6ML ~~LOC~~ SOLN
60.0000 mg | Freq: Every day | SUBCUTANEOUS | Status: DC
  Administered 2020-01-25 – 2020-01-28 (×5): 60 mg via SUBCUTANEOUS
  Filled 2020-01-24 (×6): qty 0.6

## 2020-01-24 MED ORDER — ACETAMINOPHEN 325 MG PO TABS: 650.0000 mg | ORAL_TABLET | Freq: Once | ORAL | Status: DC

## 2020-01-24 MED ORDER — HYDROCOD POLST-CPM POLST ER 10-8 MG/5ML PO SUER: 5.0000 mL | Freq: Two times a day (BID) | ORAL | Status: DC | PRN

## 2020-01-24 MED ORDER — ONDANSETRON HCL 4 MG PO TABS: 4.0000 mg | ORAL_TABLET | Freq: Four times a day (QID) | ORAL | Status: DC | PRN

## 2020-01-24 MED ORDER — SODIUM CHLORIDE 0.9 % IV SOLN
200.0000 mg | Freq: Once | INTRAVENOUS | Status: AC
  Administered 2020-01-25: 200 mg via INTRAVENOUS
  Filled 2020-01-24: qty 40

## 2020-01-24 MED ORDER — LACTATED RINGERS IV SOLN: INTRAVENOUS | Status: DC

## 2020-01-24 MED ORDER — PREDNISONE 20 MG PO TABS
50.0000 mg | ORAL_TABLET | Freq: Every day | ORAL | Status: DC
  Administered 2020-01-28 – 2020-01-29 (×2): 50 mg via ORAL
  Filled 2020-01-24 (×2): qty 2

## 2020-01-24 MED ORDER — ASCORBIC ACID 500 MG PO TABS
500.0000 mg | ORAL_TABLET | Freq: Every day | ORAL | Status: DC
  Administered 2020-01-25 – 2020-01-29 (×5): 500 mg via ORAL
  Filled 2020-01-24 (×5): qty 1

## 2020-01-24 MED ORDER — ZINC SULFATE 220 (50 ZN) MG PO CAPS
220.0000 mg | ORAL_CAPSULE | Freq: Every day | ORAL | Status: DC
  Administered 2020-01-25 – 2020-01-29 (×5): 220 mg via ORAL
  Filled 2020-01-24 (×5): qty 1

## 2020-01-24 MED ORDER — ACETAMINOPHEN 325 MG PO TABS: 650.0000 mg | ORAL_TABLET | Freq: Four times a day (QID) | ORAL | Status: DC | PRN

## 2020-01-24 MED ORDER — ONDANSETRON HCL 4 MG/2ML IJ SOLN
4.0000 mg | Freq: Four times a day (QID) | INTRAMUSCULAR | Status: DC | PRN
  Administered 2020-01-25: 4 mg via INTRAVENOUS
  Filled 2020-01-24: qty 2

## 2020-01-24 MED ORDER — GUAIFENESIN-DM 100-10 MG/5ML PO SYRP
10.0000 mL | ORAL_SOLUTION | ORAL | Status: DC | PRN
  Administered 2020-01-25: 10 mL via ORAL
  Filled 2020-01-24: qty 10

## 2020-01-24 MED ORDER — ACETAMINOPHEN 500 MG PO TABS
1000.0000 mg | ORAL_TABLET | Freq: Once | ORAL | Status: AC
  Administered 2020-01-24: 1000 mg via ORAL
  Filled 2020-01-24: qty 2

## 2020-01-24 MED ORDER — METHYLPREDNISOLONE SODIUM SUCC 125 MG IJ SOLR
0.5000 mg/kg | Freq: Two times a day (BID) | INTRAMUSCULAR | Status: AC
  Administered 2020-01-25 – 2020-01-27 (×6): 60.625 mg via INTRAVENOUS
  Filled 2020-01-24 (×6): qty 2

## 2020-01-24 MED ORDER — LACTATED RINGERS IV BOLUS
2000.0000 mL | Freq: Once | INTRAVENOUS | Status: AC
  Administered 2020-01-24: 2000 mL via INTRAVENOUS

## 2020-01-24 MED ORDER — IPRATROPIUM-ALBUTEROL 20-100 MCG/ACT IN AERS
1.0000 | INHALATION_SPRAY | Freq: Four times a day (QID) | RESPIRATORY_TRACT | Status: DC
  Administered 2020-01-25 – 2020-01-29 (×14): 1 via RESPIRATORY_TRACT
  Filled 2020-01-24 (×2): qty 4

## 2020-01-24 NOTE — ED Triage Notes (Signed)
Pt tasted Covid since Friday, c/o increase SOB and generalized body aches, fever and diarrhea.

## 2020-01-24 NOTE — H&P (Signed)
History and Physical    Toni Francis:[id number] DOB: 1955/09/29 DOA: 01/24/2020  PCP: Claretta Fraise, MD  Patient coming from: Home  I have personally briefly reviewed patient's old medical records in [place]  Chief Complaint: fever, myalgias, positive Covid test  HPI: Toni Francis is a 64 y.o. female with medical history significant for hypertension, GERD, right papillary thyroid carcinoma s/p right hemithyroidectomy who presents to the ED for evaluation of generalized weakness, myalgias, cough, fevers in setting of recent positive COVID-19 testing.  Patient has been fully vaccinated against COVID-19 and received second dose of the Pfizer vaccine 07/14/2019.  Patient states she tested positive for COVID-19 on 9/24 at an outside testing center.  She has been having generalized weakness, fevers, chills, body aches, and cough occasionally productive of yellow sputum.  She denies having significant shortness of breath.  Her symptoms have been progressive over the last week.  She has had poor appetite and not eating well.  She says she has not been able to take medications due to nausea and vomiting.  She has had some diarrhea.  She says she is becoming easily fatigued.  Of note, she says her father is currently in the hospital for Covid pneumonia.  ED Course:  Initial vitals showed BP 116/83, pulse 87, RR 22, temp 102.7 Fahrenheit, SPO2 95% on room air.  While in the ED patient became transiently hypotensive to 89/77.  Labs showed sodium 140, potassium 3.8, bicarb 23, BUN 14, creatinine 1.04, serum glucose 94, AST 46, ALT 31, alk phos 72, total bilirubin 0.8, WBC 8.8, hemoglobin 13.4, platelets 204,000, D-dimer 0.82, LDH 372, ferritin 236, fibrinogen 721, CRP 19.0, lactic acid 1.7.  Blood cultures were obtained and pending.  Respiratory panel is positive for SARS-CoV-2.  Influenza a and B are negative.  Portable chest x-ray showed patchy bilateral groundglass opacities and  consolidation.  Patient was given 2 L LR and Tylenol.  The hospitalist service was consulted to admit for further evaluation and management.  Review of Systems: All systems reviewed and are negative except as documented in history of present illness above.   Past Medical History:  Diagnosis Date  . Abdominal hernia   . Arthritis   . GERD (gastroesophageal reflux disease)   . Hypertension   . Pinched nerve    L4-5,back    Past Surgical History:  Procedure Laterality Date  . CHOLECYSTECTOMY    . THYROIDECTOMY Right 08/13/2019   Procedure: RIGHT THYROID LOBECTOMY;  Surgeon: Rozetta Nunnery, MD;  Location: [facility] OR;  Service: ENT;  Laterality: Right;    Social History:  reports that she has never smoked. She has never used smokeless tobacco. She reports that she does not drink alcohol and does not use drugs.  Allergies  Allergen Reactions  . Ampicillin Rash    Did it involve swelling of the face/tongue/throat, SOB, or low BP? No Did it involve sudden or severe rash/hives, skin peeling, or any reaction on the inside of your mouth or nose? Yes Did you need to seek medical attention at a hospital or doctor's office? Yes When did it last happen?10 + years If all above answers are "NO", may proceed with cephalosporin use.   . Codeine Nausea And Vomiting and Other (See Comments)    Also causes headaches   . Pseudoephedrine Palpitations    Family History  Problem Relation Age of Onset  . Colon cancer Maternal Uncle 50     Prior to Admission medications  Medication Sig Start Date End Date Taking? Authorizing Provider  acetaminophen (TYLENOL) 500 MG tablet Take 1,000 mg by mouth daily as needed for moderate pain or headache.    [provider]  bacitracin ointment Apply 1 application topically every 8 (eight) hours. 08/14/19   Rozetta Nunnery, MD  Calcium Citrate-Vitamin D (CALCIUM + D PO) Take 1 tablet by mouth daily.    [provider]    DULoxetine (CYMBALTA) 30 MG capsule Take 1 capsule (30 mg total) by mouth daily. 01/18/19   Claretta Fraise, MD  fluticasone Asencion Islam) 50 MCG/ACT nasal spray USE 2 SPRAY(S) IN EACH NOSTRIL TWICE DAILY 12/24/19   Claretta Fraise, MD  levocetirizine (XYZAL) 5 MG tablet TAKE 1 TABLET BY MOUTH IN THE EVENING FOR ALLERGIES 10/18/19   Claretta Fraise, MD  meloxicam [facility]) 15 MG tablet Take 1 tablet by mouth once daily 10/18/19   Claretta Fraise, MD  moxifloxacin (AVELOX) 400 MG tablet Take 1 tablet (400 mg total) by mouth daily. Take all of these, for infection 05/25/19   Claretta Fraise, MD  Multiple Vitamins-Minerals (CENTRUM SILVER PO) Take 1 tablet by mouth daily.    [provider]  Multiple Vitamins-Minerals (EYE VITAMINS PO) Take 1 capsule by mouth daily.    [provider]  Omega-3 Fatty Acids (FISH OIL PO) Take 1 capsule by mouth daily.     [provider]  omeprazole (PRILOSEC) 20 MG capsule Take 20 mg by mouth every evening.    [provider]  predniSONE (DELTASONE) 10 MG tablet Take 5 daily for 3 days followed by 4,3,2 and 1 for 3 days each. 05/25/19   Claretta Fraise, MD  triamterene-hydrochlorothiazide (MAXZIDE-25) 37.5-25 MG tablet Take 1 tablet by mouth daily. For blood pressure and fluid 05/25/19   Claretta Fraise, MD    Physical Exam: Vitals:   01/24/20 1950 01/24/20 2205 01/24/20 2214 01/24/20 2315  BP: 104/80 98/73  113/62  Pulse: 79 76  74  Resp: (!) 23   (!) 24  Temp: (!) 101.5 F (38.6 C)  98.9 F (37.2 C) 98.5 F (36.9 C)  TempSrc: Oral  Oral Oral  SpO2: 94% 93%  92%  Weight:      Height:       Constitutional: Resting in bed, NAD, calm, appears tired but comfortable Eyes: PERRL, lids and conjunctivae normal ENMT: Mucous membranes are moist. Posterior pharynx clear of any exudate or lesions.Normal dentition.  Neck: normal, supple, no masses. Respiratory: Faint coarse breath sounds bilaterally.  Slight increased respiratory effort. No  accessory muscle use.  Cardiovascular: Regular rate and rhythm, no murmurs / rubs / gallops. No extremity edema. 2+ pedal pulses. Abdomen: no tenderness, no masses palpated. No hepatosplenomegaly. Bowel sounds positive.  Musculoskeletal: no clubbing / cyanosis. No joint deformity upper and lower extremities. Good ROM, no contractures. Normal muscle tone.  Skin: Diaphoretic.  No rashes, lesions, ulcers. No induration Neurologic: CN 2-12 grossly intact. Sensation intact, Strength 5/5 in all 4.  Psychiatric: Normal judgment and insight. Alert and oriented x 3. Normal mood.     Labs on Admission: I have personally reviewed following labs and imaging studies  CBC: Recent Labs  Lab 01/24/20 1630  WBC 8.8  NEUTROABS 7.4  HGB 13.4  HCT 42.5  MCV 85.0  PLT 098   Basic Metabolic Panel: Recent Labs  Lab 01/24/20 1630  NA 140  K 3.8  CL 104  CO2 23  GLUCOSE 94  BUN 14  CREATININE 1.04*  CALCIUM 8.4*  GFR: Estimated Creatinine Clearance: 74.6 mL/min (A) (by C-G formula based on SCr of 1.04 mg/dL (H)). Liver Function Tests: Recent Labs  Lab 01/24/20 1630  AST 46*  ALT 31  ALKPHOS 72  BILITOT 0.8  PROT 6.5  ALBUMIN 2.9*   No results for input(s): LIPASE, AMYLASE in the last 168 hours. No results for input(s): AMMONIA in the last 168 hours. Coagulation Profile: No results for input(s): INR, PROTIME in the last 168 hours. Cardiac Enzymes: No results for input(s): CKTOTAL, CKMB, CKMBINDEX, TROPONINI in the last 168 hours. BNP (last 3 results) No results for input(s): PROBNP in the last 8760 hours. HbA1C: No results for input(s): HGBA1C in the last 72 hours. CBG: No results for input(s): GLUCAP in the last 168 hours. Lipid Profile: Recent Labs    01/24/20 2130  TRIG 98   Thyroid Function Tests: No results for input(s): TSH, T4TOTAL, FREET4, T3FREE, THYROIDAB in the last 72 hours. Anemia Panel: Recent Labs    01/24/20 2130  FERRITIN 236   Urine analysis:     Component Value Date/Time   COLORURINE YELLOW 08/18/2007 0405   APPEARANCEUR Clear 10/21/2017 1123   LABSPEC 1.031 (H) 08/18/2007 0405   PHURINE 6.0 08/18/2007 0405   GLUCOSEU Negative 10/21/2017 1123   HGBUR TRACE (A) 08/18/2007 0405   BILIRUBINUR Negative 10/21/2017 1123   KETONESUR TRACE (A) 08/18/2007 0405   PROTEINUR Negative 10/21/2017 1123   PROTEINUR NEGATIVE 08/18/2007 0405   UROBILINOGEN 0.2 08/18/2007 0405   NITRITE Negative 10/21/2017 1123   NITRITE NEGATIVE 08/18/2007 0405   LEUKOCYTESUR 1+ (A) 10/21/2017 1123    Radiological Exams on Admission: DG Chest Port 1 View  Result Date: 01/24/2020 CLINICAL DATA:  Cough EXAM: PORTABLE CHEST 1 VIEW COMPARISON:  11/17/2007 FINDINGS: Patchy bilateral ground-glass opacity and consolidation consistent with pneumonia. No pleural effusion. Mild cardiomegaly. No pneumothorax. IMPRESSION: Patchy bilateral ground-glass opacity and consolidation consistent with pneumonia, appearance suspicious for atypical or viral pneumonia. Electronically Signed   By: Donavan Foil M.D.   On: 01/24/2020 22:53    EKG: Independently reviewed. Sinus rhythm, nonspecific T wave change in lead III.  Not significantly changed when compared to prior.  Assessment/Plan Principal Problem:   Pneumonia due to COVID-19 virus Active Problems:   Thyroid cancer ([place])   Hypertension  QUINCEE GITTENS is a 64 y.o. female with medical history significant for hypertension, GERD, right papillary thyroid carcinoma s/p right hemithyroidectomy who is admitted with COVID-19 pneumonia.  COVID-19 pneumonia and gastroenteritis: Patient reports positive COVID-19 test 9/24, SARS-CoV-2 PCR is positive here 9/30.  Chest x-ray shows patchy bilateral groundglass opacities and consolidation consistent with Covid pneumonia.  O2 saturation is 90-92% while on room air.  She has been previously fully vaccinated against COVID-19.  Given ongoing symptoms and chest x-ray findings, will start on  directed therapy with steroids and remdesivir. -Start IV remdesivir -Start IV Solu-Medrol 0.5 mg/kg q12h -Place on supplemental oxygen -Incentive spirometer, flutter valve, Combivent -Antitussives, vitamin C, zinc -Continue IV fluid hydration overnight as she is currently dehydrated  Hypertension: Hold home antihypertensives due to soft blood pressure.  Right papillary thyroid carcinoma s/p hemithyroidectomy 08/13/2019: Follows with ENT, Dr. Lucia Gaskins.  Check TSH.  DVT prophylaxis: Lovenox Code Status: Full code, confirmed with patient Family Communication: Discussed with patient, she has discussed with family Disposition Plan: From home, dispo likely to home pending symptomatic improvement and ability to transition to outpatient therapy Consults called: None Admission status:  Status is: Observation  The patient remains OBS  appropriate and will d/c before 2 midnights.  Dispo: The patient is from: Home              Anticipated d/c is to: Home              Anticipated d/c date is: 1 day              Patient currently is not medically stable to d/c.    Zada Finders MD [facility]  If 7PM-7AM, please contact night-coverage [email or web address]  01/25/2020, 12:08 AM

## 2020-01-24 NOTE — ED Provider Notes (Signed)
[facility] EMERGENCY DEPARTMENT Provider Note   CSN: [id number] Arrival date & time: 01/24/20  1532     History Chief Complaint  Patient presents with  . Shortness of Breath    covid    Toni Francis is a 64 y.o. female.  This is a 64 year old female who is fully vaccinated who was diagnosed with Covid several days ago.  States that she has had some emesis as well as watery diarrhea.  The vomiting has improved but the diarrhea remains.  Denies any abdominal pain.  No urinary symptoms.  She notes diffuse weakness.  Has had a cough as well as fever and malaise.  Denies any dyspnea on exertion or shortness of breath.  Has not really been moving around very much according to her.  Called her doctor who told her to come in.        Past Medical History:  Diagnosis Date  . Abdominal hernia   . Arthritis   . GERD (gastroesophageal reflux disease)   . Hypertension   . Pinched nerve    L4-5,back    Patient Active Problem List   Diagnosis Date Noted  . Thyroid cancer ([place]) 08/13/2019  . Thyroid mass 08/13/2019  . Allergic rhinitis 10/14/2015    Past Surgical History:  Procedure Laterality Date  . CHOLECYSTECTOMY    . THYROIDECTOMY Right 08/13/2019   Procedure: RIGHT THYROID LOBECTOMY;  Surgeon: Rozetta Nunnery, MD;  Location: [facility];  Service: ENT;  Laterality: Right;     OB History   No obstetric history on file.     Family History  Problem Relation Age of Onset  . Colon cancer Maternal Uncle 41    Social History   Tobacco Use  . Smoking status: Never Smoker  . Smokeless tobacco: Never Used  Vaping Use  . Vaping Use: Never used  Substance Use Topics  . Alcohol use: No  . Drug use: No    Home Medications Prior to Admission medications   Medication Sig Start Date End Date Taking? Authorizing Provider  acetaminophen (TYLENOL) 500 MG tablet Take 1,000 mg by mouth daily as needed for moderate pain or headache.    [provider]  bacitracin ointment Apply 1 application topically every 8 (eight) hours. 08/14/19   Rozetta Nunnery, MD  Calcium Citrate-Vitamin D (CALCIUM + D PO) Take 1 tablet by mouth daily.    [provider]  DULoxetine (CYMBALTA) 30 MG capsule Take 1 capsule (30 mg total) by mouth daily. 01/18/19   Claretta Fraise, MD  fluticasone Asencion Islam) 50 MCG/ACT nasal spray USE 2 SPRAY(S) IN EACH NOSTRIL TWICE DAILY 12/24/19   Claretta Fraise, MD  levocetirizine (XYZAL) 5 MG tablet TAKE 1 TABLET BY MOUTH IN THE EVENING FOR ALLERGIES 10/18/19   Claretta Fraise, MD  meloxicam [facility]) 15 MG tablet Take 1 tablet by mouth once daily 10/18/19   Claretta Fraise, MD  moxifloxacin (AVELOX) 400 MG tablet Take 1 tablet (400 mg total) by mouth daily. Take all of these, for infection 05/25/19   Claretta Fraise, MD  Multiple Vitamins-Minerals (CENTRUM SILVER PO) Take 1 tablet by mouth daily.    [provider]  Multiple Vitamins-Minerals (EYE VITAMINS PO) Take 1 capsule by mouth daily.    [provider]  Omega-3 Fatty Acids (FISH OIL PO) Take 1 capsule by mouth daily.     [provider]  omeprazole (PRILOSEC) 20 MG capsule Take 20 mg by mouth every evening.  [provider]  predniSONE (DELTASONE) 10 MG tablet Take 5 daily for 3 days followed by 4,3,2 and 1 for 3 days each. 05/25/19   Claretta Fraise, MD  triamterene-hydrochlorothiazide (MAXZIDE-25) 37.5-25 MG tablet Take 1 tablet by mouth daily. For blood pressure and fluid 05/25/19   Claretta Fraise, MD    Allergies    Ampicillin, Codeine, and Pseudoephedrine  Review of Systems   Review of Systems  All other systems reviewed and are negative.   Physical Exam Updated Vital Signs BP 104/80 (BP Location: Left Arm)   Pulse 79   Temp (!) 101.5 F (38.6 C) (Oral)   Resp (!) 23   Ht 1.702 m (5\' 7" )   Wt 121.2 kg   SpO2 94%   BMI 41.85 kg/m   Physical Exam Vitals and nursing note reviewed.  Constitutional:      General: She  is not in acute distress.    Appearance: Normal appearance. She is well-developed. She is not toxic-appearing.  HENT:     Head: Normocephalic and atraumatic.  Eyes:     General: Lids are normal.     Conjunctiva/sclera: Conjunctivae normal.     Pupils: Pupils are equal, round, and reactive to light.  Neck:     Thyroid: No thyroid mass.     Trachea: No tracheal deviation.  Cardiovascular:     Rate and Rhythm: Normal rate and regular rhythm.     Heart sounds: Normal heart sounds. No murmur heard.  No gallop.   Pulmonary:     Effort: Pulmonary effort is normal. No respiratory distress.     Breath sounds: Normal breath sounds. No stridor. No decreased breath sounds, wheezing, rhonchi or rales.  Abdominal:     General: Bowel sounds are normal. There is no distension.     Palpations: Abdomen is soft.     Tenderness: There is no abdominal tenderness. There is no rebound.  Musculoskeletal:        General: No tenderness. Normal range of motion.     Cervical back: Normal range of motion and neck supple.  Skin:    General: Skin is warm and dry.     Findings: No abrasion or rash.  Neurological:     Mental Status: She is alert and oriented to person, place, and time.     GCS: GCS eye subscore is 4. GCS verbal subscore is 5. GCS motor subscore is 6.     Cranial Nerves: No cranial nerve deficit.     Sensory: No sensory deficit.  Psychiatric:        Speech: Speech normal.        Behavior: Behavior normal.     ED Results / Procedures / Treatments   Labs (all labs ordered are listed, but only abnormal results are displayed) Labs Reviewed  COMPREHENSIVE METABOLIC PANEL - Abnormal; Notable for the following components:      Result Value   Creatinine, Ser 1.04 (*)    Calcium 8.4 (*)    Albumin 2.9 (*)    AST 46 (*)    GFR calc non Af Amer 57 (*)    All other components within normal limits  RESPIRATORY PANEL BY RT PCR (FLU A&B, COVID)  CULTURE, BLOOD (ROUTINE X 2)  CULTURE, BLOOD  (ROUTINE X 2)  CBC WITH DIFFERENTIAL/PLATELET  LACTIC ACID, PLASMA  LACTIC ACID, PLASMA  D-DIMER, QUANTITATIVE (NOT AT [facility])  PROCALCITONIN  LACTATE DEHYDROGENASE  FERRITIN  TRIGLYCERIDES  FIBRINOGEN  C-REACTIVE PROTEIN    EKG  None  Radiology No results found.  Procedures Procedures (including critical care time)  Medications Ordered in ED Medications  lactated ringers bolus 2,000 mL (has no administration in time range)  acetaminophen (TYLENOL) tablet 650 mg (has no administration in time range)  acetaminophen (TYLENOL) tablet 1,000 mg (1,000 mg Oral Given 01/24/20 1611)    ED Course  I have reviewed the triage vital signs and the nursing notes.  Pertinent labs & imaging results that were available during my care of the patient were reviewed by me and considered in my medical decision making (see chart for details).    MDM Rules/Calculators/A&P                          Patient's chest x-ray consistent with Covid pneumonia.  Labs are noted here.  Patient's blood pressure is soft here and she was given 2 L of lactated Ringer's.  Patient endorses weakness.  Will admit for observation Final Clinical Impression(s) / ED Diagnoses Final diagnoses:  None    Rx / DC Orders ED Discharge Orders    None       Lacretia Leigh, MD 01/24/20 2300

## 2020-01-25 DIAGNOSIS — J9601 Acute respiratory failure with hypoxia: Secondary | ICD-10-CM | POA: Diagnosis present

## 2020-01-25 DIAGNOSIS — I10 Essential (primary) hypertension: Secondary | ICD-10-CM | POA: Diagnosis present

## 2020-01-25 DIAGNOSIS — Z9049 Acquired absence of other specified parts of digestive tract: Secondary | ICD-10-CM | POA: Diagnosis not present

## 2020-01-25 DIAGNOSIS — C73 Malignant neoplasm of thyroid gland: Secondary | ICD-10-CM

## 2020-01-25 DIAGNOSIS — A4189 Other specified sepsis: Secondary | ICD-10-CM | POA: Diagnosis present

## 2020-01-25 DIAGNOSIS — U071 COVID-19: Secondary | ICD-10-CM | POA: Diagnosis present

## 2020-01-25 DIAGNOSIS — M1990 Unspecified osteoarthritis, unspecified site: Secondary | ICD-10-CM | POA: Diagnosis present

## 2020-01-25 DIAGNOSIS — Z880 Allergy status to penicillin: Secondary | ICD-10-CM | POA: Diagnosis not present

## 2020-01-25 DIAGNOSIS — Z79899 Other long term (current) drug therapy: Secondary | ICD-10-CM | POA: Diagnosis not present

## 2020-01-25 DIAGNOSIS — K219 Gastro-esophageal reflux disease without esophagitis: Secondary | ICD-10-CM | POA: Diagnosis present

## 2020-01-25 DIAGNOSIS — E860 Dehydration: Secondary | ICD-10-CM | POA: Diagnosis present

## 2020-01-25 DIAGNOSIS — A0839 Other viral enteritis: Secondary | ICD-10-CM | POA: Diagnosis present

## 2020-01-25 DIAGNOSIS — Z800 Family history of malignant neoplasm of digestive organs: Secondary | ICD-10-CM | POA: Diagnosis not present

## 2020-01-25 DIAGNOSIS — Z888 Allergy status to other drugs, medicaments and biological substances status: Secondary | ICD-10-CM | POA: Diagnosis not present

## 2020-01-25 DIAGNOSIS — Z791 Long term (current) use of non-steroidal anti-inflammatories (NSAID): Secondary | ICD-10-CM | POA: Diagnosis not present

## 2020-01-25 DIAGNOSIS — Z6841 Body Mass Index (BMI) 40.0 and over, adult: Secondary | ICD-10-CM | POA: Diagnosis not present

## 2020-01-25 DIAGNOSIS — J1282 Pneumonia due to coronavirus disease 2019: Secondary | ICD-10-CM | POA: Diagnosis present

## 2020-01-25 LAB — COMPREHENSIVE METABOLIC PANEL
ALT: 30 U/L (ref 0–44)
AST: 45 U/L — ABNORMAL HIGH (ref 15–41)
Albumin: 2.5 g/dL — ABNORMAL LOW (ref 3.5–5.0)
Alkaline Phosphatase: 65 U/L (ref 38–126)
Anion gap: 13 (ref 5–15)
BUN: 14 mg/dL (ref 8–23)
CO2: 22 mmol/L (ref 22–32)
Calcium: 8.1 mg/dL — ABNORMAL LOW (ref 8.9–10.3)
Chloride: 104 mmol/L (ref 98–111)
Creatinine, Ser: 0.78 mg/dL (ref 0.44–1.00)
GFR calc Af Amer: >60 mL/min (ref >60)
GFR calc non Af Amer: >60 mL/min (ref >60)
Glucose, Bld: 101 mg/dL — ABNORMAL HIGH (ref 70–99)
Potassium: 3.6 mmol/L (ref 3.5–5.1)
Sodium: 139 mmol/L (ref 135–145)
Total Bilirubin: 1 mg/dL (ref 0.3–1.2)
Total Protein: 5.9 g/dL — ABNORMAL LOW (ref 6.5–8.1)

## 2020-01-25 LAB — CBC WITH DIFFERENTIAL/PLATELET
Abs Immature Granulocytes: 0.03 10*3/uL (ref 0.00–0.07)
Basophils Absolute: 0 10*3/uL (ref 0.0–0.1)
Basophils Relative: 0 %
Eosinophils Absolute: 0 10*3/uL (ref 0.0–0.5)
Eosinophils Relative: 0 %
HCT: 39.4 % (ref 36.0–46.0)
Hemoglobin: 12.2 g/dL (ref 12.0–15.0)
Immature Granulocytes: 1 %
Lymphocytes Relative: 12 %
Lymphs Abs: 0.4 10*3/uL — ABNORMAL LOW (ref 0.7–4.0)
MCH: 26.8 pg (ref 26.0–34.0)
MCHC: 31 g/dL (ref 30.0–36.0)
MCV: 86.6 fL (ref 80.0–100.0)
Monocytes Absolute: 0.2 10*3/uL (ref 0.1–1.0)
Monocytes Relative: 5 %
Neutro Abs: 3 10*3/uL (ref 1.7–7.7)
Neutrophils Relative %: 82 %
Platelets: 173 10*3/uL (ref 150–400)
RBC: 4.55 MIL/uL (ref 3.87–5.11)
RDW: 14.5 % (ref 11.5–15.5)
WBC: 3.6 10*3/uL — ABNORMAL LOW (ref 4.0–10.5)
nRBC: 0 % (ref 0.0–0.2)

## 2020-01-25 LAB — FERRITIN: Ferritin: 213 ng/mL (ref 11–307)

## 2020-01-25 LAB — MAGNESIUM: Magnesium: 1.8 mg/dL (ref 1.7–2.4)

## 2020-01-25 LAB — C-REACTIVE PROTEIN: CRP: 16.6 mg/dL — ABNORMAL HIGH (ref <1.0)

## 2020-01-25 LAB — RESPIRATORY PANEL BY RT PCR (FLU A&B, COVID)
Influenza A by PCR: NEGATIVE
Influenza B by PCR: NEGATIVE
SARS Coronavirus 2 by RT PCR: POSITIVE — AB

## 2020-01-25 LAB — PROCALCITONIN: Procalcitonin: 0.41 ng/mL

## 2020-01-25 LAB — D-DIMER, QUANTITATIVE: D-Dimer, Quant: 0.8 ug/mL-FEU — ABNORMAL HIGH (ref 0.00–0.50)

## 2020-01-25 LAB — TSH: TSH: 5.723 u[IU]/mL — ABNORMAL HIGH (ref 0.350–4.500)

## 2020-01-25 LAB — PHOSPHORUS: Phosphorus: 2.8 mg/dL (ref 2.5–4.6)

## 2020-01-25 LAB — HIV ANTIBODY (ROUTINE TESTING W REFLEX): HIV Screen 4th Generation wRfx: NONREACTIVE

## 2020-01-25 MED ORDER — FLUTICASONE PROPIONATE 50 MCG/ACT NA SUSP
1.0000 | Freq: Every day | NASAL | Status: DC
  Administered 2020-01-26 – 2020-01-29 (×4): 1 via NASAL
  Filled 2020-01-25: qty 16

## 2020-01-25 MED ORDER — PANTOPRAZOLE SODIUM 40 MG PO TBEC
40.0000 mg | DELAYED_RELEASE_TABLET | Freq: Every day | ORAL | Status: DC
  Administered 2020-01-25 – 2020-01-28 (×4): 40 mg via ORAL
  Filled 2020-01-25 (×4): qty 1

## 2020-01-25 MED ORDER — MELOXICAM 7.5 MG PO TABS
15.0000 mg | ORAL_TABLET | Freq: Every day | ORAL | Status: DC
  Administered 2020-01-25 – 2020-01-29 (×5): 15 mg via ORAL
  Filled 2020-01-25 (×8): qty 2

## 2020-01-25 MED ORDER — TRIAMTERENE-HCTZ 37.5-25 MG PO TABS
1.0000 | ORAL_TABLET | Freq: Every day | ORAL | Status: DC
  Administered 2020-01-26 – 2020-01-29 (×4): 1 via ORAL
  Filled 2020-01-25 (×4): qty 1

## 2020-01-25 NOTE — ED Notes (Signed)
Attempted to call report. Number left. Nurse will call back

## 2020-01-25 NOTE — ED Notes (Signed)
bfast ordered 

## 2020-01-25 NOTE — Progress Notes (Addendum)
PROGRESS NOTE  Toni Francis YKD:[id number] DOB: Jul 06, 1955 DOA: 01/24/2020 PCP: Claretta Fraise, MD   LOS: 0 days   Brief Narrative / Interim history: 64 year old female with history of HTN, papillary thyroid carcinoma status post hemithyroidectomy, came into the hospital with generalized weakness, myalgias, cough, fevers.  She was diagnosed with COVID-19, she was febrile to 102.7, hypoxic into the 80s on room air requiring 2 L nasal cannula, chest x-ray was concerning for pneumonia and was admitted to the hospital.  She was started on remdesivir and steroids.  She was fully vaccinated  Subjective / 24h Interval events: She is telling me that she is feeling better this morning in the sense that she has more energy.  Denies any shortness of breath at rest but gets pretty short winded with activities.  Assessment & Plan:  Principal Problem Sepsis due to, Acute Hypoxic Respiratory Failure due to Covid-19 Viral Illness -met sepsis criteria with fever, tachypnea, source -Patient hypoxic in the ED requiring 2 L nasal cannula.  Chest x-ray showed patchy bilateral groundglass opacities. 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  Monitor as an outpatient.  Scheduled Meds: . vitamin C  500 mg Oral Daily  . enoxaparin (LOVENOX) injection  60 mg Subcutaneous QHS  . Ipratropium-Albuterol  1 puff Inhalation Q6H  . methylPREDNISolone (SOLU-MEDROL) injection  0.5 mg/kg Intravenous Q12H    Followed by  . [START ON 01/28/2020] predniSONE  50 mg Oral Daily  . zinc sulfate  220 mg Oral Daily   Continuous Infusions: . [START ON 01/26/2020] remdesivir 100 mg in NS 100 mL     PRN Meds:.acetaminophen, chlorpheniramine-HYDROcodone, guaiFENesin-dextromethorphan, ondansetron **OR** ondansetron (ZOFRAN) IV  DVT prophylaxis: Lovenox Code Status: Full code Family Communication: no family present, d/w patient   Status is: Observation  The patient will require care spanning > 2 midnights and should be moved to inpatient because: Inpatient level of care appropriate due to severity of illness  Dispo: The patient is from: Home              Anticipated d/c is to: Home              Anticipated d/c date is: 2 days              Patient currently is not medically stable to d/c.  Consultants:  None   Procedures:  None   Microbiology: None   Antibacterials: None    Objective: Vitals:   01/24/20 2315 01/25/20 0130 01/25/20 0500 01/25/20 0600  BP: 113/62 126/63 127/76 131/66  Pulse: 74 77 71 78  Resp: (!) 24 (!) 23 19   Temp: 98.5 F (36.9 C)     TempSrc: Oral     SpO2: 92% (!) 89% 93% 93%  Weight:      Height:        Intake/Output Summary (Last 24 hours) at 01/25/2020 1130 Last data filed at 01/25/2020 0154 Gross per 24 hour  Intake 2000 ml  Output --  Net 2000  ml   Filed Weights   01/26/20 1608  Weight: 121.2 kg    Examination:  Constitutional: NAD Eyes: no scleral icterus ENMT: Mucous membranes are moist.  Neck: normal, supple Respiratory: bibasilar rhonchi, no wheezing, no crackles. Moves air well Cardiovascular: Regular rate and rhythm, no murmurs / rubs / gallops. No LE edema.  Abdomen: non distended, no tenderness. Bowel sounds positive.  Musculoskeletal: no clubbing / cyanosis.  Skin: no rashes Neurologic: CN 2-12 grossly intact. Strength 5/5 in all 4.   Data Reviewed: I have independently reviewed following labs and imaging studies   CBC: Recent  Labs  Lab 2020/01/26 1630 01/25/20 0500  WBC 8.8 3.6*  NEUTROABS 7.4 3.0  HGB 13.4 12.2  HCT 42.5 39.4  MCV 85.0 86.6  PLT 204 277   Basic Metabolic Panel: Recent Labs  Lab 2020-01-26 1630 01/25/20 0500  NA 140 139  K 3.8 3.6  CL 104 104  CO2 23 22  GLUCOSE 94 101*  BUN 14 14  CREATININE 1.04* 0.78  CALCIUM 8.4* 8.1*  MG  --  1.8  PHOS  --  2.8   GFR: Estimated Creatinine Clearance: 97 mL/min (by C-G formula based on SCr of 0.78 mg/dL). Liver Function Tests: Recent Labs  Lab 01/26/20 1630 01/25/20 0500  AST 46* 45*  ALT 31 30  ALKPHOS 72 65  BILITOT 0.8 1.0  PROT 6.5 5.9*  ALBUMIN 2.9* 2.5*   No results for input(s): LIPASE, AMYLASE in the last 168 hours. No results for input(s): AMMONIA in the last 168 hours. Coagulation Profile: No results for input(s): INR, PROTIME in the last 168 hours. Cardiac Enzymes: No results for input(s): CKTOTAL, CKMB, CKMBINDEX, TROPONINI in the last 168 hours. BNP (last 3 results) No results for input(s): PROBNP in the last 8760 hours. HbA1C: No results for input(s): HGBA1C in the last 72 hours. CBG: No results for input(s): GLUCAP in the last 168 hours. Lipid Profile: Recent Labs    2020/01/26 2130  TRIG 98   Thyroid Function Tests: Recent Labs    January 26, 2020 2359  TSH 5.723*   Anemia Panel: Recent Labs    26-Jan-2020 2130 01/25/20 0500  FERRITIN 236 213   Urine analysis:    Component Value Date/Time   COLORURINE YELLOW 08/18/2007 0405   APPEARANCEUR Clear 10/21/2017 1123   LABSPEC 1.031 (H) 08/18/2007 0405   PHURINE 6.0 08/18/2007 0405   GLUCOSEU Negative 10/21/2017 1123   HGBUR TRACE (A) 08/18/2007 0405   BILIRUBINUR Negative 10/21/2017 1123   KETONESUR TRACE (A) 08/18/2007 0405   PROTEINUR Negative 10/21/2017 1123   PROTEINUR NEGATIVE 08/18/2007 0405   UROBILINOGEN 0.2 08/18/2007 0405   NITRITE Negative 10/21/2017 1123   NITRITE NEGATIVE 08/18/2007 0405   LEUKOCYTESUR 1+ (A) 10/21/2017 1123   Sepsis  Labs: Invalid input(s): PROCALCITONIN, LACTICIDVEN  Recent Results (from the past 240 hour(s))  Respiratory Panel by RT PCR (Flu A&B, Covid) - Nasopharyngeal Swab     Status: Abnormal   Collection Time: 26-Jan-2020  9:30 PM   Specimen: Nasopharyngeal Swab  Result Value Ref Range Status   SARS Coronavirus 2 by RT PCR POSITIVE (A) NEGATIVE Final    Comment: RESULT CALLED TO, READ BACK BY AND VERIFIED WITH: T [facility] RN 01/25/20 AT 0006 SK (NOTE) SARS-CoV-2 target nucleic acids are DETECTED.  SARS-CoV-2 RNA is generally detectable in upper respiratory specimens  during the acute phase of infection. Positive results are indicative of the presence of the identified virus, but do not rule out bacterial  infection or co-infection with other pathogens not detected by the test. Clinical correlation with patient history and other diagnostic information is necessary to determine patient infection status. The expected result is Negative.  Fact Sheet for Patients:  PinkCheek.be  Fact Sheet for Healthcare Providers: GravelBags.it  This test is not yet approved or cleared by the [place] FDA and  has been authorized for detection and/or diagnosis of SARS-CoV-2 by FDA under an Emergency Use Authorization (EUA).  This EUA will remain in effect (meaning this test can be [place] ed) for the duration of  the COVID-19 declaration under Section 564(b)(1) of the Act, 21 U.S.C. section 360bbb-3(b)(1), unless the authorization is terminated or revoked sooner.      Influenza A by PCR NEGATIVE NEGATIVE Final   Influenza B by PCR NEGATIVE NEGATIVE Final    Comment: (NOTE) The Xpert Xpress SARS-CoV-2/FLU/RSV assay is intended as an aid in  the diagnosis of influenza from Nasopharyngeal swab specimens and  should not be used as a sole basis for treatment. Nasal washings and  aspirates are unacceptable for Xpert Xpress SARS-CoV-2/FLU/RSV   testing.  Fact Sheet for Patients: PinkCheek.be  Fact Sheet for Healthcare Providers: GravelBags.it  This test is not yet approved or cleared by the [place] FDA and  has been authorized for detection and/or diagnosis of SARS-CoV-2 by  FDA under an Emergency Use Authorization (EUA). This EUA will remain  in effect (meaning this test can be used) for the duration of the  Covid-19 declaration under Section 564(b)(1) of the Act, 21  U.S.C. section 360bbb-3(b)(1), unless the authorization is  terminated or revoked. Performed at [facility], [place] [address], [place], [place] [address]   Blood Culture (routine x 2)     Status: None (Preliminary result)   Collection Time: 01/24/20  9:56 PM   Specimen: BLOOD  Result Value Ref Range Status   Specimen Description BLOOD RIGHT ANTECUBITAL  Final   Special Requests   Final    BOTTLES DRAWN AEROBIC AND ANAEROBIC Blood Culture adequate volume   Culture   Final    NO GROWTH < 12 HOURS Performed at [facility], [place] [address] [place], [place] [address]    Report Status PENDING  Incomplete  Blood Culture (routine x 2)     Status: None (Preliminary result)   Collection Time: 01/24/20  9:59 PM   Specimen: BLOOD LEFT FOREARM  Result Value Ref Range Status   Specimen Description BLOOD LEFT FOREARM  Final   Special Requests   Final    BOTTLES DRAWN AEROBIC AND ANAEROBIC Blood Culture adequate volume   Culture   Final    NO GROWTH < 12 HOURS Performed at [facility], [place] [address] [place], [place] [address]    Report Status PENDING  Incomplete      Radiology Studies: DG Chest Port 1 View  Result Date: 01/24/2020 CLINICAL DATA:  Cough EXAM: PORTABLE CHEST 1 VIEW COMPARISON:  11/17/2007 FINDINGS: Patchy bilateral ground-glass opacity and consolidation consistent with pneumonia. No pleural effusion. Mild cardiomegaly. No pneumothorax. IMPRESSION: Patchy  bilateral ground-glass opacity and consolidation consistent with pneumonia, appearance suspicious for atypical or viral pneumonia. Electronically Signed   By: Donavan Foil M.D.   On: 01/24/2020 22:53    Marzetta Board, MD, PhD [facility]  Between 7 am - 7 pm I am available, please contact me via Amion or Securechat  Between 7 pm - 7 am I am not available, please contact  night coverage MD/APP via Amion

## 2020-01-25 NOTE — TOC Initial Note (Signed)
Transition of Care [facility]) - Initial/Assessment Note    Patient Details  Name: Toni Francis MRN: [id number] Date of Birth: Mar 10, 1956  [facility] CM/SW Contact:    Verdell Carmine, RN Phone Number: 01/25/2020, 4:53 PM  Clinical Narrative:                 Admitted for COVID pneumonia history of thyroid carcinoma. On 2LPM at present up and ambulating, Lives in [place]. May need oxygen post hospitalization. CM will follow for needs  Expected Discharge Plan: Home/Self Care Barriers to Discharge: Continued Medical Work up   Patient Goals and CMS Choice        Expected Discharge Plan and Services Expected Discharge Plan: Home/Self Care   Discharge Planning Services: CM Consult   Living arrangements for the past 2 months: Single Family Home                                      Prior Living Arrangements/Services Living arrangements for the past 2 months: Single Family Home   Patient language and need for interpreter reviewed:: Yes        Need for Family Participation in Patient Care: Yes (Comment)     Criminal Activity/Legal Involvement Pertinent to Current Situation/Hospitalization: No - Comment as needed  Activities of Daily Living      Permission Sought/Granted                  Emotional Assessment       Orientation: : Oriented to Self, Oriented to Place, Oriented to  Time, Oriented to Situation Alcohol / Substance Use: Not Applicable Psych Involvement: No (comment)  Admission diagnosis:  Pneumonia due to COVID-19 virus [U07.1, J12.82] Acute hypoxemic respiratory failure due to COVID-19 ([place]) [U07.1, J96.01] Patient Active Problem List   Diagnosis Date Noted  . Acute hypoxemic respiratory failure due to COVID-19 ([place]) 01/25/2020  . Pneumonia due to COVID-19 virus 01/24/2020  . Hypertension   . Thyroid cancer ([place]) 08/13/2019  . Thyroid mass 08/13/2019  . Allergic rhinitis 10/14/2015   PCP:  Claretta Fraise,  MD Pharmacy:   [facility] [address], [place] - [place] [address] [place] [address] Phone: [phone] Fax: [phone]  [place], [place] [address] Phone: [phone] Fax: [phone]     Social Determinants of Health (SDOH) Interventions    Readmission Risk Interventions No flowsheet data found.

## 2020-01-25 NOTE — ED Notes (Signed)
Pt ambulated in room and sitting up in chair.

## 2020-01-25 NOTE — ED Notes (Signed)
Pt's O2 noted to be consistently mid-80's on room air; pt placed on 2L via [place], O2 sats 94%

## 2020-01-26 DIAGNOSIS — J9601 Acute respiratory failure with hypoxia: Secondary | ICD-10-CM

## 2020-01-26 LAB — CBC WITH DIFFERENTIAL/PLATELET
Abs Immature Granulocytes: 0.04 10*3/uL (ref 0.00–0.07)
Basophils Absolute: 0 10*3/uL (ref 0.0–0.1)
Basophils Relative: 0 %
Eosinophils Absolute: 0 10*3/uL (ref 0.0–0.5)
Eosinophils Relative: 0 %
HCT: 40.2 % (ref 36.0–46.0)
Hemoglobin: 12.4 g/dL (ref 12.0–15.0)
Immature Granulocytes: 1 %
Lymphocytes Relative: 17 %
Lymphs Abs: 0.6 10*3/uL — ABNORMAL LOW (ref 0.7–4.0)
MCH: 27 pg (ref 26.0–34.0)
MCHC: 30.8 g/dL (ref 30.0–36.0)
MCV: 87.4 fL (ref 80.0–100.0)
Monocytes Absolute: 0.3 10*3/uL (ref 0.1–1.0)
Monocytes Relative: 8 %
Neutro Abs: 2.4 10*3/uL (ref 1.7–7.7)
Neutrophils Relative %: 74 %
Platelets: 218 10*3/uL (ref 150–400)
RBC: 4.6 MIL/uL (ref 3.87–5.11)
RDW: 14.2 % (ref 11.5–15.5)
WBC: 3.3 10*3/uL — ABNORMAL LOW (ref 4.0–10.5)
nRBC: 0 % (ref 0.0–0.2)

## 2020-01-26 LAB — COMPREHENSIVE METABOLIC PANEL
ALT: 38 U/L (ref 0–44)
AST: 51 U/L — ABNORMAL HIGH (ref 15–41)
Albumin: 2.6 g/dL — ABNORMAL LOW (ref 3.5–5.0)
Alkaline Phosphatase: 61 U/L (ref 38–126)
Anion gap: 14 (ref 5–15)
BUN: 17 mg/dL (ref 8–23)
CO2: 20 mmol/L — ABNORMAL LOW (ref 22–32)
Calcium: 8.3 mg/dL — ABNORMAL LOW (ref 8.9–10.3)
Chloride: 106 mmol/L (ref 98–111)
Creatinine, Ser: 0.77 mg/dL (ref 0.44–1.00)
GFR calc Af Amer: >60 mL/min (ref >60)
GFR calc non Af Amer: >60 mL/min (ref >60)
Glucose, Bld: 138 mg/dL — ABNORMAL HIGH (ref 70–99)
Potassium: 3.8 mmol/L (ref 3.5–5.1)
Sodium: 140 mmol/L (ref 135–145)
Total Bilirubin: 0.7 mg/dL (ref 0.3–1.2)
Total Protein: 6 g/dL — ABNORMAL LOW (ref 6.5–8.1)

## 2020-01-26 LAB — C-REACTIVE PROTEIN: CRP: 8.6 mg/dL — ABNORMAL HIGH (ref <1.0)

## 2020-01-26 LAB — FERRITIN: Ferritin: 196 ng/mL (ref 11–307)

## 2020-01-26 LAB — MAGNESIUM: Magnesium: 2 mg/dL (ref 1.7–2.4)

## 2020-01-26 LAB — PHOSPHORUS: Phosphorus: 2.8 mg/dL (ref 2.5–4.6)

## 2020-01-26 LAB — D-DIMER, QUANTITATIVE: D-Dimer, Quant: 0.57 ug/mL-FEU — ABNORMAL HIGH (ref 0.00–0.50)

## 2020-01-26 NOTE — Progress Notes (Addendum)
PROGRESS NOTE  Toni Francis [id number]:[id number] DOB: [birth year] DOA: 01/24/2020 PCP: Claretta Fraise, MD   LOS: 1 day   Brief Narrative / Interim history: 64 year old female with history of HTN, papillary thyroid carcinoma status post hemithyroidectomy, came into the hospital with generalized weakness, myalgias, cough, fevers.  She was diagnosed with COVID-19, she was febrile to 102.7, hypoxic into the 80s on room air requiring 2 L nasal cannula, chest x-ray was concerning for pneumonia and was admitted to the hospital.  She was started on remdesivir and steroids.  She was fully vaccinated  Subjective / 24h Interval events: Feeling better.  Denies any shortness of breath.  Feels weak when she ambulates.  No chest pain.  Assessment & Plan:  Principal Problem Sepsis due to, Acute Hypoxic Respiratory Failure due to Covid-19 Viral Illness -met sepsis criteria with fever, tachypnea, source -Patient hypoxic in the ED requiring 2 L nasal cannula, slightly higher at 4 L this morning.  Chest x-ray showed patchy bilateral groundglass opacities. 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  Monitor as an outpatient.  Obesity -Based on a BMI of 42, patient would benefit significantly from weight loss  Scheduled Meds: . vitamin C  500 mg Oral Daily  . enoxaparin (LOVENOX) injection  60 mg Subcutaneous QHS  . fluticasone  1 spray Each  Nare Daily  . Ipratropium-Albuterol  1 puff Inhalation Q6H  . meloxicam  15 mg Oral Daily  . methylPREDNISolone (SOLU-MEDROL) injection  0.5 mg/kg Intravenous Q12H   Followed by  . [START ON 01/28/2020] predniSONE  50 mg Oral Daily  . pantoprazole  40 mg Oral QAC supper  . triamterene-hydrochlorothiazide  1 tablet Oral Daily  . zinc sulfate  220 mg Oral Daily   Continuous Infusions: . remdesivir 100 mg in NS 100 mL 100 mg (01/26/20 0841)   PRN Meds:.acetaminophen, chlorpheniramine-HYDROcodone, guaiFENesin-dextromethorphan, ondansetron **OR** ondansetron (ZOFRAN) IV  DVT prophylaxis: Lovenox Code Status: Full code Family Communication: no family present, d/w patient   Status is: Observation  The patient will require care spanning > 2 midnights and should be moved to inpatient because: Inpatient level of care appropriate due to severity of illness  Dispo: The patient is from: Home              Anticipated d/c is to: Home              Anticipated d/c date is: 2 days              Patient currently is not medically stable to d/c.  Consultants:  None   Procedures:  None   Microbiology: None   Antibacterials: None    Objective: Vitals:   01/25/20 2000 01/25/20 2014 01/25/20 2041 01/26/20 0732  BP: 115/87  122/74 115/71  Pulse: 81  67 (!) 53  Resp:   20 16  Temp:  98 F (36.7 C) 97.8 F (36.6 C) 98 F (36.7 C)  TempSrc:  Oral Oral Oral  SpO2: 93%  97% 94%  Weight:   121.8 kg   Height:   '5\' 7"'  (1.702 m)    No intake or output data in the 24 hours ending 01/26/20 1418 Filed Weights   01/24/20 1608 01/25/20 2041  Weight: 121.2 kg 121.8 kg    Examination:  Constitutional: No distress Eyes: No icterus ENMT: mmm Neck: normal, supple Respiratory: Bibasilar rhonchi, no wheezing, no crackles, moves air well Cardiovascular: Regular rate and rhythm, no murmurs.  No peripheral edema Abdomen: Soft, nontender, nondistended, bowel sounds positive Musculoskeletal: no  clubbing / cyanosis.  Skin: No rashes seen Neurologic: Nonfocal  Data Reviewed: I have independently reviewed following labs and imaging studies   CBC: Recent Labs  Lab 01/24/20 1630 01/25/20 0500 01/26/20 0119  WBC 8.8 3.6* 3.3*  NEUTROABS 7.4 3.0 2.4  HGB 13.4 12.2 12.4  HCT 42.5 39.4 40.2  MCV 85.0 86.6 87.4  PLT 204 173 741   Basic Metabolic Panel: Recent Labs  Lab 01/24/20 1630 01/25/20 0500 01/26/20 0119  NA 140 139 140  K 3.8 3.6 3.8  CL 104 104 106  CO2 23 22 20*  GLUCOSE 94 101* 138*  BUN '14 14 17  ' CREATININE 1.04* 0.78 0.77  CALCIUM 8.4* 8.1* 8.3*  MG  --  1.8 2.0  PHOS  --  2.8 2.8   GFR: Estimated Creatinine Clearance: 97.4 mL/min (by C-G formula based on SCr of 0.77 mg/dL). Liver Function Tests: Recent Labs  Lab 01/24/20 1630 01/25/20 0500 01/26/20 0119  AST 46* 45* 51*  ALT 31 30 38  ALKPHOS 72 65 61  BILITOT 0.8 1.0 0.7  PROT 6.5 5.9* 6.0*  ALBUMIN 2.9* 2.5* 2.6*   No results for input(s): LIPASE, AMYLASE in the last 168 hours. No results for input(s): AMMONIA in the last 168 hours. Coagulation Profile: No results for input(s): INR, PROTIME in the last 168 hours. Cardiac Enzymes: No results for input(s): CKTOTAL, CKMB, CKMBINDEX, TROPONINI in the last 168 hours. BNP (last 3 results) No results for input(s): PROBNP in the last 8760 hours. HbA1C: No results for input(s): HGBA1C in the last 72 hours. CBG: No results for input(s): GLUCAP in the last 168 hours. Lipid Profile: Recent Labs    01/24/20 2130  TRIG 98   Thyroid Function Tests: Recent Labs    01/24/20 2359  TSH 5.723*   Anemia Panel: Recent Labs    01/25/20 0500 01/26/20 0119  FERRITIN 213 196   Urine analysis:    Component Value Date/Time   COLORURINE YELLOW 08/18/2007 0405   APPEARANCEUR Clear 10/21/2017 1123   LABSPEC 1.031 (H) 08/18/2007 0405   PHURINE 6.0 08/18/2007 0405   GLUCOSEU Negative 10/21/2017 1123   HGBUR TRACE (A) 08/18/2007 0405    BILIRUBINUR Negative 10/21/2017 1123   KETONESUR TRACE (A) 08/18/2007 0405   PROTEINUR Negative 10/21/2017 1123   PROTEINUR NEGATIVE 08/18/2007 0405   UROBILINOGEN 0.2 08/18/2007 0405   NITRITE Negative 10/21/2017 1123   NITRITE NEGATIVE 08/18/2007 0405   LEUKOCYTESUR 1+ (A) 10/21/2017 1123   Sepsis Labs: Invalid input(s): PROCALCITONIN, LACTICIDVEN  Recent Results (from the past 240 hour(s))  Respiratory Panel by RT PCR (Flu A&B, Covid) - Nasopharyngeal Swab     Status: Abnormal   Collection Time: 01/24/20  9:30 PM   Specimen: Nasopharyngeal Swab  Result Value Ref Range Status   SARS Coronavirus 2 by RT PCR POSITIVE (A) NEGATIVE Final    Comment: RESULT CALLED TO, READ BACK BY AND  VERIFIED WITH: Sheria Lang RN 01/25/20 AT 0006 SK (NOTE) SARS-CoV-2 target nucleic acids are DETECTED.  SARS-CoV-2 RNA is generally detectable in upper respiratory specimens  during the acute phase of infection. Positive results are indicative of the presence of the identified virus, but do not rule out bacterial infection or co-infection with other pathogens not detected by the test. Clinical correlation with patient history and other diagnostic information is necessary to determine patient infection status. The expected result is Negative.  Fact Sheet for Patients:  PinkCheek.be  Fact Sheet for Healthcare Providers: GravelBags.it  This test is not yet approved or cleared by the [place] FDA and  has been authorized for detection and/or diagnosis of SARS-CoV-2 by FDA under an Emergency Use Authorization (EUA).  This EUA will remain in effect (meaning this test can be [place] ed) for the duration of  the COVID-19 declaration under Section 564(b)(1) of the Act, 21 U.S.C. section 360bbb-3(b)(1), unless the authorization is terminated or revoked sooner.      Influenza A by PCR NEGATIVE NEGATIVE Final   Influenza B by PCR NEGATIVE NEGATIVE  Final    Comment: (NOTE) The Xpert Xpress SARS-CoV-2/FLU/RSV assay is intended as an aid in  the diagnosis of influenza from Nasopharyngeal swab specimens and  should not be used as a sole basis for treatment. Nasal washings and  aspirates are unacceptable for Xpert Xpress SARS-CoV-2/FLU/RSV  testing.  Fact Sheet for Patients: PinkCheek.be  Fact Sheet for Healthcare Providers: GravelBags.it  This test is not yet approved or cleared by the [place] FDA and  has been authorized for detection and/or diagnosis of SARS-CoV-2 by  FDA under an Emergency Use Authorization (EUA). This EUA will remain  in effect (meaning this test can be used) for the duration of the  Covid-19 declaration under Section 564(b)(1) of the Act, 21  U.S.C. section 360bbb-3(b)(1), unless the authorization is  terminated or revoked. Performed at [facility], [place] [address], [place], [place] [address]   Blood Culture (routine x 2)     Status: None (Preliminary result)   Collection Time: 01/24/20  9:56 PM   Specimen: BLOOD  Result Value Ref Range Status   Specimen Description BLOOD RIGHT ANTECUBITAL  Final   Special Requests   Final    BOTTLES DRAWN AEROBIC AND ANAEROBIC Blood Culture adequate volume   Culture   Final    NO GROWTH 2 DAYS Performed at [facility], [place] [address] [place], [place] [address]    Report Status PENDING  Incomplete  Blood Culture (routine x 2)     Status: None (Preliminary result)   Collection Time: 01/24/20  9:59 PM   Specimen: BLOOD LEFT FOREARM  Result Value Ref Range Status   Specimen Description BLOOD LEFT FOREARM  Final   Special Requests   Final    BOTTLES DRAWN AEROBIC AND ANAEROBIC Blood Culture adequate volume   Culture   Final    NO GROWTH 2 DAYS Performed at [facility], [place] [address], [place], [place] [address]    Report Status PENDING  Incomplete      Radiology Studies: DG  Chest Port 1 View  Result Date: 01/24/2020 CLINICAL DATA:  Cough EXAM: PORTABLE CHEST 1 VIEW COMPARISON:  11/17/2007 FINDINGS: Patchy bilateral ground-glass opacity and consolidation consistent with pneumonia. No pleural effusion. Mild cardiomegaly. No pneumothorax. IMPRESSION: Patchy bilateral ground-glass opacity and consolidation consistent with pneumonia, appearance suspicious for atypical or viral pneumonia. Electronically Signed   By:  Donavan Foil M.D.   On: 01/24/2020 22:53    Marzetta Board, MD, PhD [facility]  Between 7 am - 7 pm I am available, please contact me via Amion or Securechat  Between 7 pm - 7 am I am not available, please contact night coverage MD/APP via Amion

## 2020-01-27 LAB — CBC WITH DIFFERENTIAL/PLATELET
Abs Immature Granulocytes: 0.07 10*3/uL (ref 0.00–0.07)
Basophils Absolute: 0 10*3/uL (ref 0.0–0.1)
Basophils Relative: 0 %
Eosinophils Absolute: 0 10*3/uL (ref 0.0–0.5)
Eosinophils Relative: 0 %
HCT: 38.9 % (ref 36.0–46.0)
Hemoglobin: 12.3 g/dL (ref 12.0–15.0)
Immature Granulocytes: 1 %
Lymphocytes Relative: 8 %
Lymphs Abs: 0.6 10*3/uL — ABNORMAL LOW (ref 0.7–4.0)
MCH: 27.3 pg (ref 26.0–34.0)
MCHC: 31.6 g/dL (ref 30.0–36.0)
MCV: 86.3 fL (ref 80.0–100.0)
Monocytes Absolute: 0.5 10*3/uL (ref 0.1–1.0)
Monocytes Relative: 6 %
Neutro Abs: 6.1 10*3/uL (ref 1.7–7.7)
Neutrophils Relative %: 85 %
Platelets: 291 10*3/uL (ref 150–400)
RBC: 4.51 MIL/uL (ref 3.87–5.11)
RDW: 14 % (ref 11.5–15.5)
WBC: 7.2 10*3/uL (ref 4.0–10.5)
nRBC: 0 % (ref 0.0–0.2)

## 2020-01-27 LAB — COMPREHENSIVE METABOLIC PANEL WITH GFR
ALT: 51 U/L — ABNORMAL HIGH (ref 0–44)
AST: 55 U/L — ABNORMAL HIGH (ref 15–41)
Albumin: 2.5 g/dL — ABNORMAL LOW (ref 3.5–5.0)
Alkaline Phosphatase: 57 U/L (ref 38–126)
Anion gap: 10 (ref 5–15)
BUN: 23 mg/dL (ref 8–23)
CO2: 25 mmol/L (ref 22–32)
Calcium: 8.6 mg/dL — ABNORMAL LOW (ref 8.9–10.3)
Chloride: 106 mmol/L (ref 98–111)
Creatinine, Ser: 0.94 mg/dL (ref 0.44–1.00)
GFR calc Af Amer: >60 mL/min
GFR calc non Af Amer: >60 mL/min
Glucose, Bld: 141 mg/dL — ABNORMAL HIGH (ref 70–99)
Potassium: 3.9 mmol/L (ref 3.5–5.1)
Sodium: 141 mmol/L (ref 135–145)
Total Bilirubin: 0.3 mg/dL (ref 0.3–1.2)
Total Protein: 5.7 g/dL — ABNORMAL LOW (ref 6.5–8.1)

## 2020-01-27 LAB — D-DIMER, QUANTITATIVE: D-Dimer, Quant: 0.56 ug{FEU}/mL — ABNORMAL HIGH (ref 0.00–0.50)

## 2020-01-27 LAB — FERRITIN: Ferritin: 165 ng/mL (ref 11–307)

## 2020-01-27 LAB — MAGNESIUM: Magnesium: 2.3 mg/dL (ref 1.7–2.4)

## 2020-01-27 LAB — PHOSPHORUS: Phosphorus: 3.2 mg/dL (ref 2.5–4.6)

## 2020-01-27 LAB — C-REACTIVE PROTEIN: CRP: 3.2 mg/dL — ABNORMAL HIGH

## 2020-01-27 MED ORDER — SENNOSIDES-DOCUSATE SODIUM 8.6-50 MG PO TABS
1.0000 | ORAL_TABLET | Freq: Two times a day (BID) | ORAL | Status: DC
  Administered 2020-01-27 – 2020-01-28 (×3): 1 via ORAL
  Filled 2020-01-27 (×4): qty 1

## 2020-01-27 NOTE — Progress Notes (Signed)
PROGRESS NOTE  Toni Francis RSW:[id number] DOB: 15-Oct-1955 DOA: 01/24/2020 PCP: Claretta Fraise, MD   LOS: 2 days   Brief Narrative / Interim history: 64 year old female with history of HTN, papillary thyroid carcinoma status post hemithyroidectomy, came into the hospital with generalized weakness, myalgias, cough, fevers.  She was diagnosed with COVID-19, she was febrile to 102.7, hypoxic into the 80s on room air requiring 2 L nasal cannula, chest x-ray was concerning for pneumonia and was admitted to the hospital.  She was started on remdesivir and steroids.  She was fully vaccinated  Subjective / 24h Interval events: Feeling "OK", denies shortness of breath but acknowledges that her sats go into 80s with walking, she is asymptomatic  Assessment & Plan:  Principal Problem Sepsis due to, Acute Hypoxic Respiratory Failure due to Covid-19 Viral Illness -met sepsis criteria with fever, tachypnea, source -Patient hypoxic in the ED requiring 2 L nasal cannula, currently on 2L.  Chest x-ray showed patchy bilateral groundglass opacities. -Started on remdesivir, continue for 5 days, 2 days remaining  -continue IV steroids -Wean off oxygen as tolerated, supportive treatment, monitor inflammatory markers  COVID-19 Labs  Recent Labs    01/24/20 2130 01/24/20 2130 01/25/20 0500 01/26/20 0119 01/27/20 0319  DDIMER 0.82*   < > 0.80* 0.57* 0.56*  FERRITIN 236   < > 213 196 165  LDH 372*  --   --   --   --   CRP 19.0*   < > 16.6* 8.6* 3.2*   < > = values in this interval not displayed.    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  Monitor as an outpatient.  Obesity -Based on a BMI of 42, patient would benefit significantly from weight  loss  Scheduled Meds: . vitamin C  500 mg Oral Daily  . enoxaparin (LOVENOX) injection  60 mg Subcutaneous QHS  . fluticasone  1 spray Each Nare Daily  . Ipratropium-Albuterol  1 puff Inhalation Q6H  . meloxicam  15 mg Oral Daily  . methylPREDNISolone (SOLU-MEDROL) injection  0.5 mg/kg Intravenous Q12H   Followed by  . [START ON 01/28/2020] predniSONE  50 mg Oral Daily  . pantoprazole  40 mg Oral QAC supper  . triamterene-hydrochlorothiazide  1 tablet Oral Daily  . zinc sulfate  220 mg Oral Daily   Continuous Infusions: . remdesivir 100 mg in NS 100 mL 100 mg (01/27/20 0912)   PRN Meds:.acetaminophen, chlorpheniramine-HYDROcodone, guaiFENesin-dextromethorphan, ondansetron **OR** ondansetron (ZOFRAN) IV  DVT prophylaxis: Lovenox Code Status: Full code Family Communication: no family present, d/w patient   Status is: Observation  The patient will require care spanning > 2 midnights and should be moved to inpatient because: Inpatient level of care appropriate due to severity of illness  Dispo: The patient is from: Home              Anticipated d/c is to: Home              Anticipated d/c date is: 2 days              Patient currently is not medically stable to d/c.  Consultants:  None   Procedures:  None   Microbiology: None   Antibacterials: None    Objective: Vitals:   01/26/20 1200 01/26/20 1625 01/26/20 2130  01/26/20 2229  BP:  128/77 (!) 99/51 121/66  Pulse:  72 64   Resp:  16 17   Temp:   98.2 F (36.8 C)   TempSrc:   Oral   SpO2: 92% 94% 91%   Weight:      Height:        Intake/Output Summary (Last 24 hours) at 01/27/2020 1242 Last data filed at 01/27/2020 0418 Gross per 24 hour  Intake 7.75 ml  Output --  Net 7.75 ml   Filed Weights   01/24/20 1608 01/25/20 2041  Weight: 121.2 kg 121.8 kg    Examination:  Constitutional: nad Eyes: no icterus  ENMT: mmm Neck: normal, supple Respiratory: bibasilar ronchi, no wheezing, no  crackles Cardiovascular: rrr, no mrg, no edema Abdomen: soft, nt, nd, bs+ Musculoskeletal: no clubbing / cyanosis.  Skin: no new rashes Neurologic: non focal   Data Reviewed: I have independently reviewed following labs and imaging studies   CBC: Recent Labs  Lab 01/24/20 1630 01/25/20 0500 01/26/20 0119 01/27/20 0319  WBC 8.8 3.6* 3.3* 7.2  NEUTROABS 7.4 3.0 2.4 6.1  HGB 13.4 12.2 12.4 12.3  HCT 42.5 39.4 40.2 38.9  MCV 85.0 86.6 87.4 86.3  PLT 204 173 218 997   Basic Metabolic Panel: Recent Labs  Lab 01/24/20 1630 01/25/20 0500 01/26/20 0119 01/27/20 0319  NA 140 139 140 141  K 3.8 3.6 3.8 3.9  CL 104 104 106 106  CO2 23 22 20* 25  GLUCOSE 94 101* 138* 141*  BUN _0 CREATININE 1.04* 0.78 0.77 0.94  CALCIUM 8.4* 8.1* 8.3* 8.6*  MG  --  1.8 2.0 2.3  PHOS  --  2.8 2.8 3.2   GFR: Estimated Creatinine Clearance: 82.9 mL/min (by C-G formula based on SCr of 0.94 mg/dL). Liver Function Tests: Recent Labs  Lab 01/24/20 1630 01/25/20 0500 01/26/20 0119 01/27/20 0319  AST 46* 45* 51* 55*  ALT 31 30 38 51*  ALKPHOS 72 65 61 57  BILITOT 0.8 1.0 0.7 0.3  PROT 6.5 5.9* 6.0* 5.7*  ALBUMIN 2.9* 2.5* 2.6* 2.5*   No results for input(s): LIPASE, AMYLASE in the last 168 hours. No results for input(s): AMMONIA in the last 168 hours. Coagulation Profile: No results for input(s): INR, PROTIME in the last 168 hours. Cardiac Enzymes: No results for input(s): CKTOTAL, CKMB, CKMBINDEX, TROPONINI in the last 168 hours. BNP (last 3 results) No results for input(s): PROBNP in the last 8760 hours. HbA1C: No results for input(s): HGBA1C in the last 72 hours. CBG: No results for input(s): GLUCAP in the last 168 hours. Lipid Profile: Recent Labs    01/24/20 2130  TRIG 98   Thyroid Function Tests: Recent Labs    01/24/20 2359  TSH 5.723*   Anemia Panel: Recent Labs    01/26/20 0119 01/27/20 0319  FERRITIN 196 165   Urine analysis:    Component Value  Date/Time   COLORURINE YELLOW 08/18/2007 0405   APPEARANCEUR Clear 10/21/2017 1123   LABSPEC 1.031 (H) 08/18/2007 0405   PHURINE 6.0 08/18/2007 0405   GLUCOSEU Negative 10/21/2017 1123   HGBUR TRACE (A) 08/18/2007 0405   BILIRUBINUR Negative 10/21/2017 1123   KETONESUR TRACE (A) 08/18/2007 0405   PROTEINUR Negative 10/21/2017 1123   PROTEINUR NEGATIVE 08/18/2007 0405   UROBILINOGEN 0.2 08/18/2007 0405   NITRITE Negative 10/21/2017 1123   NITRITE NEGATIVE 08/18/2007 0405   LEUKOCYTESUR 1+ (A) 10/21/2017 1123   Sepsis Labs: Invalid input(s): PROCALCITONIN,  LACTICIDVEN  Recent Results (from the past 240 hour(s))  Respiratory Panel by RT PCR (Flu A&B, Covid) - Nasopharyngeal Swab     Status: Abnormal   Collection Time: 01/24/20  9:30 PM   Specimen: Nasopharyngeal Swab  Result Value Ref Range Status   SARS Coronavirus 2 by RT PCR POSITIVE (A) NEGATIVE Final    Comment: RESULT CALLED TO, READ BACK BY AND VERIFIED WITH: T [facility] RN 01/25/20 AT 0006 SK (NOTE) SARS-CoV-2 target nucleic acids are DETECTED.  SARS-CoV-2 RNA is generally detectable in upper respiratory specimens  during the acute phase of infection. Positive results are indicative of the presence of the identified virus, but do not rule out bacterial infection or co-infection with other pathogens not detected by the test. Clinical correlation with patient history and other diagnostic information is necessary to determine patient infection status. The expected result is Negative.  Fact Sheet for Patients:  PinkCheek.be  Fact Sheet for Healthcare Providers: GravelBags.it  This test is not yet approved or cleared by the [place] FDA and  has been authorized for detection and/or diagnosis of SARS-CoV-2 by FDA under an Emergency Use Authorization (EUA).  This EUA will remain in effect (meaning this test can be [place] ed) for the duration of  the COVID-19  declaration under Section 564(b)(1) of the Act, 21 U.S.C. section 360bbb-3(b)(1), unless the authorization is terminated or revoked sooner.      Influenza A by PCR NEGATIVE NEGATIVE Final   Influenza B by PCR NEGATIVE NEGATIVE Final    Comment: (NOTE) The Xpert Xpress SARS-CoV-2/FLU/RSV assay is intended as an aid in  the diagnosis of influenza from Nasopharyngeal swab specimens and  should not be used as a sole basis for treatment. Nasal washings and  aspirates are unacceptable for Xpert Xpress SARS-CoV-2/FLU/RSV  testing.  Fact Sheet for Patients: PinkCheek.be  Fact Sheet for Healthcare Providers: GravelBags.it  This test is not yet approved or cleared by the [place] FDA and  has been authorized for detection and/or diagnosis of SARS-CoV-2 by  FDA under an Emergency Use Authorization (EUA). This EUA will remain  in effect (meaning this test can be used) for the duration of the  Covid-19 declaration under Section 564(b)(1) of the Act, 21  U.S.C. section 360bbb-3(b)(1), unless the authorization is  terminated or revoked. Performed at [facility], [place] [address] [place], [place] [address]   Blood Culture (routine x 2)     Status: None (Preliminary result)   Collection Time: 01/24/20  9:56 PM   Specimen: BLOOD  Result Value Ref Range Status   Specimen Description BLOOD RIGHT ANTECUBITAL  Final   Special Requests   Final    BOTTLES DRAWN AEROBIC AND ANAEROBIC Blood Culture adequate volume   Culture   Final    NO GROWTH 3 DAYS Performed at [facility], [place] [address] [place], [place] [address]    Report Status PENDING  Incomplete  Blood Culture (routine x 2)     Status: None (Preliminary result)   Collection Time: 01/24/20  9:59 PM   Specimen: BLOOD LEFT FOREARM  Result Value Ref Range Status   Specimen Description BLOOD LEFT FOREARM  Final   Special Requests   Final    BOTTLES DRAWN AEROBIC  AND ANAEROBIC Blood Culture adequate volume   Culture   Final    NO GROWTH 3 DAYS Performed at [facility], [place] [address] [place], [place] [address]    Report Status PENDING  Incomplete      Radiology Studies: No results found.  Marzetta Board, MD, PhD [facility]  Between 7 am - 7 pm I am available, please contact me via Amion or Securechat  Between 7 pm - 7 am I am not available, please contact night coverage MD/APP via Amion

## 2020-01-28 LAB — CBC WITH DIFFERENTIAL/PLATELET
Abs Immature Granulocytes: 0.18 10*3/uL — ABNORMAL HIGH (ref 0.00–0.07)
Basophils Absolute: 0 10*3/uL (ref 0.0–0.1)
Basophils Relative: 0 %
Eosinophils Absolute: 0 10*3/uL (ref 0.0–0.5)
Eosinophils Relative: 0 %
HCT: 39.9 % (ref 36.0–46.0)
Hemoglobin: 12.5 g/dL (ref 12.0–15.0)
Immature Granulocytes: 2 %
Lymphocytes Relative: 8 %
Lymphs Abs: 0.7 10*3/uL (ref 0.7–4.0)
MCH: 26.8 pg (ref 26.0–34.0)
MCHC: 31.3 g/dL (ref 30.0–36.0)
MCV: 85.4 fL (ref 80.0–100.0)
Monocytes Absolute: 1 10*3/uL (ref 0.1–1.0)
Monocytes Relative: 12 %
Neutro Abs: 6.8 10*3/uL (ref 1.7–7.7)
Neutrophils Relative %: 78 %
Platelets: 306 10*3/uL (ref 150–400)
RBC: 4.67 MIL/uL (ref 3.87–5.11)
RDW: 13.9 % (ref 11.5–15.5)
WBC: 8.7 10*3/uL (ref 4.0–10.5)
nRBC: 0 % (ref 0.0–0.2)

## 2020-01-28 LAB — C-REACTIVE PROTEIN: CRP: 1.5 mg/dL — ABNORMAL HIGH (ref <1.0)

## 2020-01-28 LAB — D-DIMER, QUANTITATIVE: D-Dimer, Quant: 0.57 ug/mL-FEU — ABNORMAL HIGH (ref 0.00–0.50)

## 2020-01-28 LAB — COMPREHENSIVE METABOLIC PANEL
ALT: 106 U/L — ABNORMAL HIGH (ref 0–44)
AST: 91 U/L — ABNORMAL HIGH (ref 15–41)
Albumin: 2.5 g/dL — ABNORMAL LOW (ref 3.5–5.0)
Alkaline Phosphatase: 55 U/L (ref 38–126)
Anion gap: 10 (ref 5–15)
BUN: 27 mg/dL — ABNORMAL HIGH (ref 8–23)
CO2: 22 mmol/L (ref 22–32)
Calcium: 8.6 mg/dL — ABNORMAL LOW (ref 8.9–10.3)
Chloride: 108 mmol/L (ref 98–111)
Creatinine, Ser: 1.09 mg/dL — ABNORMAL HIGH (ref 0.44–1.00)
GFR calc Af Amer: >60 mL/min (ref >60)
GFR calc non Af Amer: 54 mL/min — ABNORMAL LOW (ref >60)
Glucose, Bld: 112 mg/dL — ABNORMAL HIGH (ref 70–99)
Potassium: 4.1 mmol/L (ref 3.5–5.1)
Sodium: 140 mmol/L (ref 135–145)
Total Bilirubin: 0.4 mg/dL (ref 0.3–1.2)
Total Protein: 5.6 g/dL — ABNORMAL LOW (ref 6.5–8.1)

## 2020-01-28 LAB — PHOSPHORUS: Phosphorus: 4.1 mg/dL (ref 2.5–4.6)

## 2020-01-28 LAB — MAGNESIUM: Magnesium: 2.3 mg/dL (ref 1.7–2.4)

## 2020-01-28 LAB — FERRITIN: Ferritin: 197 ng/mL (ref 11–307)

## 2020-01-28 NOTE — Progress Notes (Signed)
PROGRESS NOTE  Toni Francis JJO:[id number] DOB: 04-06-1956 DOA: 01/24/2020 PCP: Claretta Fraise, MD   LOS: 3 days   Brief Narrative / Interim history: 64 year old female with history of HTN, papillary thyroid carcinoma status post hemithyroidectomy, came into the hospital with generalized weakness, myalgias, cough, fevers.  She was diagnosed with COVID-19, she was febrile to 102.7, hypoxic into the 80s on room air requiring 2 L nasal cannula, chest x-ray was concerning for pneumonia and was admitted to the hospital.  She was started on remdesivir and steroids.  She was fully vaccinated  Subjective / 24h Interval events: Improving, feeling stronger.  She was able to ambulate 3 times yesterday and felt better each time.  Still on oxygen this morning  Assessment & Plan:  Principal Problem Sepsis due to, Acute Hypoxic Respiratory Failure due to Covid-19 Viral Illness -met sepsis criteria with fever, tachypnea, source -Patient hypoxic in the ED requiring 2 L nasal cannula, currently remains on 2 L, wean off as tolerated.  Chest x-ray showed patchy bilateral groundglass opacities. 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  Monitor as an outpatient.  Obesity -Based on a BMI of 42, patient would benefit significantly from weight loss  Scheduled Meds: . vitamin C  500 mg Oral Daily  . enoxaparin (LOVENOX) injection  60 mg Subcutaneous QHS  .  fluticasone  1 spray Each Nare Daily  . Ipratropium-Albuterol  1 puff Inhalation Q6H  . meloxicam  15 mg Oral Daily  . pantoprazole  40 mg Oral QAC supper  . predniSONE  50 mg Oral Daily  . senna-docusate  1 tablet Oral BID  . triamterene-hydrochlorothiazide  1 tablet Oral Daily  . zinc sulfate  220 mg Oral Daily   Continuous Infusions: . remdesivir 100 mg in NS 100 mL 100 mg (01/28/20 0949)   PRN Meds:.acetaminophen, chlorpheniramine-HYDROcodone, guaiFENesin-dextromethorphan, ondansetron **OR** ondansetron (ZOFRAN) IV  DVT prophylaxis: Lovenox Code Status: Full code Family Communication: no family present, d/w patient   Status is: Observation  The patient will require care spanning > 2 midnights and should be moved to inpatient because: Inpatient level of care appropriate due to severity of illness  Dispo: The patient is from: Home              Anticipated d/c is to: Home              Anticipated d/c date is: 1 day              Patient currently is not medically stable to d/c.  Consultants:  None   Procedures:  None   Microbiology: None   Antibacterials: None    Objective: Vitals:   01/27/20 1620 01/27/20 2030 01/28/20 0800 01/28/20 0809  BP: 115/66 131/70  118/76  Pulse: 64 60  61  Resp: _0 Temp:  97.6 F (36.4 C)  98.1 F (36.7 C)  TempSrc:      SpO2: 93% 97%  95%  Weight:  Height:       No intake or output data in the 24 hours ending 01/28/20 1121 Filed Weights   01/24/20 1608 01/25/20 2041  Weight: 121.2 kg 121.8 kg    Examination:  Constitutional: No distress Eyes: No scleral icterus ENMT: Moist mucous membranes Neck: normal, supple Respiratory: Bibasilar rhonchi, no wheezing, no crackles Cardiovascular: Regular rate and rhythm, no murmurs, no peripheral edema Abdomen: Nondistended, bowel sounds positive Musculoskeletal: no clubbing / cyanosis.  Skin: No rashes appreciated Neurologic: Nonfocal  Data Reviewed: I have  independently reviewed following labs and imaging studies   CBC: Recent Labs  Lab 01/24/20 1630 01/25/20 0500 01/26/20 0119 01/27/20 0319 01/28/20 0413  WBC 8.8 3.6* 3.3* 7.2 8.7  NEUTROABS 7.4 3.0 2.4 6.1 6.8  HGB 13.4 12.2 12.4 12.3 12.5  HCT 42.5 39.4 40.2 38.9 39.9  MCV 85.0 86.6 87.4 86.3 85.4  PLT 204 173 218 291 161   Basic Metabolic Panel: Recent Labs  Lab 01/24/20 1630 01/25/20 0500 01/26/20 0119 01/27/20 0319 01/28/20 0413  NA 140 139 140 141 140  K 3.8 3.6 3.8 3.9 4.1  CL 104 104 106 106 108  CO2 23 22 20* 25 22  GLUCOSE 94 101* 138* 141* 112*  BUN _0 27*  CREATININE 1.04* 0.78 0.77 0.94 1.09*  CALCIUM 8.4* 8.1* 8.3* 8.6* 8.6*  MG  --  1.8 2.0 2.3 2.3  PHOS  --  2.8 2.8 3.2 4.1   GFR: Estimated Creatinine Clearance: 71.5 mL/min (A) (by C-G formula based on SCr of 1.09 mg/dL (H)). Liver Function Tests: Recent Labs  Lab 01/24/20 1630 01/25/20 0500 01/26/20 0119 01/27/20 0319 01/28/20 0413  AST 46* 45* 51* 55* 91*  ALT 31 30 38 51* 106*  ALKPHOS 72 65 61 57 55  BILITOT 0.8 1.0 0.7 0.3 0.4  PROT 6.5 5.9* 6.0* 5.7* 5.6*  ALBUMIN 2.9* 2.5* 2.6* 2.5* 2.5*   No results for input(s): LIPASE, AMYLASE in the last 168 hours. No results for input(s): AMMONIA in the last 168 hours. Coagulation Profile: No results for input(s): INR, PROTIME in the last 168 hours. Cardiac Enzymes: No results for input(s): CKTOTAL, CKMB, CKMBINDEX, TROPONINI in the last 168 hours. BNP (last 3 results) No results for input(s): PROBNP in the last 8760 hours. HbA1C: No results for input(s): HGBA1C in the last 72 hours. CBG: No results for input(s): GLUCAP in the last 168 hours. Lipid Profile: No results for input(s): CHOL, HDL, LDLCALC, TRIG, CHOLHDL, LDLDIRECT in the last 72 hours. Thyroid Function Tests: No results for input(s): TSH, T4TOTAL, FREET4, T3FREE, THYROIDAB in the last 72 hours. Anemia Panel: Recent Labs    01/27/20 0319 01/28/20 0413  FERRITIN  165 197   Urine analysis:    Component Value Date/Time   COLORURINE YELLOW 08/18/2007 0405   APPEARANCEUR Clear 10/21/2017 1123   LABSPEC 1.031 (H) 08/18/2007 0405   PHURINE 6.0 08/18/2007 0405   GLUCOSEU Negative 10/21/2017 1123   HGBUR TRACE (A) 08/18/2007 0405   BILIRUBINUR Negative 10/21/2017 1123   KETONESUR TRACE (A) 08/18/2007 0405   PROTEINUR Negative 10/21/2017 1123   PROTEINUR NEGATIVE 08/18/2007 0405   UROBILINOGEN 0.2 08/18/2007 0405   NITRITE Negative 10/21/2017 1123   NITRITE NEGATIVE 08/18/2007 0405   LEUKOCYTESUR 1+ (A) 10/21/2017 1123   Sepsis Labs: Invalid input(s): PROCALCITONIN, LACTICIDVEN  Recent Results (from the past 240 hour(s))  Respiratory Panel by RT PCR (Flu A&B, Covid) - Nasopharyngeal Swab     Status: Abnormal  Collection Time: 01/24/20  9:30 PM   Specimen: Nasopharyngeal Swab  Result Value Ref Range Status   SARS Coronavirus 2 by RT PCR POSITIVE (A) NEGATIVE Final    Comment: RESULT CALLED TO, READ BACK BY AND VERIFIED WITH: T [facility] RN 01/25/20 AT 0006 SK (NOTE) SARS-CoV-2 target nucleic acids are DETECTED.  SARS-CoV-2 RNA is generally detectable in upper respiratory specimens  during the acute phase of infection. Positive results are indicative of the presence of the identified virus, but do not rule out bacterial infection or co-infection with other pathogens not detected by the test. Clinical correlation with patient history and other diagnostic information is necessary to determine patient infection status. The expected result is Negative.  Fact Sheet for Patients:  PinkCheek.be  Fact Sheet for Healthcare Providers: GravelBags.it  This test is not yet approved or cleared by the [place] FDA and  has been authorized for detection and/or diagnosis of SARS-CoV-2 by FDA under an Emergency Use Authorization (EUA).  This EUA will remain in effect (meaning this test can be  [place] ed) for the duration of  the COVID-19 declaration under Section 564(b)(1) of the Act, 21 U.S.C. section 360bbb-3(b)(1), unless the authorization is terminated or revoked sooner.      Influenza A by PCR NEGATIVE NEGATIVE Final   Influenza B by PCR NEGATIVE NEGATIVE Final    Comment: (NOTE) The Xpert Xpress SARS-CoV-2/FLU/RSV assay is intended as an aid in  the diagnosis of influenza from Nasopharyngeal swab specimens and  should not be used as a sole basis for treatment. Nasal washings and  aspirates are unacceptable for Xpert Xpress SARS-CoV-2/FLU/RSV  testing.  Fact Sheet for Patients: PinkCheek.be  Fact Sheet for Healthcare Providers: GravelBags.it  This test is not yet approved or cleared by the [place] FDA and  has been authorized for detection and/or diagnosis of SARS-CoV-2 by  FDA under an Emergency Use Authorization (EUA). This EUA will remain  in effect (meaning this test can be used) for the duration of the  Covid-19 declaration under Section 564(b)(1) of the Act, 21  U.S.C. section 360bbb-3(b)(1), unless the authorization is  terminated or revoked. Performed at [facility], [place] [address], [place], [place] [address]   Blood Culture (routine x 2)     Status: None (Preliminary result)   Collection Time: 01/24/20  9:56 PM   Specimen: BLOOD  Result Value Ref Range Status   Specimen Description BLOOD RIGHT ANTECUBITAL  Final   Special Requests   Final    BOTTLES DRAWN AEROBIC AND ANAEROBIC Blood Culture adequate volume   Culture   Final    NO GROWTH 4 DAYS Performed at [facility], [place] [address] [place], [place] [address]    Report Status PENDING  Incomplete  Blood Culture (routine x 2)     Status: None (Preliminary result)   Collection Time: 01/24/20  9:59 PM   Specimen: BLOOD LEFT FOREARM  Result Value Ref Range Status   Specimen Description BLOOD LEFT FOREARM  Final   Special  Requests   Final    BOTTLES DRAWN AEROBIC AND ANAEROBIC Blood Culture adequate volume   Culture   Final    NO GROWTH 4 DAYS Performed at [facility], [place] [address], [place], [place] [address]    Report Status PENDING  Incomplete      Radiology Studies: No results found.  Marzetta Board, MD, PhD [facility]  Between 7 am - 7 pm I am available,  please contact me via Amion or Securechat  Between 7 pm - 7 am I am not available, please contact night coverage MD/APP via Amion

## 2020-01-29 LAB — PHOSPHORUS: Phosphorus: 3.8 mg/dL (ref 2.5–4.6)

## 2020-01-29 LAB — CULTURE, BLOOD (ROUTINE X 2)
Culture: NO GROWTH
Culture: NO GROWTH
Special Requests: ADEQUATE
Special Requests: ADEQUATE

## 2020-01-29 LAB — CBC WITH DIFFERENTIAL/PLATELET
Abs Immature Granulocytes: 0.31 10*3/uL — ABNORMAL HIGH (ref 0.00–0.07)
Basophils Absolute: 0.1 10*3/uL (ref 0.0–0.1)
Basophils Relative: 1 %
Eosinophils Absolute: 0 10*3/uL (ref 0.0–0.5)
Eosinophils Relative: 0 %
HCT: 41.3 % (ref 36.0–46.0)
Hemoglobin: 13.4 g/dL (ref 12.0–15.0)
Immature Granulocytes: 4 %
Lymphocytes Relative: 12 %
Lymphs Abs: 0.9 10*3/uL (ref 0.7–4.0)
MCH: 27.7 pg (ref 26.0–34.0)
MCHC: 32.4 g/dL (ref 30.0–36.0)
MCV: 85.5 fL (ref 80.0–100.0)
Monocytes Absolute: 0.7 10*3/uL (ref 0.1–1.0)
Monocytes Relative: 11 %
Neutro Abs: 5.1 10*3/uL (ref 1.7–7.7)
Neutrophils Relative %: 72 %
Platelets: 316 10*3/uL (ref 150–400)
RBC: 4.83 MIL/uL (ref 3.87–5.11)
RDW: 13.9 % (ref 11.5–15.5)
WBC: 7.1 10*3/uL (ref 4.0–10.5)
nRBC: 0 % (ref 0.0–0.2)

## 2020-01-29 LAB — COMPREHENSIVE METABOLIC PANEL
ALT: 91 U/L — ABNORMAL HIGH (ref 0–44)
AST: 48 U/L — ABNORMAL HIGH (ref 15–41)
Albumin: 2.8 g/dL — ABNORMAL LOW (ref 3.5–5.0)
Alkaline Phosphatase: 60 U/L (ref 38–126)
Anion gap: 13 (ref 5–15)
BUN: 25 mg/dL — ABNORMAL HIGH (ref 8–23)
CO2: 19 mmol/L — ABNORMAL LOW (ref 22–32)
Calcium: 8.8 mg/dL — ABNORMAL LOW (ref 8.9–10.3)
Chloride: 107 mmol/L (ref 98–111)
Creatinine, Ser: 0.96 mg/dL (ref 0.44–1.00)
GFR calc Af Amer: >60 mL/min (ref >60)
GFR calc non Af Amer: >60 mL/min (ref >60)
Glucose, Bld: 92 mg/dL (ref 70–99)
Potassium: 3.9 mmol/L (ref 3.5–5.1)
Sodium: 139 mmol/L (ref 135–145)
Total Bilirubin: 0.7 mg/dL (ref 0.3–1.2)
Total Protein: 5.7 g/dL — ABNORMAL LOW (ref 6.5–8.1)

## 2020-01-29 LAB — D-DIMER, QUANTITATIVE: D-Dimer, Quant: 0.52 ug/mL-FEU — ABNORMAL HIGH (ref 0.00–0.50)

## 2020-01-29 LAB — MAGNESIUM: Magnesium: 2.2 mg/dL (ref 1.7–2.4)

## 2020-01-29 LAB — FERRITIN: Ferritin: 161 ng/mL (ref 11–307)

## 2020-01-29 LAB — C-REACTIVE PROTEIN: CRP: 1.1 mg/dL — ABNORMAL HIGH (ref <1.0)

## 2020-01-29 MED ORDER — DEXAMETHASONE 6 MG PO TABS: 6.0000 mg | ORAL_TABLET | Freq: Every day | ORAL | 0 refills | Status: AC

## 2020-01-29 MED ORDER — GUAIFENESIN-DM 100-10 MG/5ML PO SYRP: 10.0000 mL | ORAL_SOLUTION | ORAL | 0 refills | Status: DC | PRN

## 2020-01-29 NOTE — Plan of Care (Signed)

## 2020-01-29 NOTE — Discharge Summary (Addendum)
Physician Discharge Summary  [name] MEB:[id number] DOB: [birth year] DOA: 01/24/2020  PCP: Claretta Fraise, MD  Admit date: 01/24/2020 Discharge date: 01/29/2020  Admitted From: home Disposition:  home  Recommendations for Outpatient Follow-up:  1. Follow up with PCP in 1-2 weeks  Home Health: none Equipment/Devices: none  Discharge Condition: stable CODE STATUS: Full code Diet recommendation: regular  HPI: Per admitting MD, Toni Francis is a 64 y.o. female with medical history significant for hypertension, GERD, right papillary thyroid carcinoma s/p right hemithyroidectomy who presents to the ED for evaluation of generalized weakness, myalgias, cough, fevers in setting of recent positive COVID-19 testing. Patient has been fully vaccinated against COVID-19 and received second dose of the Pfizer vaccine 07/14/2019.  Patient states she tested positive for COVID-19 on 9/24 at an outside testing center.  She has been having generalized weakness, fevers, chills, body aches, and cough occasionally productive of yellow sputum.  She denies having significant shortness of breath.  Her symptoms have been progressive over the last week.  She has had poor appetite and not eating well.  She says she has not been able to take medications due to nausea and vomiting.  She has had some diarrhea.  She says she is becoming easily fatigued.  Of note, she says her father is currently in the hospital for Covid pneumonia.  Hospital Course / Discharge diagnoses: Principal Problem Sepsis due to, Acute Hypoxic Respiratory Failure due to Covid-19 Viral Illness -met sepsis criteria with fever, tachypnea, source. Patient hypoxic in the ED requiring 2 L nasal cannula,  chest x-ray on admission showed patchy bilateral groundglass opacities.  She completed 5 days of Remdesivir and steroids while hospitalized, she improved, she was able to be weaned off to room air, she will be discharged home in stable condition with 5  additional days of steroids to complete a 10-day course.  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  Monitor as an outpatient. Obesity -Based on a BMI of 42, patient would benefit significantly from weight loss  Discharge Instructions   Allergies as of 01/29/2020      Reactions   Ampicillin Rash   Did it involve swelling of the face/tongue/throat, SOB, or low BP? No Did it involve sudden or severe rash/hives, skin peeling, or any reaction on the inside of your mouth or nose? Yes Did you need to seek medical attention at a hospital or doctor's office? Yes When did it last happen?10 + years If all above answers are "NO", may proceed with cephalosporin use.   Codeine Nausea And Vomiting, Other (See Comments)   Also causes headaches   Pseudoephedrine Palpitations      Medication List    STOP taking these medications   DULoxetine 30 MG capsule Commonly known as: CYMBALTA   moxifloxacin 400 MG tablet Commonly known as: Avelox   predniSONE 10 MG tablet Commonly known as: DELTASONE     TAKE these medications   acetaminophen 500 MG tablet Commonly known as: TYLENOL Take 1,000 mg by mouth daily as needed for moderate pain or headache.   bacitracin ointment Apply 1 application topically every 8 (eight) hours.   CALCIUM + D PO Take 1 tablet by mouth daily.   EYE VITAMINS PO Take 1 capsule by mouth daily.   CENTRUM SILVER PO Take 1 tablet by mouth daily.   dexamethasone 6 MG tablet Commonly known as: DECADRON Take 1 tablet (6 mg  total) by mouth daily for 5 days.   FISH OIL PO Take 1 capsule by mouth daily.   fluticasone 50 MCG/ACT nasal spray Commonly known as: FLONASE USE 2 SPRAY(S) IN EACH NOSTRIL TWICE DAILY   guaiFENesin-dextromethorphan 100-10 MG/5ML syrup Commonly known as: ROBITUSSIN DM Take 10 mLs by mouth every 4 (four) hours as needed for  cough.   levocetirizine 5 MG tablet Commonly known as: XYZAL TAKE 1 TABLET BY MOUTH IN THE EVENING FOR ALLERGIES What changed: See the new instructions.   meloxicam 15 MG tablet Commonly known as: MOBIC Take 1 tablet by mouth once daily   omeprazole 20 MG capsule Commonly known as: PRILOSEC Take 20 mg by mouth every evening.   triamterene-hydrochlorothiazide 37.5-25 MG tablet Commonly known as: MAXZIDE-25 Take 1 tablet by mouth daily. For blood pressure and fluid       Follow-up Information    Claretta Fraise, MD. Schedule an appointment as soon as possible for a visit in 2 week(s).   Specialty: Family Medicine Contact information: [place] [address] [phone]               Consultations:  None   Procedures/Studies:  DG Chest Port 1 View  Result Date: 01/24/2020 CLINICAL DATA:  Cough EXAM: PORTABLE CHEST 1 VIEW COMPARISON:  11/17/2007 FINDINGS: Patchy bilateral ground-glass opacity and consolidation consistent with pneumonia. No pleural effusion. Mild cardiomegaly. No pneumothorax. IMPRESSION: Patchy bilateral ground-glass opacity and consolidation consistent with pneumonia, appearance suspicious for atypical or viral pneumonia. Electronically Signed   By: Donavan Foil M.D.   On: 01/24/2020 22:53    Subjective: - no chest pain, shortness of breath, no abdominal pain, nausea or vomiting.   Discharge Exam: BP 108/75 (BP Location: Left Arm)   Pulse 60   Temp 98 F (36.7 C)   Resp 14   Ht '5\' 7"'  (1.702 m)   Wt 121.8 kg   SpO2 94%   BMI 42.06 kg/m   General: Pt is alert, awake, not in acute distress Cardiovascular: RRR, S1/S2 +, no rubs, no gallops Respiratory: CTA bilaterally, no wheezing, no rhonchi Abdominal: Soft, NT, ND, bowel sounds + Extremities: no edema, no cyanosis    The results of significant diagnostics from this hospitalization (including imaging, microbiology, ancillary and laboratory) are listed below for reference.       Microbiology: Recent Results (from the past 240 hour(s))  Respiratory Panel by RT PCR (Flu A&B, Covid) - Nasopharyngeal Swab     Status: Abnormal   Collection Time: 01/24/20  9:30 PM   Specimen: Nasopharyngeal Swab  Result Value Ref Range Status   SARS Coronavirus 2 by RT PCR POSITIVE (A) NEGATIVE Final    Comment: RESULT CALLED TO, READ BACK BY AND VERIFIED WITH: T [facility] RN 01/25/20 AT 0006 SK (NOTE) SARS-CoV-2 target nucleic acids are DETECTED.  SARS-CoV-2 RNA is generally detectable in upper respiratory specimens  during the acute phase of infection. Positive results are indicative of the presence of the identified virus, but do not rule out bacterial infection or co-infection with other pathogens not detected by the test. Clinical correlation with patient history and other diagnostic information is necessary to determine patient infection status. The expected result is Negative.  Fact Sheet for Patients:  PinkCheek.be  Fact Sheet for Healthcare Providers: GravelBags.it  This test is not yet approved or cleared by the [place] FDA and  has been authorized for detection and/or diagnosis of SARS-CoV-2 by FDA under an Emergency Use Authorization (  EUA).  This EUA will remain in effect (meaning this test can be [place] ed) for the duration of  the COVID-19 declaration under Section 564(b)(1) of the Act, 21 U.S.C. section 360bbb-3(b)(1), unless the authorization is terminated or revoked sooner.      Influenza A by PCR NEGATIVE NEGATIVE Final   Influenza B by PCR NEGATIVE NEGATIVE Final    Comment: (NOTE) The Xpert Xpress SARS-CoV-2/FLU/RSV assay is intended as an aid in  the diagnosis of influenza from Nasopharyngeal swab specimens and  should not be used as a sole basis for treatment. Nasal washings and  aspirates are unacceptable for Xpert Xpress SARS-CoV-2/FLU/RSV  testing.  Fact Sheet for  Patients: PinkCheek.be  Fact Sheet for Healthcare Providers: GravelBags.it  This test is not yet approved or cleared by the [place] FDA and  has been authorized for detection and/or diagnosis of SARS-CoV-2 by  FDA under an Emergency Use Authorization (EUA). This EUA will remain  in effect (meaning this test can be used) for the duration of the  Covid-19 declaration under Section 564(b)(1) of the Act, 21  U.S.C. section 360bbb-3(b)(1), unless the authorization is  terminated or revoked. Performed at [facility], [place] [address] [place], [place] [address]   Blood Culture (routine x 2)     Status: None   Collection Time: 01/24/20  9:56 PM   Specimen: BLOOD  Result Value Ref Range Status   Specimen Description BLOOD RIGHT ANTECUBITAL  Final   Special Requests   Final    BOTTLES DRAWN AEROBIC AND ANAEROBIC Blood Culture adequate volume   Culture   Final    NO GROWTH 5 DAYS Performed at [facility], [place] [address] [place], [place] [address]    Report Status 01/29/2020 FINAL  Final  Blood Culture (routine x 2)     Status: None   Collection Time: 01/24/20  9:59 PM   Specimen: BLOOD LEFT FOREARM  Result Value Ref Range Status   Specimen Description BLOOD LEFT FOREARM  Final   Special Requests   Final    BOTTLES DRAWN AEROBIC AND ANAEROBIC Blood Culture adequate volume   Culture   Final    NO GROWTH 5 DAYS Performed at [facility], [place] [address] [place], [place] [address]    Report Status 01/29/2020 FINAL  Final     Labs: Basic Metabolic Panel: Recent Labs  Lab 01/25/20 0500 01/26/20 0119 01/27/20 0319 01/28/20 0413 01/29/20 0123  NA 139 140 141 140 139  K 3.6 3.8 3.9 4.1 3.9  CL 104 106 106 108 107  CO2 22 20* 25 22 19*  GLUCOSE 101* 138* 141* 112* 92  BUN '14 17 23 ' 27* 25*  CREATININE 0.78 0.77 0.94 1.09* 0.96  CALCIUM 8.1* 8.3* 8.6* 8.6* 8.8*  MG 1.8 2.0 2.3 2.3 2.2  PHOS 2.8 2.8  3.2 4.1 3.8   Liver Function Tests: Recent Labs  Lab 01/25/20 0500 01/26/20 0119 01/27/20 0319 01/28/20 0413 01/29/20 0123  AST 45* 51* 55* 91* 48*  ALT 30 38 51* 106* 91*  ALKPHOS 65 61 57 55 60  BILITOT 1.0 0.7 0.3 0.4 0.7  PROT 5.9* 6.0* 5.7* 5.6* 5.7*  ALBUMIN 2.5* 2.6* 2.5* 2.5* 2.8*   CBC: Recent Labs  Lab 01/25/20 0500 01/26/20 0119 01/27/20 0319 01/28/20 0413 01/29/20 0123  WBC 3.6* 3.3* 7.2 8.7 7.1  NEUTROABS 3.0 2.4 6.1 6.8 5.1  HGB 12.2 12.4 12.3 12.5 13.4  HCT 39.4 40.2 38.9 39.9  41.3  MCV 86.6 87.4 86.3 85.4 85.5  PLT 173 218 291 306 316   CBG: No results for input(s): GLUCAP in the last 168 hours. Hgb A1c No results for input(s): HGBA1C in the last 72 hours. Lipid Profile No results for input(s): CHOL, HDL, LDLCALC, TRIG, CHOLHDL, LDLDIRECT in the last 72 hours. Thyroid function studies No results for input(s): TSH, T4TOTAL, T3FREE, THYROIDAB in the last 72 hours.  Invalid input(s): FREET3 Urinalysis    Component Value Date/Time   COLORURINE YELLOW 08/18/2007 0405   APPEARANCEUR Clear 10/21/2017 1123   LABSPEC 1.031 (H) 08/18/2007 0405   PHURINE 6.0 08/18/2007 0405   GLUCOSEU Negative 10/21/2017 1123   HGBUR TRACE (A) 08/18/2007 0405   BILIRUBINUR Negative 10/21/2017 1123   KETONESUR TRACE (A) 08/18/2007 0405   PROTEINUR Negative 10/21/2017 1123   PROTEINUR NEGATIVE 08/18/2007 0405   UROBILINOGEN 0.2 08/18/2007 0405   NITRITE Negative 10/21/2017 1123   NITRITE NEGATIVE 08/18/2007 0405   LEUKOCYTESUR 1+ (A) 10/21/2017 1123    FURTHER DISCHARGE INSTRUCTIONS:   Get Medicines reviewed and adjusted: Please take all your medications with you for your next visit with your Primary MD   Laboratory/radiological data: Please request your Primary MD to go over all hospital tests and procedure/radiological results at the follow up, please ask your Primary MD to get all Hospital records sent to his/her office.   In some cases, they will be blood  work, cultures and biopsy results pending at the time of your discharge. Please request that your primary care M.D. goes through all the records of your hospital data and follows up on these results.   Also Note the following: If you experience worsening of your admission symptoms, develop shortness of breath, life threatening emergency, suicidal or homicidal thoughts you must seek medical attention immediately by calling 911 or calling your MD immediately  if symptoms less severe.   You must read complete instructions/literature along with all the possible adverse reactions/side effects for all the Medicines you take and that have been prescribed to you. Take any new Medicines after you have completely understood and accpet all the possible adverse reactions/side effects.    Do not drive when taking Pain medications or sleeping medications (Benzodaizepines)   Do not take more than prescribed Pain, Sleep and Anxiety Medications. It is not advisable to combine anxiety,sleep and pain medications without talking with your primary care practitioner   Special Instructions: If you have smoked or chewed Tobacco  in the last 2 yrs please stop smoking, stop any regular Alcohol  and or any Recreational drug use.   Wear Seat belts while driving.   Please note: You were cared for by a hospitalist during your hospital stay. Once you are discharged, your primary care physician will handle any further medical issues. Please note that NO REFILLS for any discharge medications will be authorized once you are discharged, as it is imperative that you return to your primary care physician (or establish a relationship with a primary care physician if you do not have one) for your post hospital discharge needs so that they can reassess your need for medications and monitor your lab values.  Time coordinating discharge: 35 minutes  SIGNED:  Marzetta Board, MD, PhD 01/29/2020, 12:59 PM

## 2020-01-29 NOTE — Progress Notes (Signed)
Patient discharge teaching given, including activity, diet, follow-up appoints, and medications. Patient verbalized understanding of all discharge instructions. IV access was d/c'd. Vitals are stable. Skin is intact except as charted in most recent assessments. Pt to be escorted out by staff, to be driven home by family.

## 2020-01-30 ENCOUNTER — Ambulatory Visit: Payer: BC Managed Care – PPO | Admitting: Family Medicine

## 2020-01-31 ENCOUNTER — Ambulatory Visit: Payer: BC Managed Care – PPO | Admitting: Nurse Practitioner

## 2020-02-04 ENCOUNTER — Telehealth: Payer: Self-pay

## 2020-02-04 NOTE — Telephone Encounter (Signed)
Appt scheduled

## 2020-02-06 ENCOUNTER — Ambulatory Visit: Payer: BC Managed Care – PPO | Admitting: Family Medicine

## 2020-02-14 ENCOUNTER — Ambulatory Visit: Payer: BC Managed Care – PPO | Admitting: Nurse Practitioner

## 2020-02-14 ENCOUNTER — Encounter: Payer: Self-pay | Admitting: Nurse Practitioner

## 2020-02-14 ENCOUNTER — Other Ambulatory Visit: Payer: Self-pay

## 2020-02-14 VITALS — BP 145/97 | HR 90 | Temp 97.5°F | Resp 20 | Ht 67.0 in | Wt 264.0 lb

## 2020-02-14 DIAGNOSIS — U071 COVID-19: Secondary | ICD-10-CM

## 2020-02-14 DIAGNOSIS — Z09 Encounter for follow-up examination after completed treatment for conditions other than malignant neoplasm: Secondary | ICD-10-CM

## 2020-02-14 DIAGNOSIS — Z8616 Personal history of COVID-19: Secondary | ICD-10-CM | POA: Diagnosis not present

## 2020-02-14 NOTE — Progress Notes (Signed)
   Subjective:    Patient ID: Toni Francis, female    DOB: March 06, 1956, 64 y.o.   MRN: [id number]   Chief Complaint: Hospitalization Follow-up   HPI Covid + 01/18/2020, full vaccinated, admitted to hospital 9/30-10/08/2019, new onset of O2 @ 2L n/c need during hospitalization which she was able to wean off prior to d/c. States some lingering weakness but energy is slowly coming back.      Review of Systems  Constitutional: Negative.   HENT: Negative.   Eyes: Negative.   Respiratory: Negative.   Cardiovascular: Negative.   Gastrointestinal: Negative.   Endocrine: Negative.   Genitourinary: Negative.   Musculoskeletal: Negative.   Skin: Negative.   Allergic/Immunologic: Negative.   Neurological: Negative.   Hematological: Negative.   Psychiatric/Behavioral: Negative.   All other systems reviewed and are negative.      Objective:   Physical Exam Vitals and nursing note reviewed.  HENT:     Head: Normocephalic and atraumatic.     Right Ear: External ear normal.     Left Ear: External ear normal.     Nose: Nose normal.     Mouth/Throat:     Mouth: Mucous membranes are moist.  Eyes:     Pupils: Pupils are equal, round, and reactive to light.  Cardiovascular:     Rate and Rhythm: Normal rate and regular rhythm.     Pulses: Normal pulses.     Heart sounds: Normal heart sounds.  Pulmonary:     Effort: Pulmonary effort is normal.     Breath sounds: Normal breath sounds and air entry.  Abdominal:     General: Abdomen is flat. Bowel sounds are normal.  Musculoskeletal:        General: Normal range of motion.     Cervical back: Normal range of motion.  Skin:    General: Skin is warm and dry.     Capillary Refill: Capillary refill takes less than 2 seconds.  Neurological:     General: No focal deficit present.     Mental Status: She is alert and oriented to person, place, and time. Mental status is at baseline.  Psychiatric:        Mood and Affect: Mood normal.         Behavior: Behavior normal.        Thought Content: Thought content normal.        Judgment: Judgment normal.    BP (!) 145/97   Pulse 90   Temp (!) 97.5 F (36.4 C) (Temporal)   Resp 20   Ht 5\' 7"  (1.702 m)   Wt 264 lb (119.7 kg)   SpO2 92%   BMI 41.35 kg/m       Assessment & Plan:  Toni Francis in today with chief complaint of Hospitalization Follow-up   Toni Francis in today with chief complaint of Hospitalization Follow-up   1. COVID-19 virus detected Patient has recovered  2. Hospital discharge follow-up Hospital record reviewed    The above assessment and management plan was discussed with the patient. The patient verbalized understanding of and has agreed to the management plan. Patient is aware to call the clinic if symptoms persist or worsen. Patient is aware when to return to the clinic for a follow-up visit. Patient educated on when it is appropriate to go to the emergency department.   Mary-Margaret Hassell Done, FNP

## 2020-02-15 ENCOUNTER — Telehealth: Payer: Self-pay

## 2020-02-15 NOTE — Telephone Encounter (Signed)
Pt called stating that she dropped off FMLA paperwork yesterday and really needs the paperwork done ASAP because she has to mail them in on Monday (02/18/20) so that she can go back to work.  Please call pt to let her know when they are ready.

## 2020-02-15 NOTE — Telephone Encounter (Signed)
FMLA - paperwork filled out and ready - pt aware to pick up from up front.  Copy sent to scan and 1 copy to [facility] desk

## 2020-02-18 ENCOUNTER — Encounter: Payer: Self-pay | Admitting: *Deleted

## 2020-02-25 ENCOUNTER — Telehealth: Payer: Self-pay

## 2020-02-25 NOTE — Telephone Encounter (Signed)
Patient is requesting [place].  Please advise and send  In if approved.

## 2020-02-25 NOTE — Telephone Encounter (Signed)
Chart says she is allergic to pesudoephredine

## 2020-02-26 NOTE — Telephone Encounter (Signed)
Samples up front. Left detailed message on patients voicemail

## 2020-02-26 NOTE — Telephone Encounter (Signed)
We have samples on side b you can give her a few

## 2020-02-26 NOTE — Telephone Encounter (Signed)
Pt was given samples at last visit.

## 2020-02-28 ENCOUNTER — Ambulatory Visit: Payer: BC Managed Care – PPO | Admitting: Family Medicine

## 2020-03-06 ENCOUNTER — Encounter: Payer: Self-pay | Admitting: Nurse Practitioner

## 2020-03-06 ENCOUNTER — Other Ambulatory Visit: Payer: Self-pay

## 2020-03-06 ENCOUNTER — Ambulatory Visit (INDEPENDENT_AMBULATORY_CARE_PROVIDER_SITE_OTHER): Payer: BC Managed Care – PPO | Admitting: Nurse Practitioner

## 2020-03-06 VITALS — BP 139/84 | HR 83 | Temp 97.1°F | Resp 20 | Ht 67.0 in | Wt 268.0 lb

## 2020-03-06 DIAGNOSIS — C73 Malignant neoplasm of thyroid gland: Secondary | ICD-10-CM | POA: Diagnosis not present

## 2020-03-06 DIAGNOSIS — I10 Essential (primary) hypertension: Secondary | ICD-10-CM | POA: Diagnosis not present

## 2020-03-06 DIAGNOSIS — J0100 Acute maxillary sinusitis, unspecified: Secondary | ICD-10-CM | POA: Diagnosis not present

## 2020-03-06 LAB — CMP14+EGFR
ALT: 26 IU/L (ref 0–32)
AST: 28 IU/L (ref 0–40)
Albumin/Globulin Ratio: 2.1 (ref 1.2–2.2)
Albumin: 3.8 g/dL (ref 3.8–4.8)
Alkaline Phosphatase: 68 IU/L (ref 44–121)
BUN/Creatinine Ratio: 12 (ref 12–28)
BUN: 12 mg/dL (ref 8–27)
Bilirubin Total: 0.5 mg/dL (ref 0.0–1.2)
CO2: 24 mmol/L (ref 20–29)
Calcium: 8.8 mg/dL (ref 8.7–10.3)
Chloride: 105 mmol/L (ref 96–106)
Creatinine, Ser: 0.97 mg/dL (ref 0.57–1.00)
GFR calc Af Amer: 72 mL/min/{1.73_m2} (ref >59)
GFR calc non Af Amer: 62 mL/min/{1.73_m2} (ref >59)
Globulin, Total: 1.8 g/dL (ref 1.5–4.5)
Glucose: 86 mg/dL (ref 65–99)
Potassium: 4.4 mmol/L (ref 3.5–5.2)
Sodium: 143 mmol/L (ref 134–144)
Total Protein: 5.6 g/dL — ABNORMAL LOW (ref 6.0–8.5)

## 2020-03-06 LAB — CBC WITH DIFFERENTIAL/PLATELET
Basophils Absolute: 0.1 10*3/uL (ref 0.0–0.2)
Basos: 2 %
EOS (ABSOLUTE): 0.1 10*3/uL (ref 0.0–0.4)
Eos: 2 %
Hematocrit: 39.6 % (ref 34.0–46.6)
Hemoglobin: 12.8 g/dL (ref 11.1–15.9)
Immature Grans (Abs): 0.1 10*3/uL (ref 0.0–0.1)
Immature Granulocytes: 1 %
Lymphocytes Absolute: 1.7 10*3/uL (ref 0.7–3.1)
Lymphs: 24 %
MCH: 27.9 pg (ref 26.6–33.0)
MCHC: 32.3 g/dL (ref 31.5–35.7)
MCV: 86 fL (ref 79–97)
Monocytes Absolute: 0.9 10*3/uL (ref 0.1–0.9)
Monocytes: 12 %
Neutrophils Absolute: 4.4 10*3/uL (ref 1.4–7.0)
Neutrophils: 59 %
Platelets: 266 10*3/uL (ref 150–450)
RBC: 4.59 x10E6/uL (ref 3.77–5.28)
RDW: 15 % (ref 11.7–15.4)
WBC: 7.2 10*3/uL (ref 3.4–10.8)

## 2020-03-06 LAB — LIPID PANEL
Chol/HDL Ratio: 3.5 ratio (ref 0.0–4.4)
Cholesterol, Total: 136 mg/dL (ref 100–199)
HDL: 39 mg/dL — ABNORMAL LOW (ref >39)
LDL Chol Calc (NIH): 71 mg/dL (ref 0–99)
Triglycerides: 147 mg/dL (ref 0–149)
VLDL Cholesterol Cal: 26 mg/dL (ref 5–40)

## 2020-03-06 MED ORDER — OMEPRAZOLE 20 MG PO CPDR: 20.0000 mg | DELAYED_RELEASE_CAPSULE | Freq: Every evening | ORAL | 5 refills | Status: DC

## 2020-03-06 MED ORDER — DOXYCYCLINE HYCLATE 100 MG PO TABS: 100.0000 mg | ORAL_TABLET | Freq: Two times a day (BID) | ORAL | 0 refills | Status: DC

## 2020-03-06 MED ORDER — TRIAMTERENE-HCTZ 37.5-25 MG PO TABS: 1.0000 | ORAL_TABLET | Freq: Every day | ORAL | 3 refills | Status: DC

## 2020-03-06 NOTE — Patient Instructions (Signed)
Peripheral Edema  Peripheral edema is swelling that is caused by a buildup of fluid. Peripheral edema most often affects the lower legs, ankles, and feet. It can also develop in the arms, hands, and face. The area of the body that has peripheral edema will look swollen. It may also feel heavy or warm. Your clothes may start to feel tight. Pressing on the area may make a temporary dent in your skin. You may not be able to move your swollen arm or leg as much as usual. There are many causes of peripheral edema. It can happen because of a complication of other conditions such as congestive heart failure, kidney disease, or a problem with your blood circulation. It also can be a side effect of certain medicines or because of an infection. It often happens to women during pregnancy. Sometimes, the cause is not known. Follow these instructions at home: Managing pain, stiffness, and swelling   Raise (elevate) your legs while you are sitting or lying down.  Move around often to prevent stiffness and to lessen swelling.  Do not sit or stand for long periods of time.  Wear support stockings as told by your health care provider. Medicines  Take over-the-counter and prescription medicines only as told by your health care provider.  Your health care provider may prescribe medicine to help your body get rid of excess water (diuretic). General instructions  Pay attention to any changes in your symptoms.  Follow instructions from your health care provider about limiting salt (sodium) in your diet. Sometimes, eating less salt may reduce swelling.  Moisturize skin daily to help prevent skin from cracking and draining.  Keep all follow-up visits as told by your health care provider. This is important. Contact a health care provider if you have:  A fever.  Edema that starts suddenly or is getting worse, especially if you are pregnant or have a medical condition.  Swelling in only one leg.  Increased  swelling, redness, or pain in one or both of your legs.  Drainage or sores at the area where you have edema. Get help right away if you:  Develop shortness of breath, especially when you are lying down.  Have pain in your chest or abdomen.  Feel weak.  Feel faint. Summary  Peripheral edema is swelling that is caused by a buildup of fluid. Peripheral edema most often affects the lower legs, ankles, and feet.  Move around often to prevent stiffness and to lessen swelling. Do not sit or stand for long periods of time.  Pay attention to any changes in your symptoms.  Contact a health care provider if you have edema that starts suddenly or is getting worse, especially if you are pregnant or have a medical condition.  Get help right away if you develop shortness of breath, especially when lying down. This information is not intended to replace advice given to you by your health care provider. Make sure you discuss any questions you have with your health care provider. Document Revised: 01/04/2018 Document Reviewed: 01/04/2018 Elsevier Patient Education  2020 Elsevier Inc.  

## 2020-03-06 NOTE — Progress Notes (Signed)
Subjective:    Patient ID: Toni Francis, female    DOB: 03/27/1956, 64 y.o.   MRN: [id number]   Chief Complaint: Medical Management of Chronic Issues    HPI:  1. Primary hypertension No c/o chest pain, sob or headache. Does not check blood pressure at home. BP Readings from Last 3 Encounters:  03/06/20 139/84  02/14/20 (!) 145/97  01/29/20 108/75     2. Thyroid cancer ([place]) Had right side of thyroid removed earlier thia year. She had no other treatment. She follows up with DR. Newman yearly.  3. Morbid obesity ([place]) Weight is up 4lbs Wt Readings from Last 3 Encounters:  03/06/20 268 lb (121.6 kg)  02/14/20 264 lb (119.7 kg)  01/25/20 268 lb 8.3 oz (121.8 kg)   BMI Readings from Last 3 Encounters:  03/06/20 41.97 kg/m  02/14/20 41.35 kg/m  01/25/20 42.06 kg/m       Outpatient Encounter Medications as of 03/06/2020  Medication Sig  . acetaminophen (TYLENOL) 500 MG tablet Take 1,000 mg by mouth daily as needed for moderate pain or headache.  . Ascorbic Acid (VITAMIN C) 500 MG CAPS Take by mouth.  . bacitracin ointment Apply 1 application topically every 8 (eight) hours. (Patient not taking: Reported on 01/25/2020)  . Calcium Citrate-Vitamin D (CALCIUM + D PO) Take 1 tablet by mouth daily.  . fluticasone (FLONASE) 50 MCG/ACT nasal spray USE 2 SPRAY(S) IN EACH NOSTRIL TWICE DAILY  . guaiFENesin-dextromethorphan (ROBITUSSIN DM) 100-10 MG/5ML syrup Take 10 mLs by mouth every 4 (four) hours as needed for cough.  . levocetirizine (XYZAL) 5 MG tablet TAKE 1 TABLET BY MOUTH IN THE EVENING FOR ALLERGIES (Patient taking differently: Take 5 mg by mouth daily. )  . meloxicam (MOBIC) 15 MG tablet Take 1 tablet by mouth once daily  . Multiple Vitamins-Minerals (CENTRUM SILVER PO) Take 1 tablet by mouth daily.  . Multiple Vitamins-Minerals (EYE VITAMINS PO) Take 1 capsule by mouth daily.  . Nutritional Supplements (VITAMIN D BOOSTER PO) Take by mouth.  . Omega-3 Fatty Acids  (FISH OIL PO) Take 1 capsule by mouth daily.   Marland Kitchen omeprazole (PRILOSEC) 20 MG capsule Take 20 mg by mouth every evening.  . triamterene-hydrochlorothiazide (MAXZIDE-25) 37.5-25 MG tablet Take 1 tablet by mouth daily. For blood pressure and fluid  . ZINC OXIDE PO Take by mouth.   No facility-administered encounter medications on file as of 03/06/2020.    Past Surgical History:  Procedure Laterality Date  . CHOLECYSTECTOMY    . THYROIDECTOMY Right 08/13/2019   Procedure: RIGHT THYROID LOBECTOMY;  Surgeon: Rozetta Nunnery, MD;  Location: [facility] OR;  Service: ENT;  Laterality: Right;    Family History  Problem Relation Age of Onset  . Colon cancer Maternal Uncle 69    New complaints: C/o bil sinus pressure that has been coming on for about 10 days now. No fever or cough.  Social history: Lives with her dad and brother.  Controlled substance contract: n/a    Review of Systems  Constitutional: Negative for chills, diaphoresis and fever.  HENT: Positive for ear pain, sinus pressure and sinus pain.   Eyes: Negative for pain.  Respiratory: Negative for cough and shortness of breath.   Cardiovascular: Negative for chest pain, palpitations and leg swelling.  Gastrointestinal: Negative for abdominal pain.  Endocrine: Negative for polydipsia.  Skin: Negative for rash.  Neurological: Negative for dizziness, weakness and headaches.  Hematological: Does not bruise/bleed easily.  All other systems reviewed and are  negative.      Objective:   Physical Exam Vitals and nursing note reviewed.  Constitutional:      General: She is not in acute distress.    Appearance: Normal appearance. She is well-developed.  HENT:     Head: Normocephalic.     Nose: Nose normal.  Eyes:     Pupils: Pupils are equal, round, and reactive to light.  Neck:     Vascular: No carotid bruit or JVD.  Cardiovascular:     Rate and Rhythm: Normal rate and regular rhythm.     Heart sounds: Normal heart  sounds.  Pulmonary:     Effort: Pulmonary effort is normal. No respiratory distress.     Breath sounds: Normal breath sounds. No wheezing or rales.  Chest:     Chest wall: No tenderness.  Abdominal:     General: Bowel sounds are normal. There is no distension or abdominal bruit.     Palpations: Abdomen is soft. There is no hepatomegaly, splenomegaly, mass or pulsatile mass.     Tenderness: There is no abdominal tenderness.  Musculoskeletal:        General: Normal range of motion.     Cervical back: Normal range of motion and neck supple.  Lymphadenopathy:     Cervical: No cervical adenopathy.  Skin:    General: Skin is warm and dry.  Neurological:     Mental Status: She is alert and oriented to person, place, and time.     Deep Tendon Reflexes: Reflexes are normal and symmetric.  Psychiatric:        Behavior: Behavior normal.        Thought Content: Thought content normal.        Judgment: Judgment normal.    BP 139/84   Pulse 83   Temp (!) 97.1 F (36.2 C) (Temporal)   Resp 20   Ht '5\' 7"'  (1.702 m)   Wt 268 lb (121.6 kg)   SpO2 94%   BMI 41.97 kg/m         Assessment & Plan:  Toni Francis comes in today with chief complaint of Medical Management of Chronic Issues ([place])   Diagnosis and orders addressed:  1. Primary hypertension Low sodium ddiet - CBC with Differential/Platelet - CMP14+EGFR - Lipid panel - triamterene-hydrochlorothiazide (MAXZIDE-25) 37.5-25 MG tablet; Take 1 tablet by mouth daily. For blood pressure and fluid  Dispense: 90 tablet; Refill: 3  2. Thyroid cancer ([place]) Keep yearly follow up  3. Morbid obesity ([place]) Discussed diet and exercise for person with BMI >25 Will recheck weight in 3-6 months   4. Acute non-recurrent maxillary sinusitis 1. Take meds as prescribed 2. Use a cool mist humidifier especially during the winter months and when heat has been humid. 3. Use saline nose sprays frequently 4. Saline irrigations of the nose  can be very helpful if done frequently.  * 4X daily for 1 week*  * Use of a nettie pot can be helpful with this. Follow directions with this* 5. Drink plenty of fluids 6. Keep thermostat turn down low 7.For any cough or congestion  Use plain Mucinex- regular strength or max strength is fine   * Children- consult with Pharmacist for dosing 8. For fever or aces or pains- take tylenol or ibuprofen appropriate for age and weight.  * for fevers greater than 101 orally you may alternate ibuprofen and tylenol every  3 hours.    - doxycycline (VIBRA-TABS) 100 MG tablet; Take 1 tablet (  100 mg total) by mouth 2 (two) times daily. 1 po bid  Dispense: 20 tablet; Refill: 0   Labs pending Health Maintenance reviewed Diet and exercise encouraged  Follow up plan: 6 months   Mary-Margaret Hassell Done, FNP

## 2020-03-25 ENCOUNTER — Other Ambulatory Visit: Payer: Self-pay | Admitting: Family Medicine

## 2020-03-25 DIAGNOSIS — J0111 Acute recurrent frontal sinusitis: Secondary | ICD-10-CM

## 2020-03-26 ENCOUNTER — Telehealth: Payer: Self-pay

## 2020-03-26 NOTE — Telephone Encounter (Signed)
Patient aware and verbalizes understanding. 

## 2020-03-26 NOTE — Telephone Encounter (Signed)
lmtcb

## 2020-03-26 NOTE — Telephone Encounter (Signed)
135/88 is good- just keep checking- was probably high due to back pain

## 2020-03-26 NOTE — Telephone Encounter (Signed)
Patient states that she has a pinched nerve in her back and she annoyed it up on Thanksgiving due to her cooking all day.  States since then her BP has been running higher than normal and she has been having headaches and facial redness.  States that is what happens when her BP is elevated.  She took her BP last night and it was 145/100 and this morning it was 135/88.  She is worried about the bottom number and would like to know if he BP medication needs to be increased. Please review and advise.

## 2020-06-27 ENCOUNTER — Other Ambulatory Visit: Payer: Self-pay | Admitting: Family Medicine

## 2020-06-27 DIAGNOSIS — M1990 Unspecified osteoarthritis, unspecified site: Secondary | ICD-10-CM

## 2020-06-27 MED ORDER — MELOXICAM 15 MG PO TABS: 15.0000 mg | ORAL_TABLET | Freq: Every day | ORAL | 0 refills | Status: DC

## 2020-08-11 ENCOUNTER — Encounter: Payer: Self-pay | Admitting: Family Medicine

## 2020-08-11 ENCOUNTER — Other Ambulatory Visit: Payer: Self-pay

## 2020-08-11 ENCOUNTER — Ambulatory Visit: Payer: BC Managed Care – PPO | Admitting: Family Medicine

## 2020-08-11 DIAGNOSIS — J0101 Acute recurrent maxillary sinusitis: Secondary | ICD-10-CM

## 2020-08-11 MED ORDER — CEFDINIR 300 MG PO CAPS: 300.0000 mg | ORAL_CAPSULE | Freq: Two times a day (BID) | ORAL | 0 refills | Status: DC

## 2020-08-11 NOTE — Progress Notes (Signed)
Telephone visit  Subjective: CC: sinus infection PCP: Chevis Pretty, FNP Toni Francis is a 65 y.o. female calls for telephone consult today. Patient provides verbal consent for consult held via phone.  Due to COVID-19 pandemic this visit was conducted virtually. This visit type was conducted due to national recommendations for restrictions regarding the COVID-19 Pandemic (e.g. social distancing, sheltering in place) in an effort to limit this patient's exposure and mitigate transmission in our community. All issues noted in this document were discussed and addressed.  A physical exam was not performed with this format.   Location of patient: home Location of provider: [facility] Others present for call: none  1. Sinus infection She reports sinus pressure with pain around eyes and yellow thick nasal discharge.  She denies fever.  She is using saline nasal spray, robitussin capsules (last week), switched to Nyquil (mild relief), flonase. She has been dealing with symptoms for at least 2 weeks and they are progressive.  She has these every year.  No known sick contacts.  ROS: Per HPI  Allergies  Allergen Reactions  . Ampicillin Rash    Did it involve swelling of the face/tongue/throat, SOB, or low BP? No Did it involve sudden or severe rash/hives, skin peeling, or any reaction on the inside of your mouth or nose? Yes Did you need to seek medical attention at a hospital or doctor's office? Yes When did it last happen?10 + years If all above answers are "NO", may proceed with cephalosporin use.   . Codeine Nausea And Vomiting and Other (See Comments)    Also causes headaches   . Pseudoephedrine Palpitations   Past Medical History:  Diagnosis Date  . Abdominal hernia   . Arthritis   . GERD (gastroesophageal reflux disease)   . Hypertension   . Pinched nerve    L4-5,back    Current Outpatient Medications:  .  acetaminophen (TYLENOL) 500 MG tablet, Take 1,000 mg by  mouth daily as needed for moderate pain or headache., Disp: , Rfl:  .  Ascorbic Acid (VITAMIN C) 500 MG CAPS, Take by mouth., Disp: , Rfl:  .  Calcium Citrate-Vitamin D (CALCIUM + D PO), Take 1 tablet by mouth daily., Disp: , Rfl:  .  doxycycline (VIBRA-TABS) 100 MG tablet, Take 1 tablet (100 mg total) by mouth 2 (two) times daily. 1 po bid, Disp: 20 tablet, Rfl: 0 .  fluticasone (FLONASE) 50 MCG/ACT nasal spray, USE 2 SPRAY(S) IN EACH NOSTRIL TWICE DAILY, Disp: 48 g, Rfl: 0 .  levocetirizine (XYZAL) 5 MG tablet, TAKE 1 TABLET BY MOUTH IN THE EVENING FOR ALLERGIES, Disp: 90 tablet, Rfl: 1 .  meloxicam (MOBIC) 15 MG tablet, Take 1 tablet (15 mg total) by mouth daily., Disp: 90 tablet, Rfl: 0 .  Multiple Vitamins-Minerals (CENTRUM SILVER PO), Take 1 tablet by mouth daily., Disp: , Rfl:  .  Multiple Vitamins-Minerals (EYE VITAMINS PO), Take 1 capsule by mouth daily., Disp: , Rfl:  .  Nutritional Supplements (VITAMIN D BOOSTER PO), Take by mouth., Disp: , Rfl:  .  Omega-3 Fatty Acids (FISH OIL PO), Take 1 capsule by mouth daily. , Disp: , Rfl:  .  omeprazole (PRILOSEC) 20 MG capsule, Take 1 capsule (20 mg total) by mouth every evening., Disp: 30 capsule, Rfl: 5 .  triamterene-hydrochlorothiazide (MAXZIDE-25) 37.5-25 MG tablet, Take 1 tablet by mouth daily. For blood pressure and fluid, Disp: 90 tablet, Rfl: 3 .  ZINC OXIDE PO, Take by mouth., Disp: , Rfl:  Assessment/ Plan: 65 y.o. female   Acute recurrent maxillary sinusitis - Plan: cefdinir (OMNICEF) 300 MG capsule  Empiric treatment with Omnicef.  It sounds like symptoms are getting worse and are consistent with a secondary bacterial infection.  Continue home therapies including nasal spray, sinus rinses etc.  She understands reasons for reevaluation.  Follow-up as needed  Start time: 9:21am (LVM); 10:30am End time: 10:35am  Total time spent on patient care (including telephone call/ virtual visit): 5 minutes  [place],  [place] [phone]

## 2020-09-04 ENCOUNTER — Ambulatory Visit: Payer: Self-pay | Admitting: Nurse Practitioner

## 2020-09-13 ENCOUNTER — Other Ambulatory Visit: Payer: Self-pay | Admitting: Nurse Practitioner

## 2020-09-26 ENCOUNTER — Ambulatory Visit: Payer: Self-pay | Admitting: Nurse Practitioner

## 2020-09-29 ENCOUNTER — Ambulatory Visit: Payer: Self-pay | Admitting: Nurse Practitioner

## 2020-10-08 ENCOUNTER — Ambulatory Visit: Payer: BC Managed Care – PPO | Admitting: Nurse Practitioner

## 2020-10-08 ENCOUNTER — Encounter: Payer: Self-pay | Admitting: Nurse Practitioner

## 2020-10-08 ENCOUNTER — Other Ambulatory Visit: Payer: Self-pay

## 2020-10-08 VITALS — BP 122/76 | HR 75 | Temp 97.8°F | Resp 20 | Ht 67.0 in | Wt 249.0 lb

## 2020-10-08 DIAGNOSIS — I10 Essential (primary) hypertension: Secondary | ICD-10-CM

## 2020-10-08 DIAGNOSIS — K219 Gastro-esophageal reflux disease without esophagitis: Secondary | ICD-10-CM

## 2020-10-08 DIAGNOSIS — C73 Malignant neoplasm of thyroid gland: Secondary | ICD-10-CM

## 2020-10-08 DIAGNOSIS — J301 Allergic rhinitis due to pollen: Secondary | ICD-10-CM

## 2020-10-08 MED ORDER — OMEPRAZOLE 20 MG PO CPDR: 20.0000 mg | DELAYED_RELEASE_CAPSULE | Freq: Every day | ORAL | 0 refills | Status: DC

## 2020-10-08 MED ORDER — LEVOCETIRIZINE DIHYDROCHLORIDE 5 MG PO TABS: 5.0000 mg | ORAL_TABLET | Freq: Every evening | ORAL | 1 refills | Status: DC

## 2020-10-08 MED ORDER — TRIAMTERENE-HCTZ 37.5-25 MG PO TABS: 1.0000 | ORAL_TABLET | Freq: Every day | ORAL | 3 refills | Status: DC

## 2020-10-08 MED ORDER — FLUTICASONE PROPIONATE 50 MCG/ACT NA SUSP: NASAL | 0 refills | Status: DC

## 2020-10-08 NOTE — Progress Notes (Signed)
Subjective:    Patient ID: Toni Francis, female    DOB: [birth year], 65 y.o.   MRN: [id number]   Chief Complaint: Medical Management of Chronic Issues    HPI:  1. Primary hypertension No c/o chest pain, sob or headache.does not check blood pressure at home. BP Readings from Last 3 Encounters:  03/06/20 139/84  02/14/20 (!) 145/97  01/29/20 108/75     2. Thyroid cancer ([place]) She had right side of thyroid removed years ago. She is not on any replacementment medication. Lab Results  Component Value Date   TSH 5.723 (H) 01/24/2020     3. Seasonal allergic rhinitis due to pollen Is currently on xyzal and flonase. Combination is working well for her.   4. GERD Is on a daily dose of omeprazole and is doing well.  5. Morbid obesity ([place]) Weight is down 19lbs from previous visit Wt Readings from Last 3 Encounters:  10/08/20 249 lb (112.9 kg)  03/06/20 268 lb (121.6 kg)  02/14/20 264 lb (119.7 kg)   BMI Readings from Last 3 Encounters:  10/08/20 39.00 kg/m  03/06/20 41.97 kg/m  02/14/20 41.35 kg/m       Outpatient Encounter Medications as of 10/08/2020  Medication Sig   acetaminophen (TYLENOL) 500 MG tablet Take 1,000 mg by mouth daily as needed for moderate pain or headache.   Ascorbic Acid (VITAMIN C) 500 MG CAPS Take by mouth.   Calcium Citrate-Vitamin D (CALCIUM + D PO) Take 1 tablet by mouth daily.   fluticasone (FLONASE) 50 MCG/ACT nasal spray USE 2 SPRAY(S) IN EACH NOSTRIL TWICE DAILY   levocetirizine (XYZAL) 5 MG tablet TAKE 1 TABLET BY MOUTH IN THE EVENING FOR ALLERGIES   meloxicam (MOBIC) 15 MG tablet Take 1 tablet (15 mg total) by mouth daily.   Multiple Vitamins-Minerals (CENTRUM SILVER PO) Take 1 tablet by mouth daily.   Multiple Vitamins-Minerals (EYE VITAMINS PO) Take 1 capsule by mouth daily.   Nutritional Supplements (VITAMIN D BOOSTER PO) Take by mouth.   Omega-3 Fatty Acids (FISH OIL PO) Take 1 capsule by mouth daily.    omeprazole (PRILOSEC)  20 MG capsule TAKE 1 CAPSULE BY MOUTH IN THE EVENING   triamterene-hydrochlorothiazide (MAXZIDE-25) 37.5-25 MG tablet Take 1 tablet by mouth daily. For blood pressure and fluid   ZINC OXIDE PO Take by mouth.   [DISCONTINUED] cefdinir (OMNICEF) 300 MG capsule Take 1 capsule (300 mg total) by mouth 2 (two) times daily. 1 po BID   [DISCONTINUED] doxycycline (VIBRA-TABS) 100 MG tablet Take 1 tablet (100 mg total) by mouth 2 (two) times daily. 1 po bid   No facility-administered encounter medications on file as of 10/08/2020.    Past Surgical History:  Procedure Laterality Date   CHOLECYSTECTOMY     THYROIDECTOMY Right 08/13/2019   Procedure: RIGHT THYROID LOBECTOMY;  Surgeon: Toni Nunnery, MD;  Location: [facility] OR;  Service: ENT;  Laterality: Right;    Family History  Problem Relation Age of Onset   Colon cancer Maternal Uncle 59    New complaints: None today  Social history: Lives with her dad and her brother  Controlled substance contract: n/a     Review of Systems  Constitutional:  Negative for diaphoresis.  Eyes:  Negative for pain.  Respiratory:  Negative for shortness of breath.   Cardiovascular:  Negative for chest pain, palpitations and leg swelling.  Gastrointestinal:  Negative for abdominal pain.  Endocrine: Negative for polydipsia.  Skin:  Negative for rash.  Neurological:  Negative for dizziness, weakness and headaches.  Hematological:  Does not bruise/bleed easily.  All other systems reviewed and are negative.     Objective:   Physical Exam Vitals and nursing note reviewed.  Constitutional:      General: She is not in acute distress.    Appearance: Normal appearance. She is well-developed.  HENT:     Head: Normocephalic.     Right Ear: Tympanic membrane normal.     Left Ear: Tympanic membrane normal.     Nose: Nose normal.     Mouth/Throat:     Mouth: Mucous membranes are moist.  Eyes:     Pupils: Pupils are equal, round, and reactive to light.   Neck:     Vascular: No carotid bruit or JVD.  Cardiovascular:     Rate and Rhythm: Normal rate and regular rhythm.     Heart sounds: Normal heart sounds.  Pulmonary:     Effort: Pulmonary effort is normal. No respiratory distress.     Breath sounds: Normal breath sounds. No wheezing or rales.  Chest:     Chest wall: No tenderness.  Abdominal:     General: Bowel sounds are normal. There is no distension or abdominal bruit.     Palpations: Abdomen is soft. There is no hepatomegaly, splenomegaly, mass or pulsatile mass.     Tenderness: There is no abdominal tenderness.  Musculoskeletal:        General: Normal range of motion.     Cervical back: Normal range of motion and neck supple.  Lymphadenopathy:     Cervical: No cervical adenopathy.  Skin:    General: Skin is warm and dry.  Neurological:     Mental Status: She is alert and oriented to person, place, and time.     Deep Tendon Reflexes: Reflexes are normal and symmetric.  Psychiatric:        Behavior: Behavior normal.        Thought Content: Thought content normal.        Judgment: Judgment normal.    BP 122/76   Pulse 75   Temp 97.8 F (36.6 C) (Temporal)   Resp 20   Ht '5\' 7"'  (1.702 m)   Wt 249 lb (112.9 kg)   SpO2 95%   BMI 39.00 kg/m        Assessment & Plan:   Toni Francis comes in today with chief complaint of Medical Management of Chronic Issues (Wants to discuss low protein and also has warts on hands and left knee)   Diagnosis and orders addressed:  1. Primary hypertension Low sodium diet - triamterene-hydrochlorothiazide (MAXZIDE-25) 37.5-25 MG tablet; Take 1 tablet by mouth daily. For blood pressure and fluid  Dispense: 90 tablet; Refill: 3 - CBC with Differential/Platelet - CMP14+EGFR - Lipid panel  2. Thyroid cancer [facility]) May need to go on thyroid replacement meds - Thyroid Panel With TSH  3. Seasonal allergic rhinitis due to pollen - fluticasone (FLONASE) 50 MCG/ACT nasal spray; USE 2  SPRAY(S) IN EACH NOSTRIL TWICE DAILY  Dispense: 48 g; Refill: 0 - levocetirizine (XYZAL) 5 MG tablet; Take 1 tablet (5 mg total) by mouth every evening.  Dispense: 90 tablet; Refill: 1  4. Morbid obesity ([place]) Discussed diet and exercise for person with BMI >25 Will recheck weight in 3-6 months  5. Gastroesophageal reflux disease without esophagitis Avoid spicy foods Do not eat 2 hours prior to bedtime - omeprazole (PRILOSEC) 20 MG capsule; Take 1 capsule (20  mg total) by mouth daily.  Dispense: 30 capsule; Refill: 0   Labs pending Health Maintenance reviewed Diet and exercise encouraged  Follow up plan: 6 months   Mary-Margaret Hassell Done, FNP

## 2020-10-08 NOTE — Patient Instructions (Signed)
Hyperthyroidism  Hyperthyroidism is when the thyroid gland is too active (overactive). The thyroid gland is a small gland located in the lower front part of the neck, just in front of the windpipe (trachea). This gland makes hormones that help control how the body uses food for energy (metabolism) as well as how the heart and brain function. These hormones also play a role in keeping your bones strong. When the thyroid is overactive, it produces toomuch of a hormone called thyroxine. What are the causes? This condition may be caused by: Graves' disease. This is a disorder in which the body's disease-fighting system (immune system) attacks the thyroid gland. This is the most common cause. Inflammation of the thyroid gland. A tumor in the thyroid gland. Use of certain medicines, including: Prescription thyroid hormone replacement. Herbal supplements that mimic thyroid hormones. Amiodarone therapy. Solid or fluid-filled lumps within your thyroid gland (thyroid nodules). Taking in a large amount of iodine from foods or medicines. What increases the risk? You are more likely to develop this condition if: You are female. You have a family history of thyroid conditions. You smoke tobacco. You use a medicine called lithium. You take medicines that affect the immune system (immunosuppressants). What are the signs or symptoms? Symptoms of this condition include: Nervousness. Inability to tolerate heat. Unexplained weight loss. Diarrhea. Change in the texture of hair or skin. Heart skipping beats or making extra beats. Rapid heart rate. Loss of menstruation. Shaky hands. Fatigue. Restlessness. Sleep problems. Enlarged thyroid gland or a lump in the thyroid (nodule). You may also have symptoms of Graves' disease, which may include: Protruding eyes. Dry eyes. Red or swollen eyes. Problems with vision. How is this diagnosed? This condition may be diagnosed based on: Your symptoms and  medical history. A physical exam. Blood tests. Thyroid ultrasound. This test involves using sound waves to produce images of the thyroid gland. A thyroid scan. A radioactive substance is injected into a vein, and images show how much iodine is present in the thyroid. Radioactive iodine uptake test (RAIU). A small amount of radioactive iodine is given by mouth to see how much iodine the thyroid absorbs after a certain amount of time. How is this treated? Treatment depends on the cause and severity of the condition. Treatment may include: Medicines to reduce the amount of thyroid hormone your body makes. Radioactive iodine treatment (radioiodine therapy). This involves swallowing a small dose of radioactive iodine, in capsule or liquid form, to kill thyroid cells. Surgery to remove part or all of your thyroid gland. You may need to take thyroid hormone replacement medicine for the rest of your life after thyroid surgery. Medicines to help manage your symptoms. Follow these instructions at home:  Take over-the-counter and prescription medicines only as told by your health care provider. Do not use any products that contain nicotine or tobacco, such as cigarettes and e-cigarettes. If you need help quitting, ask your health care provider. Follow any instructions from your health care provider about diet. You may be instructed to limit foods that contain iodine. Keep all follow-up visits as told by your health care provider. This is important. You will need to have blood tests regularly so that your health care provider can monitor your condition. Contact a health care provider if: Your symptoms do not get better with treatment. You have a fever. You are taking thyroid hormone replacement medicine and you: Have symptoms of depression. Feel like you are tired all the time. Gain weight. Get help right  away if: You have chest pain. You have decreased alertness or a change in your awareness. You  have abdominal pain. You feel dizzy. You have a rapid heartbeat. You have an irregular heartbeat. You have difficulty breathing. Summary The thyroid gland is a small gland located in the lower front part of the neck, just in front of the windpipe (trachea). Hyperthyroidism is when the thyroid gland is too active (overactive) and produces too much of a hormone called thyroxine. The most common cause is Graves' disease, a disorder in which your immune system attacks the thyroid gland. Hyperthyroidism can cause various symptoms, such as unexplained weight loss, nervousness, inability to tolerate heat, or changes in your heartbeat. Treatment may include medicine to reduce the amount of thyroid hormone your body makes, radioiodine therapy, surgery, or medicines to manage symptoms. This information is not intended to replace advice given to you by your health care provider. Make sure you discuss any questions you have with your healthcare provider. Document Revised: 12/27/2019 Document Reviewed: 12/27/2019 Elsevier Patient Education  2022 [employer].

## 2020-10-09 LAB — CBC WITH DIFFERENTIAL/PLATELET
Basophils Absolute: 0.1 10*3/uL (ref 0.0–0.2)
Basos: 1 %
EOS (ABSOLUTE): 0.1 10*3/uL (ref 0.0–0.4)
Eos: 1 %
Hematocrit: 42.3 % (ref 34.0–46.6)
Hemoglobin: 13.8 g/dL (ref 11.1–15.9)
Immature Grans (Abs): 0 10*3/uL (ref 0.0–0.1)
Immature Granulocytes: 0 %
Lymphocytes Absolute: 1.5 10*3/uL (ref 0.7–3.1)
Lymphs: 19 %
MCH: 27.2 pg (ref 26.6–33.0)
MCHC: 32.6 g/dL (ref 31.5–35.7)
MCV: 83 fL (ref 79–97)
Monocytes Absolute: 0.7 10*3/uL (ref 0.1–0.9)
Monocytes: 8 %
Neutrophils Absolute: 5.6 10*3/uL (ref 1.4–7.0)
Neutrophils: 71 %
Platelets: 257 10*3/uL (ref 150–450)
RBC: 5.08 x10E6/uL (ref 3.77–5.28)
RDW: 13.8 % (ref 11.7–15.4)
WBC: 8 10*3/uL (ref 3.4–10.8)

## 2020-10-09 LAB — LIPID PANEL
Chol/HDL Ratio: 2.8 ratio (ref 0.0–4.4)
Cholesterol, Total: 186 mg/dL (ref 100–199)
HDL: 66 mg/dL (ref >39)
LDL Chol Calc (NIH): 101 mg/dL — ABNORMAL HIGH (ref 0–99)
Triglycerides: 110 mg/dL (ref 0–149)
VLDL Cholesterol Cal: 19 mg/dL (ref 5–40)

## 2020-10-09 LAB — THYROID PANEL WITH TSH
Free Thyroxine Index: 2.2 (ref 1.2–4.9)
T3 Uptake Ratio: 27 % (ref 24–39)
T4, Total: 8 ug/dL (ref 4.5–12.0)
TSH: 10.7 u[IU]/mL — ABNORMAL HIGH (ref 0.450–4.500)

## 2020-10-09 LAB — CMP14+EGFR
ALT: 17 IU/L (ref 0–32)
AST: 15 IU/L (ref 0–40)
Albumin/Globulin Ratio: 2.3 — ABNORMAL HIGH (ref 1.2–2.2)
Albumin: 4.1 g/dL (ref 3.8–4.8)
Alkaline Phosphatase: 81 IU/L (ref 44–121)
BUN/Creatinine Ratio: 16 (ref 12–28)
BUN: 18 mg/dL (ref 8–27)
Bilirubin Total: 0.5 mg/dL (ref 0.0–1.2)
CO2: 24 mmol/L (ref 20–29)
Calcium: 9.6 mg/dL (ref 8.7–10.3)
Chloride: 106 mmol/L (ref 96–106)
Creatinine, Ser: 1.1 mg/dL — ABNORMAL HIGH (ref 0.57–1.00)
Globulin, Total: 1.8 g/dL (ref 1.5–4.5)
Glucose: 90 mg/dL (ref 65–99)
Potassium: 4.6 mmol/L (ref 3.5–5.2)
Sodium: 144 mmol/L (ref 134–144)
Total Protein: 5.9 g/dL — ABNORMAL LOW (ref 6.0–8.5)
eGFR: 56 mL/min/{1.73_m2} — ABNORMAL LOW (ref >59)

## 2020-10-09 MED ORDER — LEVOTHYROXINE SODIUM 75 MCG PO TABS: 75.0000 ug | ORAL_TABLET | Freq: Every day | ORAL | 1 refills | Status: DC

## 2020-10-09 NOTE — Addendum Note (Signed)
Addended by: Chevis Pretty on: 10/09/2020 01:42 PM   Modules accepted: Orders

## 2020-10-10 ENCOUNTER — Other Ambulatory Visit: Payer: Self-pay

## 2020-10-10 DIAGNOSIS — C73 Malignant neoplasm of thyroid gland: Secondary | ICD-10-CM

## 2020-10-10 DIAGNOSIS — R7989 Other specified abnormal findings of blood chemistry: Secondary | ICD-10-CM

## 2020-10-13 ENCOUNTER — Telehealth: Payer: Self-pay | Admitting: Nurse Practitioner

## 2020-10-13 NOTE — Telephone Encounter (Signed)
LOV 10/08/2020 patient was started on levothyroxine on 10/09/2020.  Please review and advise

## 2020-10-13 NOTE — Telephone Encounter (Signed)
Pt called to let MMM know that she doesn't think she can take that medicine that MMM put her on the other day because since taking it, she has had SOB and headaches.  Please advise and call patient.

## 2020-10-14 NOTE — Telephone Encounter (Signed)
Again that is noit a side effect of synthroid and you really need to beon meds. So lets do referral  to endocrinilogy,. Where would youlike to go.

## 2020-10-14 NOTE — Telephone Encounter (Signed)
Pt calling back about meds. Please call back and advise.

## 2020-10-14 NOTE — Telephone Encounter (Signed)
Patient states that when she started taking the thyroid med that she became SOB. She has been unable to sleep since starting the med so she cut the dose in half but still no better. Took a 1/2 pill yesterday but none today. She is stopping the meds. What would you like her to do instead?

## 2020-10-14 NOTE — Telephone Encounter (Signed)
Patient stated that she does not want to take the medications and that she sees a endocrinologist and he says that she doesn't need any medication. We will reach out to them and see what they would like to do.

## 2020-10-16 ENCOUNTER — Telehealth (INDEPENDENT_AMBULATORY_CARE_PROVIDER_SITE_OTHER): Payer: Self-pay | Admitting: Otolaryngology

## 2020-10-16 ENCOUNTER — Telehealth: Payer: Self-pay | Admitting: Family Medicine

## 2020-10-16 DIAGNOSIS — E890 Postprocedural hypothyroidism: Secondary | ICD-10-CM

## 2020-10-16 NOTE — Telephone Encounter (Signed)
Patient called my office at the recommendation of her primary care physician.  Apparently she had an elevated TSH and was prescribed thyroid replacement hormone but did not tolerate this and was instructed by the doctor who prescribed this to contact me. I discussed with the patient as well as [place] [facility] that I would recommend referral to endocrinology if they are not comfortable treating hypothyroidism as I do not treat this normally. 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

## 2020-10-16 NOTE — Telephone Encounter (Signed)
FYI: Call received from Dr. Lucia Gaskins, ENT. The patient called him to manage her levothyroxine. He called [place] to make sure we were aware he does not do this after thyroid surgery and that if we are unable to manage in our office, she needs a referral to endocrinology. I called patient to discuss with her. She apologized as she states she was told she needed to follow-up with her specialist and he was who she thought she should see since he did her partial thyroidectomy years ago. We discussed that she would need a referral to an endocrinologist as they are different. She explained that when she tried taking the levothyroxine she experienced shortness of breath and feeling like her throat was closing. I did not advise on resuming given her symptoms. I have placed the referral to endocrinology.

## 2020-11-12 ENCOUNTER — Other Ambulatory Visit: Payer: Self-pay | Admitting: Nurse Practitioner

## 2020-11-12 DIAGNOSIS — K219 Gastro-esophageal reflux disease without esophagitis: Secondary | ICD-10-CM

## 2020-11-18 ENCOUNTER — Telehealth: Payer: BC Managed Care – PPO | Admitting: Physician Assistant

## 2020-11-18 DIAGNOSIS — B9689 Other specified bacterial agents as the cause of diseases classified elsewhere: Secondary | ICD-10-CM

## 2020-11-18 DIAGNOSIS — J019 Acute sinusitis, unspecified: Secondary | ICD-10-CM

## 2020-11-19 ENCOUNTER — Other Ambulatory Visit: Payer: Self-pay | Admitting: Nurse Practitioner

## 2020-11-19 ENCOUNTER — Other Ambulatory Visit: Payer: Self-pay | Admitting: Family Medicine

## 2020-11-19 DIAGNOSIS — M1990 Unspecified osteoarthritis, unspecified site: Secondary | ICD-10-CM

## 2020-11-19 MED ORDER — DOXYCYCLINE HYCLATE 100 MG PO CAPS: 100.0000 mg | ORAL_CAPSULE | Freq: Two times a day (BID) | ORAL | 0 refills | Status: DC

## 2020-11-19 NOTE — Progress Notes (Signed)

## 2020-11-19 NOTE — Progress Notes (Signed)
I have spent 5 minutes in review of e-visit questionnaire, review and updating patient chart, medical decision making and response to patient.   Tate Zagal Cody Christophor Eick, PA-C    

## 2020-11-25 ENCOUNTER — Telehealth: Payer: Self-pay | Admitting: *Deleted

## 2020-11-25 DIAGNOSIS — M1990 Unspecified osteoarthritis, unspecified site: Secondary | ICD-10-CM

## 2020-11-25 NOTE — Telephone Encounter (Signed)
PA came in for name brand mobic - generic was sent over to [facility] 7/28 - per WM picked up and PA not needed.

## 2020-12-16 ENCOUNTER — Ambulatory Visit: Payer: BC Managed Care – PPO | Admitting: Nurse Practitioner

## 2020-12-16 DIAGNOSIS — J0111 Acute recurrent frontal sinusitis: Secondary | ICD-10-CM | POA: Diagnosis not present

## 2020-12-16 MED ORDER — DOXYCYCLINE HYCLATE 100 MG PO TABS: 100.0000 mg | ORAL_TABLET | Freq: Two times a day (BID) | ORAL | 0 refills | Status: DC

## 2020-12-16 NOTE — Assessment & Plan Note (Addendum)
Unresolved symptoms of sinusitis, with cough, headache and congestion.  At home COVID-19 test negative.  Doxycycline 100 mg tablet by mouth twice daily.  Increase hydration, Tylenol for headache. Patient reports she did not complete last antibiotic because she was out of town and wants new prescription to finish the dose because symptoms were better during treatment and got worse after she stopped.  Education provided to patient on the importance of completing antibiotic treatment as prescribed.  Patient verbalized understanding.  Rx sent to pharmacy.  Patient is to follow-up with worsening symptoms.

## 2020-12-16 NOTE — Progress Notes (Signed)
   Virtual Visit  Note Due to COVID-19 pandemic this visit was conducted virtually. This visit type was conducted due to national recommendations for restrictions regarding the COVID-19 Pandemic (e.g. social distancing, sheltering in place) in an effort to limit this patient's exposure and mitigate transmission in our community. All issues noted in this document were discussed and addressed.  A physical exam was not performed with this format.  I connected with Toni Francis on 12/16/20 at 12:20 PM by telephone and verified that I am speaking with the correct person using two identifiers. Toni Francis is currently located in her car during visit. The provider, Ivy Lynn, NP is located in their office at time of visit.  I discussed the limitations, risks, security and privacy concerns of performing an evaluation and management service by telephone and the availability of in person appointments. I also discussed with the patient that there may be a patient responsible charge related to this service. The patient expressed understanding and agreed to proceed.   History and Present Illness:  Sinusitis This is a recurrent problem. The current episode started in the past 7 days. The problem is unchanged. There has been no fever. The pain is moderate. Associated symptoms include congestion, coughing and headaches. Pertinent negatives include no chills. Past treatments include antibiotics. The treatment provided moderate relief.     Review of Systems  Constitutional:  Negative for chills and fever.  HENT:  Positive for congestion.   Respiratory:  Positive for cough.   Skin:  Negative for rash.  Neurological:  Positive for headaches.  All other systems reviewed and are negative.   Observations/Objective: Telephone visit patient not in distress.  Assessment and Plan: Unresolved symptoms of sinusitis, with cough, headache and congestion.  At home COVID-19 test negative.  Doxycycline 100 mg  tablet by mouth twice daily.  Increase hydration, Tylenol for headache.  Follow Up Instructions: Follow-up with unresolved symptoms.    I discussed the assessment and treatment plan with the patient. The patient was provided an opportunity to ask questions and all were answered. The patient agreed with the plan and demonstrated an understanding of the instructions.   The patient was advised to call back or seek an in-person evaluation if the symptoms worsen or if the condition fails to improve as anticipated.  The above assessment and management plan was discussed with the patient. The patient verbalized understanding of and has agreed to the management plan. Patient is aware to call the clinic if symptoms persist or worsen. Patient is aware when to return to the clinic for a follow-up visit. Patient educated on when it is appropriate to go to the emergency department.   Time call ended: 12:30 PM  I provided 10 minutes of  non face-to-face time during this encounter.    Ivy Lynn, NP

## 2021-01-05 ENCOUNTER — Ambulatory Visit (INDEPENDENT_AMBULATORY_CARE_PROVIDER_SITE_OTHER): Payer: BC Managed Care – PPO | Admitting: Otolaryngology

## 2021-01-12 ENCOUNTER — Ambulatory Visit (INDEPENDENT_AMBULATORY_CARE_PROVIDER_SITE_OTHER): Payer: BC Managed Care – PPO | Admitting: Endocrinology

## 2021-01-12 ENCOUNTER — Other Ambulatory Visit: Payer: Self-pay

## 2021-01-12 DIAGNOSIS — E039 Hypothyroidism, unspecified: Secondary | ICD-10-CM | POA: Insufficient documentation

## 2021-01-12 DIAGNOSIS — E890 Postprocedural hypothyroidism: Secondary | ICD-10-CM

## 2021-01-12 MED ORDER — LEVOTHYROXINE SODIUM 25 MCG PO TABS: 25.0000 ug | ORAL_TABLET | Freq: Every day | ORAL | 3 refills | Status: DC

## 2021-01-12 NOTE — Patient Instructions (Addendum)
I have sent a prescription to your pharmacy, for a small amount of the thyroid pill.   Please recheck the blood test in approx 1 month.   Please come back for a follow-up appointment in 6 months.

## 2021-01-12 NOTE — Progress Notes (Signed)
Subjective:    Patient ID: Toni Francis, female    DOB: 12-06-1955, 65 y.o.   MRN: [id number]  HPI Pt is referred by Hendricks Limes, NP, for [facility].  She had right thyroid lobectomy 08/13/2019.  She was rx'ed synthroid, but she stopped, due to sob.  She also has weight gain.   Past Medical History:  Diagnosis Date   Abdominal hernia    Arthritis    GERD (gastroesophageal reflux disease)    Hypertension    Pinched nerve    L4-5,back    Past Surgical History:  Procedure Laterality Date   CHOLECYSTECTOMY     THYROIDECTOMY Right 08/13/2019   Procedure: RIGHT THYROID LOBECTOMY;  Surgeon: Rozetta Nunnery, MD;  Location: [facility] OR;  Service: ENT;  Laterality: Right;    Social History   Socioeconomic History   Marital status: Divorced    Spouse name: Not on file   Number of children: 2   Years of education: 12   Highest education level: Some college, no degree  Occupational History    Employer: [employer]  Tobacco Use   Smoking status: Never   Smokeless tobacco: Never  Vaping Use   Vaping Use: Never used  Substance and Sexual Activity   Alcohol use: No   Drug use: No   Sexual activity: Not on file  Other Topics Concern   Not on file  Social History Narrative   Not on file   Social Determinants of Health   Financial Resource Strain: Not on file  Food Insecurity: Not on file  Transportation Needs: Not on file  Physical Activity: Not on file  Stress: Not on file  Social Connections: Not on file  Intimate Partner Violence: Not on file    Current Outpatient Medications on File Prior to Visit  Medication Sig Dispense Refill   acetaminophen (TYLENOL) 500 MG tablet Take 1,000 mg by mouth daily as needed for moderate pain or headache.     Ascorbic Acid (VITAMIN C) 500 MG CAPS Take by mouth.     Calcium Citrate-Vitamin D (CALCIUM + D PO) Take 1 tablet by mouth daily.     fluticasone (FLONASE) 50 MCG/ACT nasal spray USE 2 SPRAY(S) IN EACH NOSTRIL TWICE DAILY  48 g 0   levocetirizine (XYZAL) 5 MG tablet Take 1 tablet (5 mg total) by mouth every evening. 90 tablet 1   meloxicam (MOBIC) 15 MG tablet Take 1 tablet by mouth once daily 90 tablet 0   Multiple Vitamins-Minerals (CENTRUM SILVER PO) Take 1 tablet by mouth daily.     Multiple Vitamins-Minerals (EYE VITAMINS PO) Take 1 capsule by mouth daily.     Nutritional Supplements (VITAMIN D BOOSTER PO) Take by mouth.     Omega-3 Fatty Acids (FISH OIL PO) Take 1 capsule by mouth daily.      omeprazole (PRILOSEC) 20 MG capsule Take 1 capsule by mouth once daily 90 capsule 1   triamterene-hydrochlorothiazide (MAXZIDE-25) 37.5-25 MG tablet Take 1 tablet by mouth daily. For blood pressure and fluid 90 tablet 3   ZINC OXIDE PO Take by mouth.     No current facility-administered medications on file prior to visit.     Family History  Problem Relation Age of Onset   Colon cancer Maternal Uncle 50    BP 110/60 (BP Location: Right Arm, Patient Position: Sitting, Cuff Size: Large)   Pulse 69   Ht '5\' 7"'  (1.702 m)   Wt 257 lb 9.6 oz (116.8 kg)  SpO2 96%   BMI 40.35 kg/m    Review of Systems denies depression, memory loss, constipation, cold intolerance, and dry skin.   Objective:   Physical Exam VS: see vs page GEN: no distress HEAD: head: no deformity eyes: no periorbital swelling, no proptosis external nose and ears are normal NECK: a healed scar is present.  I do not appreciate a nodule in the thyroid or elsewhere in the neck.   CHEST WALL: no deformity LUNGS: clear to auscultation CV: reg rate and rhythm, no murmur.  MUSCULOSKELETAL: gait is normal and steady EXTEMITIES: no deformity.  no leg edema NEURO:  readily moves all 4's.  sensation is intact to touch on all 4's SKIN:  Normal texture and temperature.  No rash or suspicious lesion is visible.   NODES:  None palpable at the neck.   PSYCH: alert, well-oriented.  Does not appear anxious nor depressed.    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  - Background adenomatous goiter.  - Lymphocytic thyroiditis.  - There is no evidence of carcinoma in 2 of 2 lymph nodes (0/2).  - The surgical resection margins are negative for carcinoma.    I have reviewed outside records, and summarized: Pt was noted to have elevated TSH, and referred here.  HTN, obesity, allergic rhinitis, and GERD were also addressed.     Assessment & Plan:  Hypothyroidism, uncontrolled: therapy is limited by perceived drug intolerance PTC, small focus.  All she needs for f/u is periodic phys exam of the neck  Patient Instructions  I have sent a prescription to your pharmacy, for a small amount of the thyroid pill.   Please recheck the blood test in approx 1 month.   Please come back for a follow-up appointment in 6 months.

## 2021-01-20 ENCOUNTER — Encounter: Payer: Self-pay | Admitting: Family Medicine

## 2021-01-20 ENCOUNTER — Ambulatory Visit (INDEPENDENT_AMBULATORY_CARE_PROVIDER_SITE_OTHER): Payer: BC Managed Care – PPO | Admitting: Family Medicine

## 2021-01-20 DIAGNOSIS — J0140 Acute pansinusitis, unspecified: Secondary | ICD-10-CM | POA: Diagnosis not present

## 2021-01-20 MED ORDER — DOXYCYCLINE HYCLATE 100 MG PO TABS: 100.0000 mg | ORAL_TABLET | Freq: Two times a day (BID) | ORAL | 0 refills | Status: AC

## 2021-01-20 NOTE — Progress Notes (Signed)
Virtual Visit via telephone Note Due to COVID-19 pandemic this visit was conducted virtually. This visit type was conducted due to national recommendations for restrictions regarding the COVID-19 Pandemic (e.g. social distancing, sheltering in place) in an effort to limit this patient's exposure and mitigate transmission in our community. All issues noted in this document were discussed and addressed.  A physical exam was not performed with this format.   I connected with Toni Francis on 01/20/2021 at 1425 by telephone and verified that I am speaking with the correct person using two identifiers. Toni Francis is currently located at home and  no one  is currently with them during visit. The provider, Monia Pouch, FNP is located in their office at time of visit.  I discussed the limitations, risks, security and privacy concerns of performing an evaluation and management service by telephone and the availability of in person appointments. I also discussed with the patient that there may be a patient responsible charge related to this service. The patient expressed understanding and agreed to proceed.  Subjective:  Patient ID: Toni Francis, female    DOB: 1955/11/25, 65 y.o.   MRN: [id number]  Chief Complaint:  Sinusitis   HPI: ROSAISELA JAMROZ is a 65 y.o. female presenting on 01/20/2021 for Sinusitis   Pt reports ongoing headache and sinus pressure for last 12 days. She has been taking Mucinex, tylenol cold, and using Flonase for the last 7 days without relief of symptoms. Has not tested positive for COVID-19.  Sinusitis This is a new problem. The current episode started 1 to 4 weeks ago. The problem has been gradually worsening since onset. The maximum temperature recorded prior to her arrival was 100.4 - 100.9 F. Her pain is at a severity of 5/10. The pain is moderate. Associated symptoms include congestion, coughing, headaches, sinus pressure and a sore throat. Pertinent negatives include no  chills, diaphoresis, ear pain, hoarse voice, neck pain, shortness of breath, sneezing or swollen glands. Past treatments include acetaminophen, oral decongestants and spray decongestants. The treatment provided no relief.    Relevant past medical, surgical, family, and social history reviewed and updated as indicated.  Allergies and medications reviewed and updated.   Past Medical History:  Diagnosis Date   Abdominal hernia    Arthritis    GERD (gastroesophageal reflux disease)    Hypertension    Pinched nerve    L4-5,back    Past Surgical History:  Procedure Laterality Date   CHOLECYSTECTOMY     THYROIDECTOMY Right 08/13/2019   Procedure: RIGHT THYROID LOBECTOMY;  Surgeon: Rozetta Nunnery, MD;  Location: [facility] OR;  Service: ENT;  Laterality: Right;    Social History   Socioeconomic History   Marital status: Divorced    Spouse name: Not on file   Number of children: 2   Years of education: 12   Highest education level: Some college, no degree  Occupational History    Employer: [employer] trader  Tobacco Use   Smoking status: Never   Smokeless tobacco: Never  Vaping Use   Vaping Use: Never used  Substance and Sexual Activity   Alcohol use: No   Drug use: No   Sexual activity: Not on file  Other Topics Concern   Not on file  Social History Narrative   Not on file   Social Determinants of Health   Financial Resource Strain: Not on file  Food Insecurity: Not on file  Transportation Needs: Not on file  Physical  Activity: Not on file  Stress: Not on file  Social Connections: Not on file  Intimate Partner Violence: Not on file    Outpatient Encounter Medications as of 01/20/2021  Medication Sig   acetaminophen (TYLENOL) 500 MG tablet Take 1,000 mg by mouth daily as needed for moderate pain or headache.   Ascorbic Acid (VITAMIN C) 500 MG CAPS Take by mouth.   Calcium Citrate-Vitamin D (CALCIUM + D PO) Take 1 tablet by mouth daily.   doxycycline  (VIBRA-TABS) 100 MG tablet Take 1 tablet (100 mg total) by mouth 2 (two) times daily for 10 days. 1 po bid   fluticasone (FLONASE) 50 MCG/ACT nasal spray USE 2 SPRAY(S) IN EACH NOSTRIL TWICE DAILY   levocetirizine (XYZAL) 5 MG tablet Take 1 tablet (5 mg total) by mouth every evening.   levothyroxine (SYNTHROID) 25 MCG tablet Take 1 tablet (25 mcg total) by mouth daily before breakfast.   meloxicam (MOBIC) 15 MG tablet Take 1 tablet by mouth once daily   Multiple Vitamins-Minerals (CENTRUM SILVER PO) Take 1 tablet by mouth daily.   Multiple Vitamins-Minerals (EYE VITAMINS PO) Take 1 capsule by mouth daily.   Nutritional Supplements (VITAMIN D BOOSTER PO) Take by mouth.   Omega-3 Fatty Acids (FISH OIL PO) Take 1 capsule by mouth daily.    omeprazole (PRILOSEC) 20 MG capsule Take 1 capsule by mouth once daily   triamterene-hydrochlorothiazide (MAXZIDE-25) 37.5-25 MG tablet Take 1 tablet by mouth daily. For blood pressure and fluid   ZINC OXIDE PO Take by mouth.   No facility-administered encounter medications on file as of 01/20/2021.    Allergies  Allergen Reactions   Ampicillin Rash    Did it involve swelling of the face/tongue/throat, SOB, or low BP? No Did it involve sudden or severe rash/hives, skin peeling, or any reaction on the inside of your mouth or nose? Yes Did you need to seek medical attention at a hospital or doctor's office? Yes When did it last happen?      10 + years If all above answers are "NO", may proceed with cephalosporin use.    Codeine Nausea And Vomiting and Other (See Comments)    Also causes headaches    Pseudoephedrine Palpitations    Review of Systems  Constitutional:  Negative for activity change, appetite change, chills, diaphoresis, fatigue, fever and unexpected weight change.  HENT:  Positive for congestion, postnasal drip, rhinorrhea, sinus pressure, sinus pain and sore throat. Negative for dental problem, drooling, ear discharge, ear pain, facial  swelling, hearing loss, hoarse voice, mouth sores, nosebleeds, sneezing, tinnitus, trouble swallowing and voice change.   Eyes: Negative.   Respiratory:  Positive for cough. Negative for chest tightness, shortness of breath, wheezing and stridor.   Cardiovascular:  Negative for chest pain, palpitations and leg swelling.  Gastrointestinal:  Negative for abdominal pain, blood in stool, constipation, diarrhea, nausea and vomiting.  Endocrine: Negative.   Genitourinary:  Negative for decreased urine volume, difficulty urinating, dysuria, frequency and urgency.  Musculoskeletal:  Negative for arthralgias, myalgias and neck pain.  Skin: Negative.   Allergic/Immunologic: Negative.   Neurological:  Positive for headaches. Negative for dizziness, tremors, seizures, syncope, facial asymmetry, speech difficulty, weakness, light-headedness and numbness.  Hematological: Negative.   Psychiatric/Behavioral:  Negative for confusion, hallucinations, sleep disturbance and suicidal ideas.   All other systems reviewed and are negative.       Observations/Objective: No vital signs or physical exam, this was a telephone or virtual health encounter.  Pt alert  and oriented, answers all questions appropriately, and able to speak in full sentences.    Assessment and Plan: Asharia was seen today for sinusitis.  Diagnoses and all orders for this visit:  Acute non-recurrent pansinusitis Reported sinusitis symptom for more than 12 days. Has failed symptomatic care at home. Has not tested positive for COVID-19. Due to ongoing symptoms despite symptomatic care, will initiate antibiotics. Continue symptomatic care. Report any new, worsening, or persistent symptoms.  -     doxycycline (VIBRA-TABS) 100 MG tablet; Take 1 tablet (100 mg total) by mouth 2 (two) times daily for 10 days. 1 po bid     Follow Up Instructions: Return if symptoms worsen or fail to improve.    I discussed the assessment and treatment plan  with the patient. The patient was provided an opportunity to ask questions and all were answered. The patient agreed with the plan and demonstrated an understanding of the instructions.   The patient was advised to call back or seek an in-person evaluation if the symptoms worsen or if the condition fails to improve as anticipated.  The above assessment and management plan was discussed with the patient. The patient verbalized understanding of and has agreed to the management plan. Patient is aware to call the clinic if they develop any new symptoms or if symptoms persist or worsen. Patient is aware when to return to the clinic for a follow-up visit. Patient educated on when it is appropriate to go to the emergency department.    I provided 11 minutes of non-face-to-face time during this encounter. The call started at 1425. The call ended at 1435. The other time was used for coordination of care.    Monia Pouch, FNP-C [facility] [address] [place], [place] [address] [phone] 01/20/2021

## 2021-01-30 ENCOUNTER — Other Ambulatory Visit: Payer: Self-pay | Admitting: Family Medicine

## 2021-01-30 ENCOUNTER — Telehealth: Payer: Self-pay | Admitting: Nurse Practitioner

## 2021-01-30 DIAGNOSIS — J0140 Acute pansinusitis, unspecified: Secondary | ICD-10-CM

## 2021-01-30 MED ORDER — AZITHROMYCIN 250 MG PO TABS: ORAL_TABLET | ORAL | 0 refills | Status: DC

## 2021-01-30 NOTE — Telephone Encounter (Signed)
Pt called to let Dr Thayer Ohm know that she has finished taking the antibiotic that was prescribed to her for a sinus infection on 9/27 and says she does feel better but not 100% better. Says she is still coughing up yellow stuff and needs another antibiotic called in for her to help knock out the sinus infection.  Please advise and call patient.

## 2021-02-06 ENCOUNTER — Ambulatory Visit: Payer: BC Managed Care – PPO | Admitting: Family Medicine

## 2021-02-11 ENCOUNTER — Other Ambulatory Visit: Payer: Self-pay

## 2021-02-11 ENCOUNTER — Other Ambulatory Visit: Payer: Self-pay | Admitting: Endocrinology

## 2021-02-11 ENCOUNTER — Other Ambulatory Visit (INDEPENDENT_AMBULATORY_CARE_PROVIDER_SITE_OTHER): Payer: BC Managed Care – PPO

## 2021-02-11 DIAGNOSIS — E890 Postprocedural hypothyroidism: Secondary | ICD-10-CM | POA: Diagnosis not present

## 2021-02-11 LAB — TSH: TSH: 5.67 u[IU]/mL — ABNORMAL HIGH (ref 0.35–5.50)

## 2021-02-11 LAB — T4, FREE: Free T4: 0.7 ng/dL (ref 0.60–1.60)

## 2021-02-11 MED ORDER — LEVOTHYROXINE SODIUM 50 MCG PO TABS: 50.0000 ug | ORAL_TABLET | Freq: Every day | ORAL | 3 refills | Status: DC

## 2021-02-20 ENCOUNTER — Other Ambulatory Visit: Payer: Self-pay | Admitting: Nurse Practitioner

## 2021-02-20 DIAGNOSIS — M1990 Unspecified osteoarthritis, unspecified site: Secondary | ICD-10-CM

## 2021-03-01 ENCOUNTER — Telehealth: Payer: BC Managed Care – PPO | Admitting: Nurse Practitioner

## 2021-03-01 DIAGNOSIS — B9689 Other specified bacterial agents as the cause of diseases classified elsewhere: Secondary | ICD-10-CM | POA: Diagnosis not present

## 2021-03-01 DIAGNOSIS — J019 Acute sinusitis, unspecified: Secondary | ICD-10-CM | POA: Diagnosis not present

## 2021-03-01 MED ORDER — DOXYCYCLINE HYCLATE 100 MG PO TABS: 100.0000 mg | ORAL_TABLET | Freq: Two times a day (BID) | ORAL | 0 refills | Status: AC

## 2021-03-01 NOTE — Progress Notes (Signed)

## 2021-03-26 ENCOUNTER — Encounter: Payer: Self-pay | Admitting: Nurse Practitioner

## 2021-03-26 ENCOUNTER — Ambulatory Visit: Payer: BC Managed Care – PPO | Admitting: Nurse Practitioner

## 2021-03-26 VITALS — BP 156/97 | HR 85 | Temp 97.8°F | Resp 20 | Ht 67.0 in | Wt 268.0 lb

## 2021-03-26 DIAGNOSIS — B079 Viral wart, unspecified: Secondary | ICD-10-CM | POA: Diagnosis not present

## 2021-03-26 DIAGNOSIS — M5441 Lumbago with sciatica, right side: Secondary | ICD-10-CM | POA: Diagnosis not present

## 2021-03-26 MED ORDER — METHYLPREDNISOLONE ACETATE 80 MG/ML IJ SUSP
80.0000 mg | Freq: Once | INTRAMUSCULAR | Status: AC
  Administered 2021-03-26: 80 mg via INTRAMUSCULAR

## 2021-03-26 MED ORDER — PREDNISONE 20 MG PO TABS: ORAL_TABLET | ORAL | 0 refills | Status: DC
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
## 2021-04-06 ENCOUNTER — Other Ambulatory Visit: Payer: Self-pay | Admitting: Nurse Practitioner

## 2021-04-06 DIAGNOSIS — J301 Allergic rhinitis due to pollen: Secondary | ICD-10-CM

## 2021-04-10 ENCOUNTER — Ambulatory Visit: Payer: BC Managed Care – PPO | Admitting: Nurse Practitioner

## 2021-04-10 ENCOUNTER — Encounter: Payer: Self-pay | Admitting: Nurse Practitioner

## 2021-04-10 VITALS — BP 128/82 | HR 89 | Temp 97.3°F | Resp 20 | Ht 67.0 in | Wt 247.0 lb

## 2021-04-10 DIAGNOSIS — K219 Gastro-esophageal reflux disease without esophagitis: Secondary | ICD-10-CM | POA: Diagnosis not present

## 2021-04-10 DIAGNOSIS — M25512 Pain in left shoulder: Secondary | ICD-10-CM | POA: Diagnosis not present

## 2021-04-10 DIAGNOSIS — I10 Essential (primary) hypertension: Secondary | ICD-10-CM | POA: Diagnosis not present

## 2021-04-10 DIAGNOSIS — E890 Postprocedural hypothyroidism: Secondary | ICD-10-CM | POA: Diagnosis not present

## 2021-04-10 DIAGNOSIS — Z85850 Personal history of malignant neoplasm of thyroid: Secondary | ICD-10-CM | POA: Diagnosis not present

## 2021-04-10 MED ORDER — METHYLPREDNISOLONE ACETATE 80 MG/ML IJ SUSP
80.0000 mg | Freq: Once | INTRAMUSCULAR | Status: AC
  Administered 2021-04-10: 80 mg via INTRAMUSCULAR

## 2021-04-10 MED ORDER — KETOROLAC TROMETHAMINE 60 MG/2ML IM SOLN
60.0000 mg | Freq: Once | INTRAMUSCULAR | Status: AC
Start: 2021-04-10 — End: 2021-04-10
  Administered 2021-04-10: 60 mg via INTRAMUSCULAR

## 2021-04-10 NOTE — Patient Instructions (Signed)
Shoulder Pain Many things can cause shoulder pain, including: An injury. Moving the shoulder in the same way again and again (overuse). Joint pain (arthritis). Pain can come from: Swelling and irritation (inflammation) of any part of the shoulder. An injury to the shoulder joint. An injury to: Tissues that connect muscle to bone (tendons). Tissues that connect bones to each other (ligaments). Bones. Follow these instructions at home: Watch for changes in your symptoms. Let your doctor know about them. Follow these instructions to help with your pain. If you have a sling: Wear the sling as told by your doctor. Remove it only as told by your doctor. Loosen the sling if your fingers: Tingle. Become numb. Turn cold and blue. Keep the sling clean. If the sling is not waterproof: Do not let it get wet. Take the sling off when you shower or bathe. Managing pain, stiffness, and swelling  If told, put ice on the painful area: Put ice in a plastic bag. Place a towel between your skin and the bag. Leave the ice on for 20 minutes, 2-3 times a day. Stop putting ice on if it does not help with the pain. Squeeze a soft ball or a foam pad as much as possible. This prevents swelling in the shoulder. It also helps to strengthen the arm. General instructions Take over-the-counter and prescription medicines only as told by your doctor. Keep all follow-up visits as told by your doctor. This is important. Contact a doctor if: Your pain gets worse. Medicine does not help your pain. You have new pain in your arm, hand, or fingers. Get help right away if: Your arm, hand, or fingers: Tingle. Are numb. Are swollen. Are painful. Turn white or blue. Summary Shoulder pain can be caused by many things. These include injury, moving the shoulder in the same away again and again, and joint pain. Watch for changes in your symptoms. Let your doctor know about them. This condition may be treated with a  sling, ice, and pain medicine. Contact your doctor if the pain gets worse or you have new pain. Get help right away if your arm, hand, or fingers tingle or get numb, swollen, or painful. Keep all follow-up visits as told by your doctor. This is important. This information is not intended to replace advice given to you by your health care provider. Make sure you discuss any questions you have with your health care provider. Document Revised: 12/26/2020 Document Reviewed: 12/26/2020 Elsevier Patient Education  2022 [employer].
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
## 2021-04-10 NOTE — Addendum Note (Signed)
Addended by: Zannie Cove on: 04/10/2021 11:11 AM   Modules accepted: Orders

## 2021-04-16 ENCOUNTER — Other Ambulatory Visit: Payer: BC Managed Care – PPO

## 2021-04-16 DIAGNOSIS — I10 Essential (primary) hypertension: Secondary | ICD-10-CM

## 2021-04-16 DIAGNOSIS — E890 Postprocedural hypothyroidism: Secondary | ICD-10-CM

## 2021-04-17 LAB — CMP14+EGFR
ALT: 16 IU/L (ref 0–32)
AST: 19 IU/L (ref 0–40)
Albumin/Globulin Ratio: 2.4 — ABNORMAL HIGH (ref 1.2–2.2)
Albumin: 3.9 g/dL (ref 3.8–4.8)
Alkaline Phosphatase: 78 IU/L (ref 44–121)
BUN/Creatinine Ratio: 27 (ref 12–28)
BUN: 26 mg/dL (ref 8–27)
Bilirubin Total: 0.4 mg/dL (ref 0.0–1.2)
CO2: 25 mmol/L (ref 20–29)
Calcium: 9 mg/dL (ref 8.7–10.3)
Chloride: 102 mmol/L (ref 96–106)
Creatinine, Ser: 0.95 mg/dL (ref 0.57–1.00)
Globulin, Total: 1.6 g/dL (ref 1.5–4.5)
Glucose: 86 mg/dL (ref 70–99)
Potassium: 4.3 mmol/L (ref 3.5–5.2)
Sodium: 142 mmol/L (ref 134–144)
Total Protein: 5.5 g/dL — ABNORMAL LOW (ref 6.0–8.5)
eGFR: 67 mL/min/{1.73_m2} (ref >59)

## 2021-04-17 LAB — CBC WITH DIFFERENTIAL/PLATELET
Basophils Absolute: 0.1 10*3/uL (ref 0.0–0.2)
Basos: 1 %
EOS (ABSOLUTE): 0.1 10*3/uL (ref 0.0–0.4)
Eos: 1 %
Hematocrit: 42.4 % (ref 34.0–46.6)
Hemoglobin: 14.3 g/dL (ref 11.1–15.9)
Immature Grans (Abs): 0 10*3/uL (ref 0.0–0.1)
Immature Granulocytes: 0 %
Lymphocytes Absolute: 1.8 10*3/uL (ref 0.7–3.1)
Lymphs: 19 %
MCH: 27.7 pg (ref 26.6–33.0)
MCHC: 33.7 g/dL (ref 31.5–35.7)
MCV: 82 fL (ref 79–97)
Monocytes Absolute: 0.8 10*3/uL (ref 0.1–0.9)
Monocytes: 9 %
Neutrophils Absolute: 6.7 10*3/uL (ref 1.4–7.0)
Neutrophils: 70 %
Platelets: 287 10*3/uL (ref 150–450)
RBC: 5.16 x10E6/uL (ref 3.77–5.28)
RDW: 12.7 % (ref 11.7–15.4)
WBC: 9.6 10*3/uL (ref 3.4–10.8)

## 2021-04-17 LAB — THYROID PANEL WITH TSH
Free Thyroxine Index: 2 (ref 1.2–4.9)
T3 Uptake Ratio: 27 % (ref 24–39)
T4, Total: 7.4 ug/dL (ref 4.5–12.0)
TSH: 6 u[IU]/mL — ABNORMAL HIGH (ref 0.450–4.500)

## 2021-04-17 LAB — LIPID PANEL
Chol/HDL Ratio: 2.7 ratio (ref 0.0–4.4)
Cholesterol, Total: 136 mg/dL (ref 100–199)
HDL: 51 mg/dL (ref >39)
LDL Chol Calc (NIH): 62 mg/dL (ref 0–99)
Triglycerides: 131 mg/dL (ref 0–149)
VLDL Cholesterol Cal: 23 mg/dL (ref 5–40)

## 2021-04-17 MED ORDER — LEVOTHYROXINE SODIUM 75 MCG PO TABS: 75.0000 ug | ORAL_TABLET | Freq: Every day | ORAL | 3 refills | Status: DC

## 2021-05-23 ENCOUNTER — Other Ambulatory Visit: Payer: Self-pay | Admitting: Nurse Practitioner

## 2021-05-23 DIAGNOSIS — K219 Gastro-esophageal reflux disease without esophagitis: Secondary | ICD-10-CM

## 2021-07-14 ENCOUNTER — Encounter: Payer: Self-pay | Admitting: Endocrinology

## 2021-07-14 ENCOUNTER — Telehealth: Payer: Self-pay | Admitting: Nurse Practitioner

## 2021-07-14 ENCOUNTER — Other Ambulatory Visit: Payer: Self-pay

## 2021-07-14 ENCOUNTER — Ambulatory Visit (INDEPENDENT_AMBULATORY_CARE_PROVIDER_SITE_OTHER): Payer: BC Managed Care – PPO | Admitting: Nurse Practitioner

## 2021-07-14 ENCOUNTER — Ambulatory Visit: Payer: BC Managed Care – PPO | Admitting: Endocrinology

## 2021-07-14 ENCOUNTER — Encounter: Payer: Self-pay | Admitting: Nurse Practitioner

## 2021-07-14 VITALS — BP 132/86 | HR 84 | Ht 67.0 in | Wt 273.0 lb

## 2021-07-14 DIAGNOSIS — J0100 Acute maxillary sinusitis, unspecified: Secondary | ICD-10-CM | POA: Diagnosis not present

## 2021-07-14 DIAGNOSIS — Z85850 Personal history of malignant neoplasm of thyroid: Secondary | ICD-10-CM | POA: Diagnosis not present

## 2021-07-14 LAB — T4, FREE: Free T4: 0.92 ng/dL (ref 0.60–1.60)

## 2021-07-14 LAB — TSH: TSH: 5.16 u[IU]/mL (ref 0.35–5.50)

## 2021-07-14 MED ORDER — DOXYCYCLINE HYCLATE 100 MG PO TABS: 100.0000 mg | ORAL_TABLET | Freq: Two times a day (BID) | ORAL | 0 refills | Status: DC

## 2021-07-14 NOTE — Progress Notes (Signed)
? ?Virtual Visit Consent  ? ?Toni Francis, you are scheduled for a virtual visit with Toni Francis Done, [place], a [facility] provider, today.   ?  ?Just as with appointments in the office, your consent must be obtained to participate.  Your consent will be active for this visit and any virtual visit you may have with one of our providers in the next 365 days.   ?  ?If you have a MyChart account, a copy of this consent can be sent to you electronically.  All virtual visits are billed to your insurance company just like a traditional visit in the office.   ? ?As this is a virtual visit, video technology does not allow for your provider to perform a traditional examination.  This may limit your provider's ability to fully assess your condition.  If your provider identifies any concerns that need to be evaluated in person or the need to arrange testing (such as labs, EKG, etc.), we will make arrangements to do so.   ?  ?Although advances in technology are sophisticated, we cannot ensure that it will always work on either your end or our end.  If the connection with a video visit is poor, the visit may have to be switched to a telephone visit.  With either a video or telephone visit, we are not always able to ensure that we have a secure connection.    ? ?I need to obtain your verbal consent now.   Are you willing to proceed with your visit today? YES ?  ?KELIA GIBBON has provided verbal consent on 07/14/2021 for a virtual visit (video or telephone). ?  ?Toni Francis Done, FNP  ? ?Date: 07/14/2021 4:08 PM ? ? ?Virtual Visit via Video Note  ? ?I, Toni Francis Done, connected with Toni Francis ([id number], 1955-06-07) on 07/14/21 at  3:45 PM EDT by a video-enabled telemedicine application and verified that I am speaking with the correct person using two identifiers. ? ?Location: ?Patient: Virtual Visit Location Patient: Home ?Provider: Virtual Visit Location Provider: Mobile ?  ?I discussed the limitations of  evaluation and management by telemedicine and the availability of in person appointments. The patient expressed understanding and agreed to proceed.   ? ?History of Present Illness: ?Toni Francis is a 66 y.o. who identifies as a female who was assigned female at birth, and is being seen today for sinusitis. ? ?HPI: Sinusitis ?This is a new problem. The current episode started 1 to 4 weeks ago. The problem has been waxing and waning since onset. The maximum temperature recorded prior to her arrival was 100.4 - 100.9 F. The fever has been present for 1 to 2 days. Her pain is at a severity of 6/10. The pain is moderate. Associated symptoms include congestion, coughing and sinus pressure. Pertinent negatives include no sore throat. Past treatments include oral decongestants. The treatment provided mild relief.   ?Review of Systems  ?HENT:  Positive for congestion and sinus pressure. Negative for sore throat.   ?Respiratory:  Positive for cough.   ? ?Problems:  ?Patient Active Problem List  ? Diagnosis Date Noted  ? Hypothyroidism 01/12/2021  ? GERD (gastroesophageal reflux disease) 10/08/2020  ? Morbid obesity ([place]) 03/06/2020  ? Hypertension   ? History of thyroid cancer 08/13/2019  ? Allergic rhinitis 10/14/2015  ?  ?Allergies:  ?Allergies  ?Allergen Reactions  ? Ampicillin Rash  ?  Did it involve swelling of the face/tongue/throat, SOB, or low BP? No ?Did it involve  sudden or severe rash/hives, skin peeling, or any reaction on the inside of your mouth or nose? Yes ?Did you need to seek medical attention at a hospital or doctor's office? Yes ?When did it last happen?      10 + years ?If all above answers are ?NO?, may proceed with cephalosporin use. ?  ? Codeine Nausea And Vomiting and Other (See Comments)  ?  Also causes headaches ?  ? Pseudoephedrine Palpitations  ? ?Medications:  ?Current Outpatient Medications:  ?  acetaminophen (TYLENOL) 500 MG tablet, Take 1,000 mg by mouth daily as needed for moderate pain or  headache., Disp: , Rfl:  ?  Ascorbic Acid (VITAMIN C) 500 MG CAPS, Take by mouth., Disp: , Rfl:  ?  Calcium Citrate-Vitamin D (CALCIUM + D PO), Take 1 tablet by mouth daily., Disp: , Rfl:  ?  fluticasone (FLONASE) 50 MCG/ACT nasal spray, USE 2 SPRAY(S) IN EACH NOSTRIL TWICE DAILY, Disp: 48 g, Rfl: 1 ?  levocetirizine (XYZAL) 5 MG tablet, Take 1 tablet (5 mg total) by mouth every evening., Disp: 90 tablet, Rfl: 1 ?  levothyroxine (SYNTHROID) 75 MCG tablet, Take 1 tablet (75 mcg total) by mouth daily., Disp: 90 tablet, Rfl: 3 ?  meloxicam (MOBIC) 15 MG tablet, Take 1 tablet by mouth once daily, Disp: 90 tablet, Rfl: 0 ?  Multiple Vitamins-Minerals (CENTRUM SILVER PO), Take 1 tablet by mouth daily., Disp: , Rfl:  ?  Multiple Vitamins-Minerals (EYE VITAMINS PO), Take 1 capsule by mouth daily., Disp: , Rfl:  ?  Nutritional Supplements (VITAMIN D BOOSTER PO), Take by mouth., Disp: , Rfl:  ?  Omega-3 Fatty Acids (FISH OIL PO), Take 1 capsule by mouth daily. , Disp: , Rfl:  ?  omeprazole (PRILOSEC) 20 MG capsule, Take 1 capsule by mouth once daily, Disp: 90 capsule, Rfl: 1 ?  triamterene-hydrochlorothiazide (MAXZIDE-25) 37.5-25 MG tablet, Take 1 tablet by mouth daily. For blood pressure and fluid, Disp: 90 tablet, Rfl: 3 ?  ZINC OXIDE PO, Take by mouth., Disp: , Rfl:  ? ?Observations/Objective: ?Patient is well-developed, well-nourished in no acute distress.  ?Resting comfortably  at home.  ?Head is normocephalic, atraumatic.  ?No labored breathing.  ?Speech is clear and coherent with logical content.  ?Patient is alert and oriented at baseline.  ?Raspy voice ?Slight cough noted ?Clearing throat alot ? ?Assessment and Plan: ? ?Toni Francis in today with chief complaint of Sinusitis ? ? ?1. Acute non-recurrent maxillary sinusitis ?1. Take meds as prescribed ?2. Use a cool mist humidifier especially during the winter months and when heat has been humid. ?3. Use saline nose sprays frequently ?4. Saline irrigations of the nose  can be very helpful if done frequently. ? * 4X daily for 1 week* ? * Use of a nettie pot can be helpful with this. Follow directions with this* ?5. Drink plenty of fluids ?6. Keep thermostat turn down low ?7.For any cough or congestion- mucinex OTC ?8. For fever or aces or pains- take tylenol or ibuprofen appropriate for age and weight. ? * for fevers greater than 101 orally you may alternate ibuprofen and tylenol every  3 hours. ?  ?Meds ordered this encounter  ?Medications  ? doxycycline (VIBRA-TABS) 100 MG tablet  ?  Sig: Take 1 tablet (100 mg total) by mouth 2 (two) times daily. 1 po bid  ?  Dispense:  20 tablet  ?  Refill:  0  ?  Order Specific Question:   Supervising Provider  ?  Answer:   Caryl Pina A [[id number]]  ? ? ? ? ?Follow Up Instructions: ?I discussed the assessment and treatment plan with the patient. The patient was provided an opportunity to ask questions and all were answered. The patient agreed with the plan and demonstrated an understanding of the instructions.  A copy of instructions were sent to the patient via MyChart. ? ?The patient was advised to call back or seek an in-person evaluation if the symptoms worsen or if the condition fails to improve as anticipated. ? ?Time:  ?I spent 8 minutes with the patient via telehealth technology discussing the above problems/concerns.   ? ?Toni Francis Done, FNP ? ?

## 2021-07-14 NOTE — Progress Notes (Signed)
? ?Subjective:  ? ? Patient ID: Toni Francis, female    DOB: 1955-06-02, 66 y.o.   MRN: [id number] ? ?HPI ?Pt returns for f/u of PTC.  She had right lobectomy in 2021; path showed 6 mm PTC).  She wants to do the [place] after 08/22/21, due to getting medicare.  Synthroid was increased 12/22.   ?Past Medical History:  ?Diagnosis Date  ? Abdominal hernia   ? Arthritis   ? GERD (gastroesophageal reflux disease)   ? Hypertension   ? Pinched nerve   ? L4-5,back  ? ? ?Past Surgical History:  ?Procedure Laterality Date  ? CHOLECYSTECTOMY    ? THYROIDECTOMY Right 08/13/2019  ? Procedure: RIGHT THYROID LOBECTOMY;  Surgeon: Rozetta Nunnery, MD;  Location: [facility];  Service: ENT;  Laterality: Right;  ? ? ?Social History  ? ?Socioeconomic History  ? Marital status: Divorced  ?  Spouse name: Not on file  ? Number of children: 2  ? Years of education: 69  ? Highest education level: Some college, no degree  ?Occupational History  ?  Employer: [employer]  ?Tobacco Use  ? Smoking status: Never  ? Smokeless tobacco: Never  ?Vaping Use  ? Vaping Use: Never used  ?Substance and Sexual Activity  ? Alcohol use: No  ? Drug use: No  ? Sexual activity: Not on file  ?Other Topics Concern  ? Not on file  ?Social History Narrative  ? Not on file  ? ?Social Determinants of Health  ? ?Financial Resource Strain: Not on file  ?Food Insecurity: Not on file  ?Transportation Needs: Not on file  ?Physical Activity: Not on file  ?Stress: Not on file  ?Social Connections: Not on file  ?Intimate Partner Violence: Not on file  ? ? ?Current Outpatient Medications on File Prior to Visit  ?Medication Sig Dispense Refill  ? acetaminophen (TYLENOL) 500 MG tablet Take 1,000 mg by mouth daily as needed for moderate pain or headache.    ? Ascorbic Acid (VITAMIN C) 500 MG CAPS Take by mouth.    ? Calcium Citrate-Vitamin D (CALCIUM + D PO) Take 1 tablet by mouth daily.    ? fluticasone (FLONASE) 50 MCG/ACT nasal spray USE 2 SPRAY(S) IN EACH NOSTRIL TWICE  DAILY 48 g 1  ? levocetirizine (XYZAL) 5 MG tablet Take 1 tablet (5 mg total) by mouth every evening. 90 tablet 1  ? levothyroxine (SYNTHROID) 75 MCG tablet Take 1 tablet (75 mcg total) by mouth daily. 90 tablet 3  ? meloxicam (MOBIC) 15 MG tablet Take 1 tablet by mouth once daily 90 tablet 0  ? Multiple Vitamins-Minerals (CENTRUM SILVER PO) Take 1 tablet by mouth daily.    ? Multiple Vitamins-Minerals (EYE VITAMINS PO) Take 1 capsule by mouth daily.    ? Nutritional Supplements (VITAMIN D BOOSTER PO) Take by mouth.    ? Omega-3 Fatty Acids (FISH OIL PO) Take 1 capsule by mouth daily.     ? omeprazole (PRILOSEC) 20 MG capsule Take 1 capsule by mouth once daily 90 capsule 1  ? triamterene-hydrochlorothiazide (MAXZIDE-25) 37.5-25 MG tablet Take 1 tablet by mouth daily. For blood pressure and fluid 90 tablet 3  ? ZINC OXIDE PO Take by mouth.    ? ?No current facility-administered medications on file prior to visit.  ? ? ?Allergies  ?Allergen Reactions  ? Ampicillin Rash  ?  Did it involve swelling of the face/tongue/throat, SOB, or low BP? No ?Did it involve sudden or severe rash/hives,  skin peeling, or any reaction on the inside of your mouth or nose? Yes ?Did you need to seek medical attention at a hospital or doctor's office? Yes ?When did it last happen?      10 + years ?If all above answers are ?NO?, may proceed with cephalosporin use. ?  ? Codeine Nausea And Vomiting and Other (See Comments)  ?  Also causes headaches ?  ? Pseudoephedrine Palpitations  ? ? ?Family History  ?Problem Relation Age of Onset  ? Colon cancer Maternal Uncle 1  ? ? ?BP 132/86 (BP Location: Left Arm, Patient Position: Sitting, Cuff Size: Normal)   Pulse 84   Ht 5' 7" (1.702 m)   Wt 273 lb (123.8 kg)   SpO2 97%   BMI 42.76 kg/m?  ? ?Review of Systems ? ?   ?Objective:  ? Physical Exam ?VITAL SIGNS:  See vs page ?GENERAL: no distress ?Neck: a healed scar is present.  I do not appreciate a nodule in the thyroid or elsewhere in the neck.    ? ?Lab Results  ?Component Value Date  ? TSH 5.16 07/14/2021  ? T4TOTAL 7.4 04/16/2021  ? ?   ?Assessment & Plan:  ?Hypothyroidism: borderline control.  I offered pt the option of increasing rx vs continuing the same.   ?PTC, small focus.  Recheck [place]. ? ?

## 2021-07-14 NOTE — Telephone Encounter (Signed)
Telephone visit made for today. Patient notified and verbalized understanding ?

## 2021-07-14 NOTE — Patient Instructions (Signed)
Let's recheck the ultrasound.  you will receive a phone call, about a day and time for an appointment. Blood tests are requested for you today.  We'll let you know about the results.  Please come back for a follow-up appointment in 6 months.   

## 2021-07-14 NOTE — Patient Instructions (Signed)

## 2021-07-30 ENCOUNTER — Ambulatory Visit (INDEPENDENT_AMBULATORY_CARE_PROVIDER_SITE_OTHER): Payer: Medicare HMO | Admitting: Family Medicine

## 2021-07-30 ENCOUNTER — Encounter: Payer: Self-pay | Admitting: Family Medicine

## 2021-07-30 VITALS — BP 116/72 | HR 82 | Temp 97.4°F | Ht 67.0 in | Wt 267.1 lb

## 2021-07-30 DIAGNOSIS — J0101 Acute recurrent maxillary sinusitis: Secondary | ICD-10-CM

## 2021-07-30 MED ORDER — CEFDINIR 300 MG PO CAPS: 300.0000 mg | ORAL_CAPSULE | Freq: Two times a day (BID) | ORAL | 0 refills | Status: AC

## 2021-07-30 MED ORDER — PREDNISONE 10 MG (21) PO TBPK: ORAL_TABLET | ORAL | 0 refills | Status: DC

## 2021-07-30 NOTE — Progress Notes (Signed)
? ?Acute Office Visit ? ?Subjective:  ? ? Patient ID: Toni Francis, female    DOB: 01-Mar-1956, 66 y.o.   MRN: [id number] ? ?Chief Complaint  ?Patient presents with  ? Sinusitis  ? ? ?HPI ?Patient is in today for congestion, sinus pressure under her eyes, and watery eyes for the last 2-3 weeks. Reports that symptoms have been waxing and waning but worsened about 2 days ago.  She reports that that her temperatue was 102 last night with chills. She has been taking xyzal and flonase daily. She has taken tylenol for her fever. Finished doxycylcine about 1 week ago. She has a history of sinus infections requiring abx for treatment. She denies body aches, chest pain, shortness of breath, nausea, vomiting, or diarrhea.  ? ?Past Medical History:  ?Diagnosis Date  ? Abdominal hernia   ? Arthritis   ? GERD (gastroesophageal reflux disease)   ? Hypertension   ? Pinched nerve   ? L4-5,back  ? ? ?Past Surgical History:  ?Procedure Laterality Date  ? CHOLECYSTECTOMY    ? THYROIDECTOMY Right 08/13/2019  ? Procedure: RIGHT THYROID LOBECTOMY;  Surgeon: Rozetta Nunnery, MD;  Location: [facility];  Service: ENT;  Laterality: Right;  ? ? ?Family History  ?Problem Relation Age of Onset  ? Colon cancer Maternal Uncle 62  ? ? ?Social History  ? ?Socioeconomic History  ? Marital status: Divorced  ?  Spouse name: Not on file  ? Number of children: 2  ? Years of education: 23  ? Highest education level: Some college, no degree  ?Occupational History  ?  Employer: [employer]  ?Tobacco Use  ? Smoking status: Never  ? Smokeless tobacco: Never  ?Vaping Use  ? Vaping Use: Never used  ?Substance and Sexual Activity  ? Alcohol use: No  ? Drug use: No  ? Sexual activity: Not on file  ?Other Topics Concern  ? Not on file  ?Social History Narrative  ? Not on file  ? ?Social Determinants of Health  ? ?Financial Resource Strain: Not on file  ?Food Insecurity: Not on file  ?Transportation Needs: Not on file  ?Physical Activity: Not on file   ?Stress: Not on file  ?Social Connections: Not on file  ?Intimate Partner Violence: Not on file  ? ? ?Outpatient Medications Prior to Visit  ?Medication Sig Dispense Refill  ? acetaminophen (TYLENOL) 500 MG tablet Take 1,000 mg by mouth daily as needed for moderate pain or headache.    ? Ascorbic Acid (VITAMIN C) 500 MG CAPS Take by mouth.    ? Calcium Citrate-Vitamin D (CALCIUM + D PO) Take 1 tablet by mouth daily.    ? fluticasone (FLONASE) 50 MCG/ACT nasal spray USE 2 SPRAY(S) IN EACH NOSTRIL TWICE DAILY 48 g 1  ? levocetirizine (XYZAL) 5 MG tablet Take 1 tablet (5 mg total) by mouth every evening. 90 tablet 1  ? levothyroxine (SYNTHROID) 75 MCG tablet Take 1 tablet (75 mcg total) by mouth daily. 90 tablet 3  ? meloxicam (MOBIC) 15 MG tablet Take 1 tablet by mouth once daily 90 tablet 0  ? Nutritional Supplements (VITAMIN D BOOSTER PO) Take by mouth.    ? Omega-3 Fatty Acids (FISH OIL PO) Take 1 capsule by mouth daily.     ? omeprazole (PRILOSEC) 20 MG capsule Take 1 capsule by mouth once daily 90 capsule 1  ? triamterene-hydrochlorothiazide (MAXZIDE-25) 37.5-25 MG tablet Take 1 tablet by mouth daily. For blood pressure and fluid 90  tablet 3  ? ZINC OXIDE PO Take by mouth.    ? Multiple Vitamins-Minerals (CENTRUM SILVER PO) Take 1 tablet by mouth daily. (Patient not taking: Reported on 07/30/2021)    ? Multiple Vitamins-Minerals (EYE VITAMINS PO) Take 1 capsule by mouth daily. (Patient not taking: Reported on 07/30/2021)    ? doxycycline (VIBRA-TABS) 100 MG tablet Take 1 tablet (100 mg total) by mouth 2 (two) times daily. 1 po bid 20 tablet 0  ? ?No facility-administered medications prior to visit.  ? ? ?Allergies  ?Allergen Reactions  ? Ampicillin Rash  ?  Did it involve swelling of the face/tongue/throat, SOB, or low BP? No ?Did it involve sudden or severe rash/hives, skin peeling, or any reaction on the inside of your mouth or nose? Yes ?Did you need to seek medical attention at a hospital or doctor's office?  Yes ?When did it last happen?      10 + years ?If all above answers are ?NO?, may proceed with cephalosporin use. ?  ? Codeine Nausea And Vomiting and Other (See Comments)  ?  Also causes headaches ?  ? Pseudoephedrine Palpitations  ? ? ?Review of Systems ?As per HPI.  ?   ?Objective:  ?  ?Physical Exam ?Vitals and nursing note reviewed.  ?Constitutional:   ?   General: She is not in acute distress. ?   Appearance: She is not toxic-appearing or diaphoretic.  ?HENT:  ?   Right Ear: Ear canal and external ear normal. A middle ear effusion is present. Tympanic membrane is not perforated, erythematous, retracted or bulging.  ?   Left Ear: Ear canal and external ear normal. A middle ear effusion is present. Tympanic membrane is not perforated, erythematous, retracted or bulging.  ?   Nose: Congestion present.  ?   Right Sinus: Maxillary sinus tenderness present.  ?   Left Sinus: Maxillary sinus tenderness present.  ?   Mouth/Throat:  ?   Mouth: Mucous membranes are dry.  ?   Pharynx: Oropharynx is clear.  ?   Tonsils: No tonsillar exudate or tonsillar abscesses. 1+ on the right. 1+ on the left.  ?Cardiovascular:  ?   Rate and Rhythm: Normal rate and regular rhythm.  ?   Heart sounds: Normal heart sounds. No murmur heard. ?Pulmonary:  ?   Effort: Pulmonary effort is normal. No respiratory distress.  ?   Breath sounds: Normal breath sounds.  ?Musculoskeletal:  ?   Right lower leg: No edema.  ?   Left lower leg: No edema.  ?Skin: ?   General: Skin is warm and dry.  ?Neurological:  ?   Mental Status: She is alert and oriented to person, place, and time.  ?Psychiatric:     ?   Mood and Affect: Mood normal.     ?   Behavior: Behavior normal.  ? ? ?BP 116/72   Pulse 82   Temp (!) 97.4 ?F (36.3 ?C) (Temporal)   Ht _0  (1.702 m)   Wt 267 lb 2 oz (121.2 kg)   SpO2 98%   BMI 41.84 kg/m?  ?Wt Readings from Last 3 Encounters:  ?07/30/21 267 lb 2 oz (121.2 kg)  ?07/14/21 273 lb (123.8 kg)  ?04/10/21 247 lb (112 kg)   ? ? ?Health Maintenance Due  ?Topic Date Due  ? Zoster Vaccines- Shingrix (1 of 2) Never done  ? MAMMOGRAM  04/16/2017  ? PAP SMEAR-Modifier  04/26/2017  ? COVID-19 Vaccine (3 - Pfizer risk series) 08/11/2019  ? ? ?  There are no preventive care reminders to display for this patient. ? ? ?Lab Results  ?Component Value Date  ? TSH 5.16 07/14/2021  ? ?Lab Results  ?Component Value Date  ? WBC 9.6 04/16/2021  ? HGB 14.3 04/16/2021  ? HCT 42.4 04/16/2021  ? MCV 82 04/16/2021  ? PLT 287 04/16/2021  ? ?Lab Results  ?Component Value Date  ? NA 142 04/16/2021  ? K 4.3 04/16/2021  ? CO2 25 04/16/2021  ? GLUCOSE 86 04/16/2021  ? BUN 26 04/16/2021  ? CREATININE 0.95 04/16/2021  ? BILITOT 0.4 04/16/2021  ? ALKPHOS 78 04/16/2021  ? AST 19 04/16/2021  ? ALT 16 04/16/2021  ? PROT 5.5 (L) 04/16/2021  ? ALBUMIN 3.9 04/16/2021  ? CALCIUM 9.0 04/16/2021  ? ANIONGAP 13 01/29/2020  ? EGFR 67 04/16/2021  ? ?Lab Results  ?Component Value Date  ? CHOL 136 04/16/2021  ? ?Lab Results  ?Component Value Date  ? HDL 51 04/16/2021  ? ?Lab Results  ?Component Value Date  ? [place] 62 04/16/2021  ? ?Lab Results  ?Component Value Date  ? TRIG 131 04/16/2021  ? ?Lab Results  ?Component Value Date  ? CHOLHDL 2.7 04/16/2021  ? ?No results found for: HGBA1C ? ?   ?Assessment & Plan:  ? ?Brittney was seen today for sinusitis. ? ?Diagnoses and all orders for this visit: ? ?Acute recurrent maxillary sinusitis ?Declined Covid test. Omnicef as below and prednisone taper. Continue allergy medications. Tylenol for fever/pain. Mucinex for cough/congestion.Push fluids.  ?-     cefdinir (OMNICEF) 300 MG capsule; Take 1 capsule (300 mg total) by mouth 2 (two) times daily for 5 days. 1 po BID ?-     predniSONE (STERAPRED UNI-PAK 21 TAB) 10 MG (21) TBPK tablet; Use as directed on back of pill pack ? ?Return to office for new or worsening symptoms, or if symptoms persist.  ? ?The patient indicates understanding of these issues and agrees with the plan. ? ?Gwenlyn Perking, FNP ? ?

## 2021-07-30 NOTE — Patient Instructions (Signed)

## 2021-08-05 ENCOUNTER — Other Ambulatory Visit: Payer: Self-pay | Admitting: Nurse Practitioner

## 2021-08-05 DIAGNOSIS — J301 Allergic rhinitis due to pollen: Secondary | ICD-10-CM

## 2021-08-25 ENCOUNTER — Other Ambulatory Visit: Payer: BC Managed Care – PPO

## 2021-08-27 ENCOUNTER — Ambulatory Visit
Admission: RE | Admit: 2021-08-27 | Discharge: 2021-08-27 | Disposition: A | Discharge status: Home / Self Care | Payer: Medicare HMO | Source: Ambulatory Visit | Attending: Endocrinology | Admitting: Endocrinology

## 2021-08-27 DIAGNOSIS — E890 Postprocedural hypothyroidism: Secondary | ICD-10-CM | POA: Diagnosis not present

## 2021-08-27 DIAGNOSIS — Z780 Asymptomatic menopausal state: Secondary | ICD-10-CM | POA: Diagnosis not present

## 2021-08-27 DIAGNOSIS — Z01419 Encounter for gynecological examination (general) (routine) without abnormal findings: Secondary | ICD-10-CM | POA: Diagnosis not present

## 2021-08-27 DIAGNOSIS — Z85850 Personal history of malignant neoplasm of thyroid: Secondary | ICD-10-CM

## 2021-08-27 DIAGNOSIS — Z8742 Personal history of other diseases of the female genital tract: Secondary | ICD-10-CM | POA: Diagnosis not present

## 2021-08-27 DIAGNOSIS — Z6841 Body Mass Index (BMI) 40.0 and over, adult: Secondary | ICD-10-CM | POA: Diagnosis not present

## 2021-08-28 ENCOUNTER — Ambulatory Visit: Payer: Medicare HMO | Admitting: Nurse Practitioner

## 2021-09-10 DIAGNOSIS — G8929 Other chronic pain: Secondary | ICD-10-CM | POA: Diagnosis not present

## 2021-09-10 DIAGNOSIS — Z8249 Family history of ischemic heart disease and other diseases of the circulatory system: Secondary | ICD-10-CM | POA: Diagnosis not present

## 2021-09-10 DIAGNOSIS — E039 Hypothyroidism, unspecified: Secondary | ICD-10-CM | POA: Diagnosis not present

## 2021-09-10 DIAGNOSIS — Z6841 Body Mass Index (BMI) 40.0 and over, adult: Secondary | ICD-10-CM | POA: Diagnosis not present

## 2021-09-10 DIAGNOSIS — I10 Essential (primary) hypertension: Secondary | ICD-10-CM | POA: Diagnosis not present

## 2021-09-10 DIAGNOSIS — R609 Edema, unspecified: Secondary | ICD-10-CM | POA: Diagnosis not present

## 2021-09-10 DIAGNOSIS — K219 Gastro-esophageal reflux disease without esophagitis: Secondary | ICD-10-CM | POA: Diagnosis not present

## 2021-09-10 DIAGNOSIS — Z791 Long term (current) use of non-steroidal anti-inflammatories (NSAID): Secondary | ICD-10-CM | POA: Diagnosis not present

## 2021-09-10 DIAGNOSIS — Z809 Family history of malignant neoplasm, unspecified: Secondary | ICD-10-CM | POA: Diagnosis not present

## 2021-09-10 DIAGNOSIS — Z833 Family history of diabetes mellitus: Secondary | ICD-10-CM | POA: Diagnosis not present

## 2021-09-10 DIAGNOSIS — Z880 Allergy status to penicillin: Secondary | ICD-10-CM | POA: Diagnosis not present

## 2021-09-17 ENCOUNTER — Encounter: Payer: Self-pay | Admitting: Nurse Practitioner

## 2021-09-17 ENCOUNTER — Ambulatory Visit (INDEPENDENT_AMBULATORY_CARE_PROVIDER_SITE_OTHER): Payer: Medicare HMO | Admitting: Nurse Practitioner

## 2021-09-17 VITALS — BP 134/82 | HR 79 | Temp 98.1°F | Resp 20 | Ht 67.0 in | Wt 267.0 lb

## 2021-09-17 DIAGNOSIS — M5441 Lumbago with sciatica, right side: Secondary | ICD-10-CM

## 2021-09-17 DIAGNOSIS — G8929 Other chronic pain: Secondary | ICD-10-CM

## 2021-09-17 MED ORDER — KETOROLAC TROMETHAMINE 60 MG/2ML IM SOLN
60.0000 mg | Freq: Once | INTRAMUSCULAR | Status: AC
  Administered 2021-09-17: 60 mg via INTRAMUSCULAR

## 2021-09-17 MED ORDER — METHYLPREDNISOLONE ACETATE 80 MG/ML IJ SUSP
80.0000 mg | Freq: Once | INTRAMUSCULAR | Status: AC
  Administered 2021-09-17: 80 mg via INTRAMUSCULAR

## 2021-09-17 NOTE — Patient Instructions (Signed)
Chronic Back Pain When back pain lasts longer than 3 months, it is called chronic back pain. Pain may get worse at certain times (flare-ups). There are things you can do at home to manage your pain. Follow these instructions at home: Pay attention to any changes in your symptoms. Take these actions to help with your pain: Managing pain and stiffness     If told, put ice on the painful area. Your doctor may tell you to use ice for 24-48 hours after the flare-up starts. To do this: Put ice in a plastic bag. Place a towel between your skin and the bag. Leave the ice on for 20 minutes, 2-3 times a day. If told, put heat on the painful area. Do this as often as told by your doctor. Use the heat source that your doctor recommends, such as a moist heat pack or a heating pad. Place a towel between your skin and the heat source. Leave the heat on for 20-30 minutes. Take off the heat if your skin turns bright red. This is especially important if you are unable to feel pain, heat, or cold. You may have a greater risk of getting burned. Soak in a warm bath. This can help relieve pain. Activity  Avoid bending and other activities that make pain worse. When standing: Keep your upper back and neck straight. Keep your shoulders pulled back. Avoid slouching. When sitting: Keep your back straight. Relax your shoulders. Do not round your shoulders or pull them backward. Do not sit or stand in one place for long periods of time. Take short rest breaks during the day. Lying down or standing is usually better than sitting. Resting can help relieve pain. When sitting or lying down for a long time, do some mild activity or stretching. This will help to prevent stiffness and pain. Get regular exercise. Ask your doctor what activities are safe for you. Do not lift anything that is heavier than 10 lb (4.5 kg) or the limit that you are told, until your doctor says that it is safe. To prevent injury when you lift  things: Bend your knees. Keep the weight close to your body. Avoid twisting. Sleep on a firm mattress. Try lying on your side with your knees slightly bent. If you lie on your back, put a pillow under your knees. Medicines Treatment may include medicines for pain and swelling taken by mouth or put on the skin, prescription pain medicine, or muscle relaxants. Take over-the-counter and prescription medicines only as told by your doctor. Ask your doctor if the medicine prescribed to you: Requires you to avoid driving or using machinery. Can cause trouble pooping (constipation). You may need to take these actions to prevent or treat trouble pooping: Drink enough fluid to keep your pee (urine) pale yellow. Take over-the-counter or prescription medicines. Eat foods that are high in fiber. These include beans, whole grains, and fresh fruits and vegetables. Limit foods that are high in fat and sugars. These include fried or sweet foods. General instructions Do not use any products that contain nicotine or tobacco, such as cigarettes, e-cigarettes, and chewing tobacco. If you need help quitting, ask your doctor. Keep all follow-up visits as told by your doctor. This is important. Contact a doctor if: Your pain does not get better with rest or medicine. Your pain gets worse, or you have new pain. You have a high fever. You lose weight very quickly. You have trouble doing your normal activities. Get help right away   if: One or both of your legs or feet feel weak. One or both of your legs or feet lose feeling (have numbness). You have trouble controlling when you poop (have a bowel movement) or pee (urinate). You have bad back pain and: You feel like you may vomit (nauseous), or you vomit. You have pain in your belly (abdomen). You have shortness of breath. You faint. Summary When back pain lasts longer than 3 months, it is called chronic back pain. Pain may get worse at certain times  (flare-ups). Use ice and heat as told by your doctor. Your doctor may tell you to use ice after flare-ups. This information is not intended to replace advice given to you by your health care provider. Make sure you discuss any questions you have with your health care provider. Document Revised: 05/23/2019 Document Reviewed: 05/23/2019 Elsevier Patient Education  2023 [employer]  

## 2021-09-17 NOTE — Progress Notes (Signed)
Subjective:    Patient ID: Toni Francis, female    DOB: [birth year], 66 y.o.   MRN: [id number]   Chief Complaint: Back Pain   Back Pain This is a chronic problem. The current episode started more than 1 year ago. The problem occurs constantly. The problem has been waxing and waning since onset. The pain is present in the lumbar spine. The quality of the pain is described as shooting. The pain does not radiate. The pain is at a severity of 8/10. The pain is moderate. The pain is Worse during the day. The symptoms are aggravated by lying down. Stiffness is present In the morning. She has tried analgesics for the symptoms. The treatment provided mild relief.   She was suppose to have had an injection in [place] but insurance would no approve it si she had t o cancel appointment. Recently git approved and has an appointment on September 25, 2021.   Review of Systems  Musculoskeletal:  Positive for back pain.      Objective:   Physical Exam Vitals and nursing note reviewed.  Constitutional:      General: She is not in acute distress.    Appearance: Normal appearance. She is well-developed.  Neck:     Vascular: No carotid bruit or JVD.  Cardiovascular:     Rate and Rhythm: Normal rate and regular rhythm.     Heart sounds: Normal heart sounds.  Pulmonary:     Effort: Pulmonary effort is normal. No respiratory distress.     Breath sounds: Normal breath sounds. No wheezing or rales.  Chest:     Chest wall: No tenderness.  Abdominal:     General: Bowel sounds are normal. There is no distension or abdominal bruit.     Palpations: Abdomen is soft. There is no hepatomegaly, splenomegaly, mass or pulsatile mass.     Tenderness: There is no abdominal tenderness.  Musculoskeletal:     Cervical back: Normal range of motion and neck supple.     Comments: Ain when going from sitting to standing Pain on flexion and extension of lumbar spine (-) SLR bil Motor strength and sensation distally intact   Lymphadenopathy:     Cervical: No cervical adenopathy.  Skin:    General: Skin is warm and dry.  Neurological:     Mental Status: She is alert and oriented to person, place, and time.     Deep Tendon Reflexes: Reflexes are normal and symmetric.  Psychiatric:        Behavior: Behavior normal.        Thought Content: Thought content normal.        Judgment: Judgment normal.    BP 134/82   Pulse 79   Temp 98.1 F (36.7 C) (Temporal)   Resp 20   Ht '5\' 7"'$  (1.702 m)   Wt 267 lb (121.1 kg)   SpO2 94%   BMI 41.82 kg/m        Assessment & Plan:  OMARIA PLUNK in today with chief complaint of Back Pain (Right side/)   1. Chronic midline low back pain with right-sided sciatica Moist heat  rest - methylPREDNISolone acetate (DEPO-MEDROL) injection 80 mg - ketorolac (TORADOL) injection 60 mg    The above assessment and management plan was discussed with the patient. The patient verbalized understanding of and has agreed to the management plan. Patient is aware to call the clinic if symptoms persist or worsen. Patient is aware when to return to the  clinic for a follow-up visit. Patient educated on when it is appropriate to go to the emergency department.   Mary-Margaret Hassell Done, FNP

## 2021-09-25 DIAGNOSIS — M5416 Radiculopathy, lumbar region: Secondary | ICD-10-CM | POA: Diagnosis not present

## 2021-09-25 IMAGING — DX DG CHEST 1V PORT
1 series · 1 of 1 positions shown · non-contrast
Comparison: 11/17/2007

CLINICAL DATA: Cough

EXAM:
PORTABLE CHEST 1 VIEW

[chest ap]
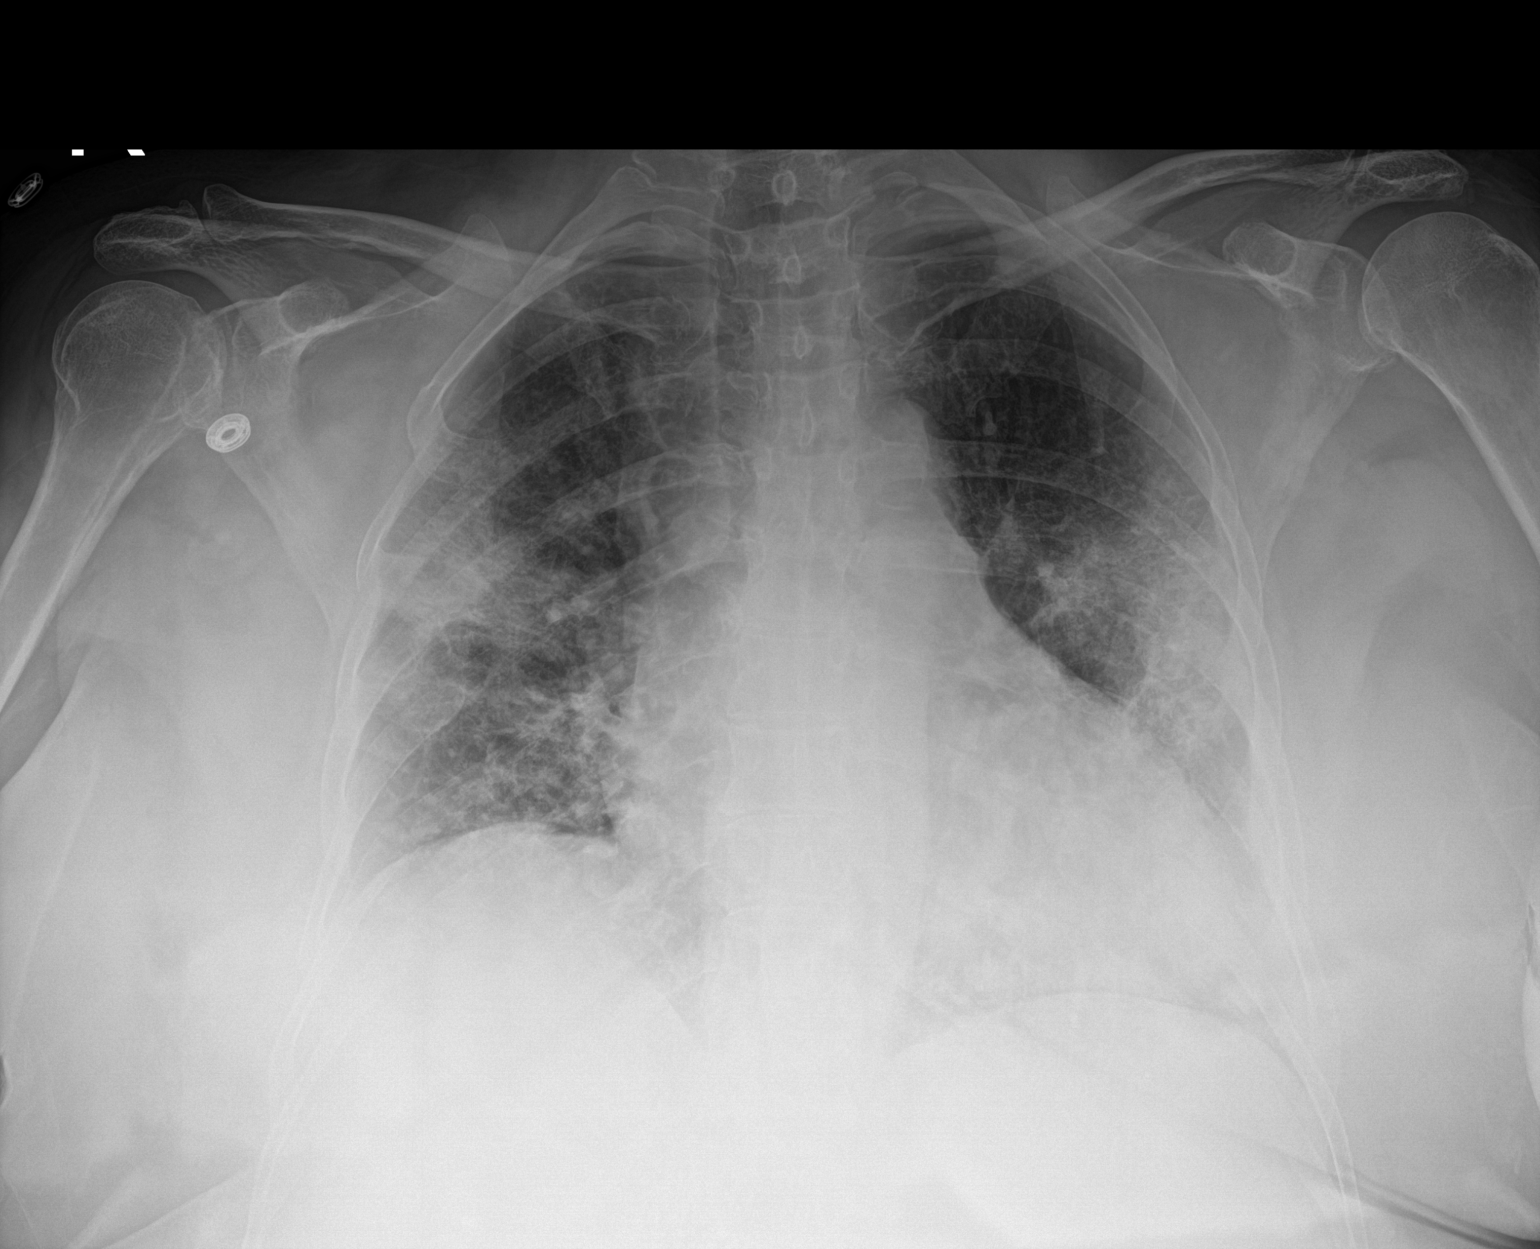

[1 of 1 positions shown; findings below may reference images not displayed]

FINDINGS: Patchy bilateral ground-glass opacity and consolidation consistent
with pneumonia. No pleural effusion. Mild cardiomegaly. No
pneumothorax.
IMPRESSION: Patchy bilateral ground-glass opacity and consolidation consistent
with pneumonia, appearance suspicious for atypical or viral
pneumonia.

## 2021-09-29 ENCOUNTER — Encounter: Payer: Self-pay | Admitting: Family

## 2021-09-29 ENCOUNTER — Ambulatory Visit (INDEPENDENT_AMBULATORY_CARE_PROVIDER_SITE_OTHER): Payer: Medicare HMO | Admitting: Family

## 2021-09-29 DIAGNOSIS — R197 Diarrhea, unspecified: Secondary | ICD-10-CM | POA: Diagnosis not present

## 2021-09-29 DIAGNOSIS — E860 Dehydration: Secondary | ICD-10-CM | POA: Diagnosis not present

## 2021-09-29 DIAGNOSIS — R531 Weakness: Secondary | ICD-10-CM | POA: Diagnosis not present

## 2021-09-29 DIAGNOSIS — R112 Nausea with vomiting, unspecified: Secondary | ICD-10-CM | POA: Diagnosis not present

## 2021-09-29 MED ORDER — LOPERAMIDE HCL 2 MG PO TABS: 2.0000 mg | ORAL_TABLET | Freq: Four times a day (QID) | ORAL | 0 refills | Status: DC | PRN

## 2021-09-29 MED ORDER — ONDANSETRON HCL 4 MG PO TABS: 4.0000 mg | ORAL_TABLET | Freq: Three times a day (TID) | ORAL | 0 refills | Status: DC | PRN

## 2021-09-29 NOTE — Patient Instructions (Signed)
Diarrhea, Adult Diarrhea is frequent loose and watery bowel movements. Diarrhea can make you feel weak and cause you to become dehydrated. Dehydration can make you tired and thirsty, cause you to have a dry mouth, and decrease how often you urinate. Diarrhea typically lasts 2-3 days. However, it can last longer if it is a sign of something more serious. It is important to treat your diarrhea as told by your health care provider. Follow these instructions at home: Eating and drinking     Follow these recommendations as told by your health care provider: Take an oral rehydration solution (ORS). This is an over-the-counter medicine that helps return your body to its normal balance of nutrients and water. It is found at pharmacies and retail stores. Drink plenty of fluids, such as water, ice chips, diluted fruit juice, and low-calorie sports drinks. You can drink milk also, if desired. Avoid drinking fluids that contain a lot of sugar or caffeine, such as energy drinks, sports drinks, and soda. Eat bland, easy-to-digest foods in small amounts as you are able. These foods include bananas, applesauce, rice, lean meats, toast, and crackers. Avoid alcohol. Avoid spicy or fatty foods.  Medicines Take over-the-counter and prescription medicines only as told by your health care provider. If you were prescribed an antibiotic medicine, take it as told by your health care provider. Do not stop using the antibiotic even if you start to feel better. General instructions  Wash your hands often using soap and water. If soap and water are not available, use a hand sanitizer. Others in the household should wash their hands as well. Hands should be washed: After using the toilet or changing a diaper. Before preparing, cooking, or serving food. While caring for a sick person or while visiting someone in a hospital. Drink enough fluid to keep your urine pale yellow. Rest at home while you recover. Watch your  condition for any changes. Take a warm bath to relieve any burning or pain from frequent diarrhea episodes. Keep all follow-up visits as told by your health care provider. This is important. Contact a health care provider if: You have a fever. Your diarrhea gets worse. You have new symptoms. You cannot keep fluids down. You feel light-headed or dizzy. You have a headache. You have muscle cramps. Get help right away if: You have chest pain. You feel extremely weak or you faint. You have bloody or black stools or stools that look like tar. You have severe pain, cramping, or bloating in your abdomen. You have trouble breathing or you are breathing very quickly. Your heart is beating very quickly. Your skin feels cold and clammy. You feel confused. You have signs of dehydration, such as: Dark urine, very little urine, or no urine. Cracked lips. Dry mouth. Sunken eyes. Sleepiness. Weakness. Summary Diarrhea is frequent loose and sometimes watery bowel movements. Diarrhea can make you feel weak and cause you to become dehydrated. Drink enough fluids to keep your urine pale yellow. Make sure that you wash your hands after using the toilet. If soap and water are not available, use hand sanitizer. Contact a health care provider if your diarrhea gets worse or you have new symptoms. Get help right away if you have signs of dehydration. This information is not intended to replace advice given to you by your health care provider. Make sure you discuss any questions you have with your health care provider. Document Revised: 10/22/2020 Document Reviewed: 10/22/2020 Elsevier Patient Education  2023 Elsevier Inc.  

## 2021-09-29 NOTE — Progress Notes (Signed)
Virtual Visit  Note Due to COVID-19 pandemic this visit was conducted virtually. This visit type was conducted due to national recommendations for restrictions regarding the COVID-19 Pandemic (e.g. social distancing, sheltering in place) in an effort to limit this patient's exposure and mitigate transmission in our community. All issues noted in this document were discussed and addressed.  A physical exam was not performed with this format.  I connected with Toni Francis on 09/29/21 at 12:46 pm by telephone and verified that I am speaking with the correct person using two identifiers. Toni Francis is currently located at home and no one is currently with her during visit. The provider, Evelina Dun, FNP is located in their office at time of visit.  I discussed the limitations, risks, security and privacy concerns of performing an evaluation and management service by telephone and the availability of in person appointments. I also discussed with the patient that there may be a patient responsible charge related to this service. The patient expressed understanding and agreed to proceed.   History and Present Illness:  Pt calls the office today with nausea and vomiting. States Sunday she was fine but after eating a bacon and egg biscuit she got sick two hours later. Reports she has been vomiting and diarrhea.  Diarrhea  This is a new problem. The current episode started in the past 7 days. The problem occurs more than 10 times per day. The problem has been gradually improving. The stool consistency is described as Watery. Associated symptoms include abdominal pain, bloating, headaches, myalgias and vomiting. Pertinent negatives include no chills, coughing or fever. Risk factors include suspect food intake. She has tried change of diet and increased fluids for the symptoms. The treatment provided no relief.     Review of Systems  Constitutional:  Negative for chills and fever.  Respiratory:   Negative for cough.   Gastrointestinal:  Positive for abdominal pain, bloating, diarrhea and vomiting.  Musculoskeletal:  Positive for myalgias.  Neurological:  Positive for headaches.  All other systems reviewed and are negative.   Observations/Objective: No SOB or distress noted  Assessment and Plan: 1. Diarrhea, unspecified type - loperamide (IMODIUM A-D) 2 MG tablet; Take 1 tablet (2 mg total) by mouth 4 (four) times daily as needed for diarrhea or loose stools.  Dispense: 30 tablet; Refill: 0  2. Nausea and vomiting, unspecified vomiting type - ondansetron (ZOFRAN) 4 MG tablet; Take 1 tablet (4 mg total) by mouth every 8 (eight) hours as needed for nausea or vomiting.  Dispense: 20 tablet; Refill: 0  3. Weakness - ondansetron (ZOFRAN) 4 MG tablet; Take 1 tablet (4 mg total) by mouth every 8 (eight) hours as needed for nausea or vomiting.  Dispense: 20 tablet; Refill: 0  4. Dehydration  Start Zofran as needed  Imodium as needed Given symptoms sounds like dehydration. Recommend going to ED, patient states her symptoms are improving and does not want to.  Reports she will if symptoms worsen Force fluids BRAT diet      I discussed the assessment and treatment plan with the patient. The patient was provided an opportunity to ask questions and all were answered. The patient agreed with the plan and demonstrated an understanding of the instructions.   The patient was advised to call back or seek an in-person evaluation if the symptoms worsen or if the condition fails to improve as anticipated.  The above assessment and management plan was discussed with the patient. The patient verbalized  understanding of and has agreed to the management plan. Patient is aware to call the clinic if symptoms persist or worsen. Patient is aware when to return to the clinic for a follow-up visit. Patient educated on when it is appropriate to go to the emergency department.   Time call ended:  12:57  pm   I provided 11 minutes of  non face-to-face time during this encounter.    Evelina Dun, FNP

## 2021-09-30 ENCOUNTER — Ambulatory Visit (INDEPENDENT_AMBULATORY_CARE_PROVIDER_SITE_OTHER): Payer: Medicare HMO | Admitting: Family Medicine

## 2021-09-30 ENCOUNTER — Telehealth: Payer: Self-pay | Admitting: Nurse Practitioner

## 2021-09-30 ENCOUNTER — Encounter: Payer: Self-pay | Admitting: Family Medicine

## 2021-09-30 VITALS — BP 113/77 | HR 82 | Temp 98.0°F | Ht 67.0 in | Wt 248.0 lb

## 2021-09-30 DIAGNOSIS — R112 Nausea with vomiting, unspecified: Secondary | ICD-10-CM | POA: Diagnosis not present

## 2021-09-30 DIAGNOSIS — K529 Noninfective gastroenteritis and colitis, unspecified: Secondary | ICD-10-CM

## 2021-09-30 DIAGNOSIS — R197 Diarrhea, unspecified: Secondary | ICD-10-CM | POA: Diagnosis not present

## 2021-09-30 MED ORDER — PROMETHAZINE HCL 25 MG/ML IJ SOLN: 25.0000 mg | Freq: Once | INTRAMUSCULAR | 0 refills | Status: DC

## 2021-09-30 MED ORDER — PROMETHAZINE HCL 25 MG/ML IJ SOLN
25.0000 mg | Freq: Four times a day (QID) | INTRAMUSCULAR | Status: AC | PRN
  Administered 2021-09-30: 25 mg via INTRAMUSCULAR

## 2021-09-30 NOTE — Telephone Encounter (Signed)
Pt came in today and seen [place]. Labs ordered by Sharyn Lull.

## 2021-09-30 NOTE — Addendum Note (Signed)
Addended by: Geryl Rankins D on: 09/30/2021 04:29 PM   Modules accepted: Orders

## 2021-09-30 NOTE — Patient Instructions (Signed)
BRAT diet - bananas, rice, applesauce, toast 

## 2021-09-30 NOTE — Progress Notes (Signed)
Subjective:  Patient ID: Toni Francis, female    DOB: 1955-12-07, 66 y.o.   MRN: [id number]  Patient Care Team: Chevis Pretty, FNP as PCP - General (Family Medicine)   Chief Complaint:  GI Problem   HPI: Toni Francis is a 66 y.o. female presenting on 09/30/2021 for GI Problem   Pt presents today with complaints of N/V/D which started Sunday after eating an egg and cheese biscuit. States she vomited several times on Sunday and this has since improved greatly. States she continues to have at least one diarrhea stool per day.   GI Problem The primary symptoms include fatigue, abdominal pain, nausea, vomiting and diarrhea. Primary symptoms do not include fever, weight loss, melena, hematemesis, jaundice, hematochezia, dysuria, myalgias, arthralgias or rash. The illness began 3 to 5 days ago. The onset was sudden. The problem has been gradually improving.  The fatigue began 3 to 5 days ago. The fatigue has been improving since its onset.  The abdominal pain has been gradually improving since its onset. The abdominal pain is generalized. The abdominal pain does not radiate. The abdominal pain is relieved by vomiting, bowel movements and being still.  Nausea began 3 to 5 days ago.  The vomiting began more than 2 days ago. Frequency: vomited several times the first day, has only vomitied once since yesterday. The emesis contains bilious material and stomach contents. Risk factors for illness leading to emesis include suspect food intake.  The illness does not include chills, anorexia, dysphagia, odynophagia, bloating, constipation, tenesmus, back pain or itching.       Relevant past medical, surgical, family, and social history reviewed and updated as indicated.  Allergies and medications reviewed and updated. Data reviewed: Chart in Epic.   Past Medical History:  Diagnosis Date   Abdominal hernia    Arthritis    GERD (gastroesophageal reflux disease)    Hypertension     Pinched nerve    L4-5,back    Past Surgical History:  Procedure Laterality Date   CHOLECYSTECTOMY     THYROIDECTOMY Right 08/13/2019   Procedure: RIGHT THYROID LOBECTOMY;  Surgeon: Rozetta Nunnery, MD;  Location: [facility] OR;  Service: ENT;  Laterality: Right;    Social History   Socioeconomic History   Marital status: Divorced    Spouse name: Not on file   Number of children: 2   Years of education: 12   Highest education level: Some college, no degree  Occupational History    Employer: [employer]  Tobacco Use   Smoking status: Never   Smokeless tobacco: Never  Vaping Use   Vaping Use: Never used  Substance and Sexual Activity   Alcohol use: No   Drug use: No   Sexual activity: Not on file  Other Topics Concern   Not on file  Social History Narrative   Not on file   Social Determinants of Health   Financial Resource Strain: Not on file  Food Insecurity: Not on file  Transportation Needs: Not on file  Physical Activity: Not on file  Stress: Not on file  Social Connections: Not on file  Intimate Partner Violence: Not on file    Outpatient Encounter Medications as of 09/30/2021  Medication Sig   acetaminophen (TYLENOL) 500 MG tablet Take 1,000 mg by mouth daily as needed for moderate pain or headache.   Ascorbic Acid (VITAMIN C) 500 MG CAPS Take by mouth.   Calcium Citrate-Vitamin D (CALCIUM + D PO) Take 1  tablet by mouth daily.   fluticasone (FLONASE) 50 MCG/ACT nasal spray USE 2 SPRAY(S) IN EACH NOSTRIL TWICE DAILY   levocetirizine (XYZAL) 5 MG tablet TAKE 1 TABLET BY MOUTH ONCE DAILY IN THE EVENING   levothyroxine (SYNTHROID) 75 MCG tablet Take 1 tablet (75 mcg total) by mouth daily.   loperamide (IMODIUM A-D) 2 MG tablet Take 1 tablet (2 mg total) by mouth 4 (four) times daily as needed for diarrhea or loose stools.   meloxicam (MOBIC) 15 MG tablet Take 1 tablet by mouth once daily   Multiple Vitamins-Minerals (CENTRUM SILVER PO) Take 1 tablet by  mouth daily.   Multiple Vitamins-Minerals (EYE VITAMINS PO) Take 1 capsule by mouth daily.   Nutritional Supplements (VITAMIN D BOOSTER PO) Take by mouth.   Omega-3 Fatty Acids (FISH OIL PO) Take 1 capsule by mouth daily.    omeprazole (PRILOSEC) 20 MG capsule Take 1 capsule by mouth once daily   ondansetron (ZOFRAN) 4 MG tablet Take 1 tablet (4 mg total) by mouth every 8 (eight) hours as needed for nausea or vomiting.   triamterene-hydrochlorothiazide (MAXZIDE-25) 37.5-25 MG tablet Take 1 tablet by mouth daily. For blood pressure and fluid   ZINC OXIDE PO Take by mouth.   No facility-administered encounter medications on file as of 09/30/2021.    Allergies  Allergen Reactions   Ampicillin Rash    Did it involve swelling of the face/tongue/throat, SOB, or low BP? No Did it involve sudden or severe rash/hives, skin peeling, or any reaction on the inside of your mouth or nose? Yes Did you need to seek medical attention at a hospital or doctor's office? Yes When did it last happen?      10 + years If all above answers are "NO", may proceed with cephalosporin use.    Codeine Nausea And Vomiting and Other (See Comments)    Also causes headaches    Pseudoephedrine Palpitations    Review of Systems  Constitutional:  Positive for activity change, appetite change and fatigue. Negative for chills, diaphoresis, fever, unexpected weight change and weight loss.  HENT: Negative.    Eyes: Negative.   Respiratory:  Negative for cough, chest tightness and shortness of breath.   Cardiovascular:  Negative for chest pain, palpitations and leg swelling.  Gastrointestinal:  Positive for abdominal pain, diarrhea, nausea and vomiting. Negative for abdominal distention, anal bleeding, anorexia, bloating, blood in stool, constipation, dysphagia, hematemesis, hematochezia, jaundice, melena and rectal pain.  Endocrine: Negative.   Genitourinary:  Negative for decreased urine volume, difficulty urinating,  dysuria, frequency and urgency.  Musculoskeletal:  Negative for arthralgias, back pain and myalgias.  Skin: Negative.  Negative for itching and rash.  Allergic/Immunologic: Negative.   Neurological:  Negative for dizziness, tremors, seizures, syncope, facial asymmetry, speech difficulty, weakness, light-headedness, numbness and headaches.  Hematological: Negative.   Psychiatric/Behavioral:  Negative for confusion, hallucinations, sleep disturbance and suicidal ideas.   All other systems reviewed and are negative.      Objective:  BP 113/77   Pulse 82   Temp 98 F (36.7 C)   Ht '5\' 7"'  (1.702 m)   Wt 248 lb (112.5 kg)   SpO2 96%   BMI 38.84 kg/m    Wt Readings from Last 3 Encounters:  09/30/21 248 lb (112.5 kg)  09/17/21 267 lb (121.1 kg)  07/30/21 267 lb 2 oz (121.2 kg)    Physical Exam Vitals and nursing note reviewed.  Constitutional:      General: She is not  in acute distress.    Appearance: Normal appearance. She is well-developed and well-groomed. She is obese. She is not ill-appearing, toxic-appearing or diaphoretic.  HENT:     Head: Normocephalic and atraumatic.     Jaw: There is normal jaw occlusion.     Right Ear: Hearing normal.     Left Ear: Hearing normal.     Nose: Nose normal.     Mouth/Throat:     Lips: Pink.     Mouth: Mucous membranes are moist.     Pharynx: Oropharynx is clear. Uvula midline.  Eyes:     General: Lids are normal.     Conjunctiva/sclera: Conjunctivae normal.     Pupils: Pupils are equal, round, and reactive to light.  Neck:     Thyroid: No thyroid mass, thyromegaly or thyroid tenderness.     Vascular: No carotid bruit or JVD.     Trachea: Trachea and phonation normal.  Cardiovascular:     Rate and Rhythm: Normal rate and regular rhythm.     Chest Wall: PMI is not displaced.     Pulses: Normal pulses.     Heart sounds: Normal heart sounds. No murmur heard.   No friction rub. No gallop.  Pulmonary:     Effort: Pulmonary effort is  normal. No respiratory distress.     Breath sounds: Normal breath sounds. No wheezing.  Abdominal:     General: Bowel sounds are normal. There is no distension or abdominal bruit.     Palpations: Abdomen is soft. There is no hepatomegaly, splenomegaly or mass.     Tenderness: There is no abdominal tenderness. There is no right CVA tenderness, left CVA tenderness, guarding or rebound.     Hernia: No hernia is present.  Musculoskeletal:        General: Normal range of motion.     Cervical back: Normal range of motion and neck supple.     Right lower leg: No edema.     Left lower leg: No edema.  Lymphadenopathy:     Cervical: No cervical adenopathy.  Skin:    General: Skin is warm and dry.     Capillary Refill: Capillary refill takes less than 2 seconds.     Coloration: Skin is not cyanotic, jaundiced or pale.     Findings: No rash.  Neurological:     General: No focal deficit present.     Mental Status: She is alert and oriented to person, place, and time.     Sensory: Sensation is intact.     Motor: Motor function is intact.     Coordination: Coordination is intact.     Gait: Gait is intact.     Deep Tendon Reflexes: Reflexes are normal and symmetric.  Psychiatric:        Attention and Perception: Attention and perception normal.        Mood and Affect: Mood and affect normal.        Speech: Speech normal.        Behavior: Behavior normal. Behavior is cooperative.        Thought Content: Thought content normal.        Cognition and Memory: Cognition and memory normal.        Judgment: Judgment normal.    Results for orders placed or performed in visit on 07/14/21  TSH  Result Value Ref Range   TSH 5.16 0.35 - 5.50 uIU/mL  T4, free  Result Value Ref Range   Free T4 0.92  0.60 - 1.60 ng/dL       Pertinent labs & imaging results that were available during my care of the patient were reviewed by me and considered in my medical decision making.  Assessment & Plan:  Toni Francis  was seen today for gi problem.  Diagnoses and all orders for this visit:  Gastroenteritis, acute No indications of acute bacterial infection. Will check CBC and BMP today. BRAT diet and adequate hydration discussed in detail. Aware to report any new, worsening, or persistent symptoms.  -     BMP8+EGFR -     CBC with Differential/Platelet  Nausea vomiting and diarrhea Has Zofran at home. Will dose with IM phenergan in office. Sedation precautions discussed in detail. BRAT diet and adequate hydration discussed in detail. Aware to report any new, worsening, or persistent symptoms.     Continue all other maintenance medications.  Follow up plan: Return if symptoms worsen or fail to improve.   Continue healthy lifestyle choices, including diet (rich in fruits, vegetables, and lean proteins, and low in salt and simple carbohydrates) and exercise (at least 30 minutes of moderate physical activity daily).  Educational handout given for gastroenteritis  The above assessment and management plan was discussed with the patient. The patient verbalized understanding of and has agreed to the management plan. Patient is aware to call the clinic if they develop any new symptoms or if symptoms persist or worsen. Patient is aware when to return to the clinic for a follow-up visit. Patient educated on when it is appropriate to go to the emergency department.   Monia Pouch, FNP-C [facility] [phone]

## 2021-10-01 LAB — CBC WITH DIFFERENTIAL/PLATELET
Basophils Absolute: 0 10*3/uL (ref 0.0–0.2)
Basos: 1 %
EOS (ABSOLUTE): 0 10*3/uL (ref 0.0–0.4)
Eos: 0 %
Hematocrit: 47.1 % — ABNORMAL HIGH (ref 34.0–46.6)
Hemoglobin: 15.6 g/dL (ref 11.1–15.9)
Immature Grans (Abs): 0.1 10*3/uL (ref 0.0–0.1)
Immature Granulocytes: 1 %
Lymphocytes Absolute: 0.9 10*3/uL (ref 0.7–3.1)
Lymphs: 11 %
MCH: 26.1 pg — ABNORMAL LOW (ref 26.6–33.0)
MCHC: 33.1 g/dL (ref 31.5–35.7)
MCV: 79 fL (ref 79–97)
Monocytes Absolute: 1.5 10*3/uL — ABNORMAL HIGH (ref 0.1–0.9)
Monocytes: 20 %
Neutrophils Absolute: 5.3 10*3/uL (ref 1.4–7.0)
Neutrophils: 67 %
Platelets: 279 10*3/uL (ref 150–450)
RBC: 5.98 x10E6/uL — ABNORMAL HIGH (ref 3.77–5.28)
RDW: 14.1 % (ref 11.7–15.4)
WBC: 7.8 10*3/uL (ref 3.4–10.8)

## 2021-10-01 LAB — BMP8+EGFR
BUN/Creatinine Ratio: 19 (ref 12–28)
BUN: 20 mg/dL (ref 8–27)
CO2: 20 mmol/L (ref 20–29)
Calcium: 9.2 mg/dL (ref 8.7–10.3)
Chloride: 102 mmol/L (ref 96–106)
Creatinine, Ser: 1.06 mg/dL — ABNORMAL HIGH (ref 0.57–1.00)
Glucose: 102 mg/dL — ABNORMAL HIGH (ref 70–99)
Potassium: 3.7 mmol/L (ref 3.5–5.2)
Sodium: 142 mmol/L (ref 134–144)
eGFR: 58 mL/min/{1.73_m2} — ABNORMAL LOW (ref >59)

## 2021-10-12 DIAGNOSIS — N870 Mild cervical dysplasia: Secondary | ICD-10-CM | POA: Diagnosis not present

## 2021-10-12 DIAGNOSIS — N879 Dysplasia of cervix uteri, unspecified: Secondary | ICD-10-CM | POA: Diagnosis not present

## 2021-10-12 DIAGNOSIS — R87612 Low grade squamous intraepithelial lesion on cytologic smear of cervix (LGSIL): Secondary | ICD-10-CM | POA: Diagnosis not present

## 2021-10-12 DIAGNOSIS — N72 Inflammatory disease of cervix uteri: Secondary | ICD-10-CM | POA: Diagnosis not present

## 2021-10-19 ENCOUNTER — Ambulatory Visit (INDEPENDENT_AMBULATORY_CARE_PROVIDER_SITE_OTHER): Payer: Medicare HMO | Admitting: Nurse Practitioner

## 2021-10-19 ENCOUNTER — Encounter: Payer: Self-pay | Admitting: Nurse Practitioner

## 2021-10-19 VITALS — BP 130/90 | HR 69 | Temp 97.6°F | Resp 20 | Ht 67.0 in | Wt 250.0 lb

## 2021-10-19 DIAGNOSIS — M1990 Unspecified osteoarthritis, unspecified site: Secondary | ICD-10-CM | POA: Diagnosis not present

## 2021-10-19 DIAGNOSIS — E890 Postprocedural hypothyroidism: Secondary | ICD-10-CM

## 2021-10-19 DIAGNOSIS — K219 Gastro-esophageal reflux disease without esophagitis: Secondary | ICD-10-CM

## 2021-10-19 DIAGNOSIS — I10 Essential (primary) hypertension: Secondary | ICD-10-CM | POA: Diagnosis not present

## 2021-10-19 MED ORDER — LEVOTHYROXINE SODIUM 75 MCG PO TABS: 75.0000 ug | ORAL_TABLET | Freq: Every day | ORAL | 3 refills | Status: DC

## 2021-10-19 MED ORDER — TRIAMTERENE-HCTZ 37.5-25 MG PO TABS: 1.0000 | ORAL_TABLET | Freq: Every day | ORAL | 3 refills | Status: DC

## 2021-10-19 MED ORDER — MELOXICAM 15 MG PO TABS: 15.0000 mg | ORAL_TABLET | Freq: Every day | ORAL | 1 refills | Status: DC

## 2021-10-19 MED ORDER — OMEPRAZOLE 20 MG PO CPDR: 20.0000 mg | DELAYED_RELEASE_CAPSULE | Freq: Every day | ORAL | 1 refills | Status: DC

## 2021-10-20 LAB — CBC WITH DIFFERENTIAL/PLATELET
Basophils Absolute: 0.1 10*3/uL (ref 0.0–0.2)
Basos: 1 %
EOS (ABSOLUTE): 0.1 10*3/uL (ref 0.0–0.4)
Eos: 1 %
Hematocrit: 40.1 % (ref 34.0–46.6)
Hemoglobin: 13 g/dL (ref 11.1–15.9)
Immature Grans (Abs): 0 10*3/uL (ref 0.0–0.1)
Immature Granulocytes: 0 %
Lymphocytes Absolute: 1.1 10*3/uL (ref 0.7–3.1)
Lymphs: 14 %
MCH: 26.4 pg — ABNORMAL LOW (ref 26.6–33.0)
MCHC: 32.4 g/dL (ref 31.5–35.7)
MCV: 81 fL (ref 79–97)
Monocytes Absolute: 0.7 10*3/uL (ref 0.1–0.9)
Monocytes: 9 %
Neutrophils Absolute: 5.7 10*3/uL (ref 1.4–7.0)
Neutrophils: 75 %
Platelets: 296 10*3/uL (ref 150–450)
RBC: 4.93 x10E6/uL (ref 3.77–5.28)
RDW: 14.8 % (ref 11.7–15.4)
WBC: 7.6 10*3/uL (ref 3.4–10.8)

## 2021-10-20 LAB — CMP14+EGFR
ALT: 21 IU/L (ref 0–32)
AST: 21 IU/L (ref 0–40)
Albumin/Globulin Ratio: 1.6 (ref 1.2–2.2)
Albumin: 4 g/dL (ref 3.8–4.8)
Alkaline Phosphatase: 94 IU/L (ref 44–121)
BUN/Creatinine Ratio: 19 (ref 12–28)
BUN: 18 mg/dL (ref 8–27)
Bilirubin Total: 0.6 mg/dL (ref 0.0–1.2)
CO2: 21 mmol/L (ref 20–29)
Calcium: 9.7 mg/dL (ref 8.7–10.3)
Chloride: 106 mmol/L (ref 96–106)
Creatinine, Ser: 0.96 mg/dL (ref 0.57–1.00)
Globulin, Total: 2.5 g/dL (ref 1.5–4.5)
Glucose: 92 mg/dL (ref 70–99)
Potassium: 4.5 mmol/L (ref 3.5–5.2)
Sodium: 143 mmol/L (ref 134–144)
Total Protein: 6.5 g/dL (ref 6.0–8.5)
eGFR: 66 mL/min/{1.73_m2} (ref >59)

## 2021-10-20 LAB — THYROID PANEL WITH TSH
Free Thyroxine Index: 2.6 (ref 1.2–4.9)
T3 Uptake Ratio: 29 % (ref 24–39)
T4, Total: 9.1 ug/dL (ref 4.5–12.0)
TSH: 6.62 u[IU]/mL — ABNORMAL HIGH (ref 0.450–4.500)

## 2021-10-20 LAB — LIPID PANEL
Chol/HDL Ratio: 2.6 ratio (ref 0.0–4.4)
Cholesterol, Total: 149 mg/dL (ref 100–199)
HDL: 58 mg/dL (ref >39)
LDL Chol Calc (NIH): 73 mg/dL (ref 0–99)
Triglycerides: 98 mg/dL (ref 0–149)
VLDL Cholesterol Cal: 18 mg/dL (ref 5–40)

## 2021-12-14 ENCOUNTER — Ambulatory Visit: Payer: Medicare HMO | Admitting: Nurse Practitioner

## 2021-12-29 DIAGNOSIS — M5416 Radiculopathy, lumbar region: Secondary | ICD-10-CM | POA: Diagnosis not present

## 2022-01-26 ENCOUNTER — Other Ambulatory Visit: Payer: Self-pay | Admitting: Nurse Practitioner

## 2022-01-26 DIAGNOSIS — J301 Allergic rhinitis due to pollen: Secondary | ICD-10-CM

## 2022-02-02 DIAGNOSIS — E559 Vitamin D deficiency, unspecified: Secondary | ICD-10-CM | POA: Diagnosis not present

## 2022-02-18 ENCOUNTER — Ambulatory Visit (INDEPENDENT_AMBULATORY_CARE_PROVIDER_SITE_OTHER): Payer: Medicare HMO | Admitting: Nurse Practitioner

## 2022-02-18 ENCOUNTER — Encounter: Payer: Self-pay | Admitting: Nurse Practitioner

## 2022-02-18 VITALS — BP 122/74 | HR 82 | Temp 97.5°F | Resp 20 | Ht 67.0 in | Wt 253.0 lb

## 2022-02-18 DIAGNOSIS — M25512 Pain in left shoulder: Secondary | ICD-10-CM

## 2022-02-18 MED ORDER — KETOROLAC TROMETHAMINE 60 MG/2ML IM SOLN
60.0000 mg | Freq: Once | INTRAMUSCULAR | Status: AC
  Administered 2022-02-18: 60 mg via INTRAMUSCULAR

## 2022-02-18 MED ORDER — METHYLPREDNISOLONE ACETATE 80 MG/ML IJ SUSP
80.0000 mg | Freq: Once | INTRAMUSCULAR | Status: AC
  Administered 2022-02-18: 80 mg via INTRAMUSCULAR

## 2022-02-18 NOTE — Patient Instructions (Signed)

## 2022-02-18 NOTE — Progress Notes (Signed)
   Subjective:    Patient ID: Toni Francis, female    DOB: 1956-03-07, 66 y.o.   MRN: [id number]   Chief Complaint: Arm Pain (Left arm)   Arm Pain  Pertinent negatives include no chest pain.   Patient is in today c/o left arm pain. He has been having to lift his dad for his ADL and she thinks that has what made her arm hurt. Rates pain 5/10 currently. Cortisone shots in her back helps pain. Using arm increases pain. Has soe numbness at night time distally.    Review of Systems  Constitutional:  Negative for diaphoresis.  Eyes:  Negative for pain.  Respiratory:  Negative for shortness of breath.   Cardiovascular:  Negative for chest pain, palpitations and leg swelling.  Gastrointestinal:  Negative for abdominal pain.  Endocrine: Negative for polydipsia.  Musculoskeletal:  Positive for arthralgias (left arm).  Skin:  Negative for rash.  Neurological:  Negative for dizziness, weakness and headaches.  Hematological:  Does not bruise/bleed easily.  All other systems reviewed and are negative.      Objective:   Physical Exam Constitutional:      Appearance: Normal appearance.  Cardiovascular:     Rate and Rhythm: Normal rate and regular rhythm.     Heart sounds: Normal heart sounds.  Pulmonary:     Effort: Pulmonary effort is normal.     Breath sounds: Normal breath sounds.  Skin:    General: Skin is warm.  Neurological:     General: No focal deficit present.     Mental Status: She is alert and oriented to person, place, and time.  Psychiatric:        Mood and Affect: Mood normal.        Behavior: Behavior normal.    BP 122/74   Pulse 82   Temp (!) 97.5 F (36.4 C) (Temporal)   Resp 20   Ht '5\' 7"'$  (1.702 m)   Wt 253 lb (114.8 kg)   SpO2 94%   BMI 39.63 kg/m         Assessment & Plan:  ARMONII SIEH in today with chief complaint of Arm Pain (Left arm)   1. Acute pain of left shoulder Moist heat Rest  Continue messages - ketorolac (TORADOL) injection 60  mg - methylPREDNISolone acetate (DEPO-MEDROL) injection 80 mg    The above assessment and management plan was discussed with the patient. The patient verbalized understanding of and has agreed to the management plan. Patient is aware to call the clinic if symptoms persist or worsen. Patient is aware when to return to the clinic for a follow-up visit. Patient educated on when it is appropriate to go to the emergency department.   Mary-Margaret Hassell Done, FNP

## 2022-02-19 ENCOUNTER — Telehealth: Payer: Self-pay | Admitting: Nurse Practitioner

## 2022-02-19 NOTE — Telephone Encounter (Signed)
Patient is calling back on message left this morning. Please call

## 2022-02-19 NOTE — Telephone Encounter (Signed)
Pt is not happy. States that she is having to lift her father and shoulder is very painful. Pt state that she will have to suffer over the weekend. Again advised that Shelah Lewandowsky wants to give it a little bit more time. Pt then hung up.

## 2022-02-19 NOTE — Telephone Encounter (Signed)
Have to give it a little time to work

## 2022-03-22 ENCOUNTER — Telehealth: Payer: Self-pay | Admitting: Nurse Practitioner

## 2022-03-22 NOTE — Telephone Encounter (Signed)
Pt called requesting to be seen today for red eye. Explained to pt that we dont have anymore openings today for in person or telephone/video.   Pt requested that I send MMM a message to see if she could be worked in today.  Please advise.

## 2022-03-22 NOTE — Telephone Encounter (Signed)
Left detailed message on patients voicemail that we could not work her in today but we could tomorrow if she wanted [place] to. Advised to contact the office for an appt

## 2022-03-30 DIAGNOSIS — M5416 Radiculopathy, lumbar region: Secondary | ICD-10-CM | POA: Diagnosis not present

## 2022-04-16 ENCOUNTER — Ambulatory Visit (INDEPENDENT_AMBULATORY_CARE_PROVIDER_SITE_OTHER): Payer: Medicare HMO | Admitting: Family

## 2022-04-16 ENCOUNTER — Encounter: Payer: Self-pay | Admitting: Family

## 2022-04-16 VITALS — BP 129/86 | HR 87 | Temp 97.7°F | Ht 67.0 in | Wt 256.4 lb

## 2022-04-16 DIAGNOSIS — J019 Acute sinusitis, unspecified: Secondary | ICD-10-CM | POA: Diagnosis not present

## 2022-04-16 DIAGNOSIS — B9689 Other specified bacterial agents as the cause of diseases classified elsewhere: Secondary | ICD-10-CM | POA: Diagnosis not present

## 2022-04-16 DIAGNOSIS — J301 Allergic rhinitis due to pollen: Secondary | ICD-10-CM | POA: Diagnosis not present

## 2022-04-16 MED ORDER — FLUTICASONE PROPIONATE 50 MCG/ACT NA SUSP: 2.0000 | Freq: Every day | NASAL | 1 refills | Status: DC

## 2022-04-16 MED ORDER — DOXYCYCLINE HYCLATE 100 MG PO TABS: 100.0000 mg | ORAL_TABLET | Freq: Two times a day (BID) | ORAL | 0 refills | Status: DC

## 2022-04-16 NOTE — Progress Notes (Signed)
Subjective:    Patient ID: Toni Francis, female    DOB: 01/25/1956, 66 y.o.   MRN: [id number]  Chief Complaint  Patient presents with   Nasal Congestion   Facial Pain   PT presents to the office today with sinus congestion for the last two weeks.  Sinusitis This is a new problem. The current episode started 1 to 4 weeks ago. The problem has been gradually worsening since onset. There has been no fever. Her pain is at a severity of 8/10. The pain is moderate. Associated symptoms include congestion, coughing, ear pain (right), headaches, a hoarse voice, sinus pressure, sneezing and a sore throat. Past treatments include oral decongestants, saline nose sprays, acetaminophen and nasal decongestants. The treatment provided mild relief.      Review of Systems  HENT:  Positive for congestion, ear pain (right), hoarse voice, sinus pressure, sneezing and sore throat.   Respiratory:  Positive for cough.   Neurological:  Positive for headaches.  All other systems reviewed and are negative.      Objective:   Physical Exam Vitals reviewed.  Constitutional:      General: She is not in acute distress.    Appearance: She is well-developed. She is obese.  HENT:     Head: Normocephalic and atraumatic.     Right Ear: Tympanic membrane normal.     Left Ear: Tympanic membrane normal.     Nose:     Right Sinus: Maxillary sinus tenderness and frontal sinus tenderness present.     Left Sinus: Maxillary sinus tenderness and frontal sinus tenderness present.  Eyes:     Pupils: Pupils are equal, round, and reactive to light.  Neck:     Thyroid: No thyromegaly.  Cardiovascular:     Rate and Rhythm: Normal rate and regular rhythm.     Heart sounds: Normal heart sounds. No murmur heard. Pulmonary:     Effort: Pulmonary effort is normal. No respiratory distress.     Breath sounds: Normal breath sounds. No wheezing.  Abdominal:     General: Bowel sounds are normal. There is no distension.      Palpations: Abdomen is soft.     Tenderness: There is no abdominal tenderness.  Musculoskeletal:        General: No tenderness. Normal range of motion.     Cervical back: Normal range of motion and neck supple.  Skin:    General: Skin is warm and dry.  Neurological:     Mental Status: She is alert and oriented to person, place, and time.     Cranial Nerves: No cranial nerve deficit.     Deep Tendon Reflexes: Reflexes are normal and symmetric.  Psychiatric:        Behavior: Behavior normal.        Thought Content: Thought content normal.        Judgment: Judgment normal.      BP 129/86   Pulse 87   Temp 97.7 F (36.5 C) (Temporal)   Ht '5\' 7"'$  (1.702 m)   Wt 256 lb 6.4 oz (116.3 kg)   SpO2 99%   BMI 40.16 kg/m       Assessment & Plan:  Toni Francis comes in today with chief complaint of Nasal Congestion and Facial Pain   Diagnosis and orders addressed:  1. Acute bacterial sinusitis - Take meds as prescribed - Use a cool mist humidifier  -Use saline nose sprays frequently -Force fluids -For any cough or congestion  Use plain Mucinex- regular strength or max strength is fine -For fever or aces or pains- take tylenol or ibuprofen. -Throat lozenges if help -Follow up if symptoms worsen or do not improve  - doxycycline (VIBRA-TABS) 100 MG tablet; Take 1 tablet (100 mg total) by mouth 2 (two) times daily.  Dispense: 20 tablet; Refill: 0   Evelina Dun, FNP

## 2022-04-16 NOTE — Patient Instructions (Signed)

## 2022-04-22 ENCOUNTER — Ambulatory Visit (INDEPENDENT_AMBULATORY_CARE_PROVIDER_SITE_OTHER): Payer: Medicare HMO | Admitting: Nurse Practitioner

## 2022-04-22 ENCOUNTER — Encounter: Payer: Self-pay | Admitting: Nurse Practitioner

## 2022-04-22 VITALS — BP 160/94 | HR 71 | Temp 97.5°F | Resp 20 | Ht 67.0 in | Wt 256.0 lb

## 2022-04-22 DIAGNOSIS — K219 Gastro-esophageal reflux disease without esophagitis: Secondary | ICD-10-CM | POA: Diagnosis not present

## 2022-04-22 DIAGNOSIS — I10 Essential (primary) hypertension: Secondary | ICD-10-CM | POA: Diagnosis not present

## 2022-04-22 DIAGNOSIS — E890 Postprocedural hypothyroidism: Secondary | ICD-10-CM | POA: Diagnosis not present

## 2022-04-22 MED ORDER — TRIAMTERENE-HCTZ 37.5-25 MG PO TABS: 1.0000 | ORAL_TABLET | Freq: Every day | ORAL | 1 refills | Status: DC

## 2022-04-22 MED ORDER — OMEPRAZOLE 20 MG PO CPDR: 20.0000 mg | DELAYED_RELEASE_CAPSULE | Freq: Every day | ORAL | 1 refills | Status: DC

## 2022-04-22 MED ORDER — LEVOTHYROXINE SODIUM 75 MCG PO TABS: 75.0000 ug | ORAL_TABLET | Freq: Every day | ORAL | 1 refills | Status: DC

## 2022-04-22 NOTE — Progress Notes (Addendum)
Subjective:    Patient ID: Toni Francis, female    DOB: [birth year], 66 y.o.   MRN: [id number]   Chief Complaint: medical management of chronic issues     HPI:  Toni Francis is a 66 y.o. who identifies as a female who was assigned female at birth.   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 Primary hypertension No c/o chest pain, sob or headache. Does not check blood pressure at home. BP Readings from Last 3 Encounters:  04/16/22 129/86  02/18/22 122/74  10/19/21 130/90     2. Postoperative hypothyroidism No issues that she is aware of. Patient had missed taking this prior to last visit. She was encouraged  to take daily and recheck before changing dosage of medication. Lab Results  Component Value Date   TSH 6.620 (H) 10/19/2021     3. Gastroesophageal reflux disease without esophagitis Is on omeprazole daily and is doing well.  4. Morbid obesity ([place]) No recent weight changes  Wt Readings from Last 3 Encounters:  04/22/22 256 lb (116.1 kg)  04/16/22 256 lb 6.4 oz (116.3 kg)  02/18/22 253 lb (114.8 kg)   BMI Readings from Last 3 Encounters:  04/22/22 40.10 kg/m  04/16/22 40.16 kg/m  02/18/22 39.63 kg/m    New complaints: None today  Allergies  Allergen Reactions   Ampicillin Rash    Did it involve swelling of the face/tongue/throat, SOB, or low BP? No Did it involve sudden or severe rash/hives, skin peeling, or any reaction on the inside of your mouth or nose? Yes Did you need to seek medical attention at a hospital or doctor's office? Yes When did it last happen?      10 + years If all above answers are "NO", may proceed with cephalosporin use.    Codeine Nausea And Vomiting and Other (See Comments)    Also causes headaches    Pseudoephedrine Palpitations   Outpatient Encounter Medications as of  04/22/2022  Medication Sig   acetaminophen (TYLENOL) 500 MG tablet Take 1,000 mg by mouth daily as needed for moderate pain or headache.   Ascorbic Acid (VITAMIN C) 500 MG CAPS Take by mouth.   doxycycline (VIBRA-TABS) 100 MG tablet Take 1 tablet (100 mg total) by mouth 2 (two) times daily.   fluticasone (FLONASE) 50 MCG/ACT nasal spray Place 2 sprays into both nostrils daily. USE 2 SPRAY(S) IN EACH NOSTRIL TWICE DAILY   levocetirizine (XYZAL) 5 MG tablet TAKE 1 TABLET BY MOUTH ONCE DAILY IN THE EVENING   levothyroxine (SYNTHROID) 75 MCG tablet Take 1 tablet (75 mcg total) by mouth daily.   meloxicam (MOBIC) 15 MG tablet Take 1 tablet (15 mg total) by mouth daily.   Multiple Vitamins-Minerals (CENTRUM SILVER PO) Take 1 tablet by mouth daily.   Multiple Vitamins-Minerals (EYE VITAMINS PO) Take 1 capsule by mouth daily.   Nutritional Supplements (VITAMIN D BOOSTER PO) Take by mouth.   Omega-3 Fatty Acids (FISH OIL PO) Take 1 capsule by mouth daily.    omeprazole (PRILOSEC) 20 MG capsule Take 1 capsule (20 mg total) by mouth daily.   triamterene-hydrochlorothiazide (MAXZIDE-25) 37.5-25 MG tablet Take 1 tablet by mouth daily. For blood pressure and fluid   ZINC OXIDE PO Take by mouth.   Facility-Administered Encounter Medications as of 04/22/2022  Medication   promethazine (PHENERGAN) injection  25 mg    Past Surgical History:  Procedure Laterality Date   CHOLECYSTECTOMY     THYROIDECTOMY Right 08/13/2019   Procedure: RIGHT THYROID LOBECTOMY;  Surgeon: Rozetta Nunnery, MD;  Location: [facility];  Service: ENT;  Laterality: Right;    Family History  Problem Relation Age of Onset   Colon cancer Maternal Uncle 50      Controlled substance contract: n/a     Review of Systems  Constitutional:  Negative for diaphoresis.  Eyes:  Negative for pain.  Respiratory:  Negative for shortness of breath.   Cardiovascular:  Negative for chest pain, palpitations and leg swelling.   Gastrointestinal:  Negative for abdominal pain.  Endocrine: Negative for polydipsia.  Skin:  Negative for rash.  Neurological:  Negative for dizziness, weakness and headaches.  Hematological:  Does not bruise/bleed easily.  All other systems reviewed and are negative.      Objective:   Physical Exam Vitals and nursing note reviewed.  Constitutional:      General: She is not in acute distress.    Appearance: Normal appearance. She is well-developed.  HENT:     Head: Normocephalic.     Right Ear: Tympanic membrane normal.     Left Ear: Tympanic membrane normal.     Nose: Nose normal.     Mouth/Throat:     Mouth: Mucous membranes are moist.  Eyes:     Pupils: Pupils are equal, round, and reactive to light.  Neck:     Vascular: No carotid bruit or JVD.  Cardiovascular:     Rate and Rhythm: Normal rate and regular rhythm.     Heart sounds: Normal heart sounds.  Pulmonary:     Effort: Pulmonary effort is normal. No respiratory distress.     Breath sounds: Normal breath sounds. No wheezing or rales.  Chest:     Chest wall: No tenderness.  Abdominal:     General: Bowel sounds are normal. There is no distension or abdominal bruit.     Palpations: Abdomen is soft. There is no hepatomegaly, splenomegaly, mass or pulsatile mass.     Tenderness: There is no abdominal tenderness.  Musculoskeletal:        General: Normal range of motion.     Cervical back: Normal range of motion and neck supple.  Lymphadenopathy:     Cervical: No cervical adenopathy.  Skin:    General: Skin is warm and dry.  Neurological:     Mental Status: She is alert and oriented to person, place, and time.     Deep Tendon Reflexes: Reflexes are normal and symmetric.  Psychiatric:        Behavior: Behavior normal.        Thought Content: Thought content normal.        Judgment: Judgment normal.    BP (!) 160/94   Pulse 71   Temp (!) 97.5 F (36.4 C) (Temporal)   Resp 20   Ht _0  (1.702 m)   Wt 256  lb (116.1 kg)   SpO2 94%   BMI 40.10 kg/m         Assessment & Plan:  Toni Francis comes in today with chief complaint of Medical Management of Chronic Issues   Diagnosis and orders addressed:  1. Primary hypertension Low sodium diet - triamterene-hydrochlorothiazide (MAXZIDE-25) 37.5-25 MG tablet; Take 1 tablet by mouth daily. For blood pressure and fluid  Dispense: 90 tablet; Refill: 1  2. Postoperative hypothyroidism Labs pending - levothyroxine (  SYNTHROID) 75 MCG tablet; Take 1 tablet (75 mcg total) by mouth daily.  Dispense: 90 tablet; Refill: 1  3. Gastroesophageal reflux disease without esophagitis Avoid spicy foods Do not eat 2 hours prior to bedtime  - omeprazole (PRILOSEC) 20 MG capsule; Take 1 capsule (20 mg total) by mouth daily.  Dispense: 90 capsule; Refill: 1  4. Morbid obesity ([place]) Discussed diet and exercise for person with BMI >25 Will recheck weight in 3-6 months  Orders Placed This Encounter  Procedures   CBC with Differential/Platelet   CMP14+EGFR   Lipid panel   Thyroid Panel With TSH     Labs pending Health Maintenance reviewed Diet and exercise encouraged  Follow up plan: 6 months   [place], FNP

## 2022-04-22 NOTE — Addendum Note (Signed)
Addended by: Chevis Pretty on: 04/22/2022 09:09 AM   Modules accepted: Orders

## 2022-04-22 NOTE — Patient Instructions (Signed)

## 2022-04-23 ENCOUNTER — Ambulatory Visit: Payer: Medicare HMO | Admitting: Nurse Practitioner

## 2022-04-23 LAB — CMP14+EGFR
ALT: 19 IU/L (ref 0–32)
AST: 15 IU/L (ref 0–40)
Albumin/Globulin Ratio: 2.2 (ref 1.2–2.2)
Albumin: 4.1 g/dL (ref 3.9–4.9)
Alkaline Phosphatase: 86 IU/L (ref 44–121)
BUN/Creatinine Ratio: 20 (ref 12–28)
BUN: 17 mg/dL (ref 8–27)
Bilirubin Total: 0.4 mg/dL (ref 0.0–1.2)
CO2: 24 mmol/L (ref 20–29)
Calcium: 9.3 mg/dL (ref 8.7–10.3)
Chloride: 106 mmol/L (ref 96–106)
Creatinine, Ser: 0.86 mg/dL (ref 0.57–1.00)
Globulin, Total: 1.9 g/dL (ref 1.5–4.5)
Glucose: 89 mg/dL (ref 70–99)
Potassium: 4.3 mmol/L (ref 3.5–5.2)
Sodium: 144 mmol/L (ref 134–144)
Total Protein: 6 g/dL (ref 6.0–8.5)
eGFR: 75 mL/min/{1.73_m2} (ref >59)

## 2022-04-23 LAB — THYROID PANEL WITH TSH
Free Thyroxine Index: 2.4 (ref 1.2–4.9)
T3 Uptake Ratio: 29 % (ref 24–39)
T4, Total: 8.2 ug/dL (ref 4.5–12.0)
TSH: 4.18 u[IU]/mL (ref 0.450–4.500)

## 2022-04-23 LAB — LIPID PANEL
Chol/HDL Ratio: 3.5 ratio (ref 0.0–4.4)
Cholesterol, Total: 180 mg/dL (ref 100–199)
HDL: 51 mg/dL (ref >39)
LDL Chol Calc (NIH): 103 mg/dL — ABNORMAL HIGH (ref 0–99)
Triglycerides: 150 mg/dL — ABNORMAL HIGH (ref 0–149)
VLDL Cholesterol Cal: 26 mg/dL (ref 5–40)

## 2022-04-23 LAB — CBC WITH DIFFERENTIAL/PLATELET
Basophils Absolute: 0.1 10*3/uL (ref 0.0–0.2)
Basos: 1 %
EOS (ABSOLUTE): 0.1 10*3/uL (ref 0.0–0.4)
Eos: 1 %
Hematocrit: 43.8 % (ref 34.0–46.6)
Hemoglobin: 13.9 g/dL (ref 11.1–15.9)
Immature Grans (Abs): 0 10*3/uL (ref 0.0–0.1)
Immature Granulocytes: 0 %
Lymphocytes Absolute: 1.7 10*3/uL (ref 0.7–3.1)
Lymphs: 26 %
MCH: 27.1 pg (ref 26.6–33.0)
MCHC: 31.7 g/dL (ref 31.5–35.7)
MCV: 86 fL (ref 79–97)
Monocytes Absolute: 0.5 10*3/uL (ref 0.1–0.9)
Monocytes: 8 %
Neutrophils Absolute: 4.3 10*3/uL (ref 1.4–7.0)
Neutrophils: 64 %
Platelets: 249 10*3/uL (ref 150–450)
RBC: 5.12 x10E6/uL (ref 3.77–5.28)
RDW: 14 % (ref 11.7–15.4)
WBC: 6.6 10*3/uL (ref 3.4–10.8)

## 2022-04-27 ENCOUNTER — Ambulatory Visit (INDEPENDENT_AMBULATORY_CARE_PROVIDER_SITE_OTHER): Payer: Medicare HMO | Admitting: Nurse Practitioner

## 2022-04-27 ENCOUNTER — Encounter: Payer: Self-pay | Admitting: Nurse Practitioner

## 2022-04-27 VITALS — BP 121/82 | HR 66 | Temp 97.0°F | Resp 20 | Ht 67.0 in | Wt 256.0 lb

## 2022-04-27 DIAGNOSIS — Z0000 Encounter for general adult medical examination without abnormal findings: Secondary | ICD-10-CM

## 2022-04-27 NOTE — Patient Instructions (Signed)

## 2022-04-27 NOTE — Progress Notes (Signed)
Subjective:    Toni Francis is a 67 y.o. female who presents for a Welcome to Medicare exam.   Review of Systems Review of Systems  Constitutional:  Negative for diaphoresis and weight loss.  Eyes:  Negative for blurred vision, double vision and pain.  Respiratory:  Negative for shortness of breath.   Cardiovascular:  Negative for chest pain, palpitations, orthopnea and leg swelling.  Gastrointestinal:  Negative for abdominal pain.  Skin:  Negative for rash.  Neurological:  Negative for dizziness, sensory change, loss of consciousness, weakness and headaches.  Endo/Heme/Allergies:  Negative for polydipsia. Does not bruise/bleed easily.  Psychiatric/Behavioral:  Negative for memory loss. The patient does not have insomnia.   All other systems reviewed and are negative.   Cardiac Risk Factors include: advanced age (>75mn, >>82women);obesity (BMI >30kg/m2);sedentary lifestyle      Objective:    Today's Vitals   04/27/22 0918 04/27/22 0920  BP: 121/82   Pulse: 66   Resp: 20   Temp: (!) 97 F (36.1 C)   TempSrc: Temporal   SpO2: 94%   Weight: 256 lb (116.1 kg)   Height: '5\' 7"'$  (1.702 m)   PainSc:  5   Body mass index is 40.1 kg/m.  Medications Outpatient Encounter Medications as of 04/27/2022  Medication Sig   acetaminophen (TYLENOL) 500 MG tablet Take 1,000 mg by mouth daily as needed for moderate pain or headache.   Ascorbic Acid (VITAMIN C) 500 MG CAPS Take by mouth.   fluticasone (FLONASE) 50 MCG/ACT nasal spray Place 2 sprays into both nostrils daily. USE 2 SPRAY(S) IN EACH NOSTRIL TWICE DAILY   levocetirizine (XYZAL) 5 MG tablet TAKE 1 TABLET BY MOUTH ONCE DAILY IN THE EVENING   levothyroxine (SYNTHROID) 75 MCG tablet Take 1 tablet (75 mcg total) by mouth daily.   meloxicam (MOBIC) 15 MG tablet Take 1 tablet (15 mg total) by mouth daily.   Multiple Vitamins-Minerals (CENTRUM SILVER PO) Take 1 tablet by mouth daily.   Multiple Vitamins-Minerals (EYE VITAMINS PO)  Take 1 capsule by mouth daily.   Nutritional Supplements (VITAMIN D BOOSTER PO) Take by mouth.   Omega-3 Fatty Acids (FISH OIL PO) Take 1 capsule by mouth daily.    omeprazole (PRILOSEC) 20 MG capsule Take 1 capsule (20 mg total) by mouth daily.   triamterene-hydrochlorothiazide (MAXZIDE-25) 37.5-25 MG tablet Take 1 tablet by mouth daily. For blood pressure and fluid   ZINC OXIDE PO Take by mouth.   Facility-Administered Encounter Medications as of 04/27/2022  Medication   promethazine (PHENERGAN) injection 25 mg     History: Past Medical History:  Diagnosis Date   Abdominal hernia    Arthritis    GERD (gastroesophageal reflux disease)    Hypertension    Pinched nerve    L4-5,back   Past Surgical History:  Procedure Laterality Date   CHOLECYSTECTOMY     THYROIDECTOMY Right 08/13/2019   Procedure: RIGHT THYROID LOBECTOMY;  Surgeon: NRozetta Nunnery MD;  [facility];  Service: ENT;  Laterality: Right;    Family History  Problem Relation Age of Onset   Colon cancer Maternal Uncle 563  Social History   Occupational History    Employer: [employer]  Occupation: Retired  Tobacco Use   Smoking status: Never   Smokeless tobacco: Never  Vaping Use   Vaping Use: Never used  Substance and Sexual Activity   Alcohol use: No   Drug use: No   Sexual activity: Not  Currently    Tobacco Counseling Counseling given: Not Answered   Immunizations and Health Maintenance Immunization History  Administered Date(s) Administered   Influenza,inj,Quad PF,6+ Mos 02/08/2019   PFIZER(Purple Top)SARS-COV-2 Vaccination 06/23/2019, 07/14/2019   Tdap 02/22/2012   Zoster, Live 02/22/2012   Health Maintenance Due  Topic Date Due   Medicare Annual Wellness (AWV)  Never done   Hepatitis C Screening  Never done   DEXA SCAN  Never done   DTaP/Tdap/Td (2 - Td or Tdap) 02/21/2022    Activities of Daily Living    04/27/2022    9:29 AM  In your present state of health, do  you have any difficulty performing the following activities:  Hearing? 0  Vision? 0  Difficulty concentrating or making decisions? 0  Walking or climbing stairs? 0  Dressing or bathing? 0  Doing errands, shopping? 0  Preparing Food and eating ? N  Using the Toilet? N  In the past six months, have you accidently leaked urine? N  Do you have problems with loss of bowel control? N  Managing your Medications? N  Managing your Finances? N  Housekeeping or managing your Housekeeping? N    Physical Exam  (EKG- sinus tachycardia  Advanced Directives: Does Patient Have a Medical Advance Directive?: No Would patient like information on creating a medical advance directive?: No - Patient declined    Assessment:    This is a routine wellness examination for this patient . Welcome to medicare  Vision/Hearing screen normal  Dietary issues and exercise activities discussed:  Current Exercise Habits: The patient does not participate in regular exercise at present, Exercise limited by: orthopedic condition(s)   Goals      DIET - EAT MORE FRUITS AND VEGETABLES     Exercise 150 min/wk Moderate Activity       Depression Screen    04/22/2022    8:38 AM 04/16/2022    3:09 PM 10/19/2021    8:18 AM 09/30/2021    3:12 PM  PHQ 2/9 Scores  PHQ - 2 Score 0 0 0 0  PHQ- 9 Score 0  0 0     Fall Risk    04/27/2022    9:29 AM  Fall Risk   Falls in the past year? 0    Cognitive Function:    04/27/2022    9:29 AM  MMSE - Mini Mental State Exam  Orientation to time 5  Orientation to Place 5  Registration 3  Attention/ Calculation 5  Recall 2  Language- name 2 objects 2  Language- repeat 1  Language- follow 3 step command 3  Language- read & follow direction 1  Write a sentence 1  Copy design 1  Total score 29        Patient Care Team: Chevis Pretty, FNP as PCP - General (Family Medicine)     Plan:   Welcome to medicare  I have personally reviewed and noted the  following in the patient's chart:   Medical and social history Use of alcohol, tobacco or illicit drugs  Current medications and supplements Functional ability and status Nutritional status Physical activity Advanced directives List of other physicians Hospitalizations, surgeries, and ER visits in previous 12 months Vitals Screenings to include cognitive, depression, and falls Referrals and appointments  In addition, I have reviewed and discussed with patient certain preventive protocols, quality metrics, and best practice recommendations. A written personalized care plan for preventive services as well as general preventive health recommendations were provided to  patient.     [place], [place] 04/27/2022

## 2022-05-13 ENCOUNTER — Encounter: Payer: Self-pay | Admitting: Nurse Practitioner

## 2022-05-13 ENCOUNTER — Telehealth (INDEPENDENT_AMBULATORY_CARE_PROVIDER_SITE_OTHER): Payer: Medicare HMO | Admitting: Nurse Practitioner

## 2022-05-13 DIAGNOSIS — J069 Acute upper respiratory infection, unspecified: Secondary | ICD-10-CM | POA: Diagnosis not present

## 2022-05-13 MED ORDER — AZITHROMYCIN 250 MG PO TABS: ORAL_TABLET | ORAL | 0 refills | Status: DC

## 2022-05-13 NOTE — Progress Notes (Signed)
Virtual Visit Consent   Toni Francis, you are scheduled for a virtual visit with Mary-Margaret Hassell Done, [place], a [facility] provider, today.     Just as with appointments in the office, your consent must be obtained to participate.  Your consent will be active for this visit and any virtual visit you may have with one of our providers in the next 365 days.     If you have a MyChart account, a copy of this consent can be sent to you electronically.  All virtual visits are billed to your insurance company just like a traditional visit in the office.    As this is a virtual visit, video technology does not allow for your provider to perform a traditional examination.  This may limit your provider's ability to fully assess your condition.  If your provider identifies any concerns that need to be evaluated in person or the need to arrange testing (such as labs, EKG, etc.), we will make arrangements to do so.     Although advances in technology are sophisticated, we cannot ensure that it will always work on either your end or our end.  If the connection with a video visit is poor, the visit may have to be switched to a telephone visit.  With either a video or telephone visit, we are not always able to ensure that we have a secure connection.     I need to obtain your verbal consent now.   Are you willing to proceed with your visit today? YES   Toni Francis has provided verbal consent on 05/13/2022 for a virtual visit (video or telephone).   Mary-Margaret Hassell Done, FNP   Date: 05/13/2022 10:13 AM   Virtual Visit via Video Note   I, Mary-Margaret Hassell Done, connected with Toni Francis ([id number], 1956-02-16) on 05/13/22 at 12:15 PM EST by a video-enabled telemedicine application and verified that I am speaking with the correct person using two identifiers.  Location: Patient: Virtual Visit Location Patient: Home Provider: Virtual Visit Location Provider: Mobile   I discussed the limitations of  evaluation and management by telemedicine and the availability of in person appointments. The patient expressed understanding and agreed to proceed.    History of Present Illness: Toni Francis is a 66 y.o. who identifies as a female who was assigned female at birth, and is being seen today for congestion.  HPI: URI  This is a new problem. The current episode started 1 to 4 weeks ago. The problem has been waxing and waning. There has been no fever. Associated symptoms include congestion, coughing, ear pain, headaches and rhinorrhea. She has tried acetaminophen for the symptoms. The treatment provided mild relief.    Review of Systems  HENT:  Positive for congestion, ear pain and rhinorrhea.   Respiratory:  Positive for cough.   Neurological:  Positive for headaches.    Problems:  Patient Active Problem List   Diagnosis Date Noted   Hypothyroidism 01/12/2021   GERD (gastroesophageal reflux disease) 10/08/2020   Morbid obesity ([place]) 03/06/2020   Hypertension    History of thyroid cancer 08/13/2019   Allergic rhinitis 10/14/2015    Allergies:  Allergies  Allergen Reactions   Ampicillin Rash    Did it involve swelling of the face/tongue/throat, SOB, or low BP? No Did it involve sudden or severe rash/hives, skin peeling, or any reaction on the inside of your mouth or nose? Yes Did you need to seek medical attention at a hospital or  doctor's office? Yes When did it last happen?      10 + years If all above answers are "NO", may proceed with cephalosporin use.    Codeine Nausea And Vomiting and Other (See Comments)    Also causes headaches    Pseudoephedrine Palpitations   Medications:  Current Outpatient Medications:    acetaminophen (TYLENOL) 500 MG tablet, Take 1,000 mg by mouth daily as needed for moderate pain or headache., Disp: , Rfl:    Ascorbic Acid (VITAMIN C) 500 MG CAPS, Take by mouth., Disp: , Rfl:    fluticasone (FLONASE) 50 MCG/ACT nasal spray, Place 2 sprays into  both nostrils daily. USE 2 SPRAY(S) IN EACH NOSTRIL TWICE DAILY, Disp: 48 g, Rfl: 1   levocetirizine (XYZAL) 5 MG tablet, TAKE 1 TABLET BY MOUTH ONCE DAILY IN THE EVENING, Disp: 90 tablet, Rfl: 2   levothyroxine (SYNTHROID) 75 MCG tablet, Take 1 tablet (75 mcg total) by mouth daily., Disp: 90 tablet, Rfl: 1   meloxicam (MOBIC) 15 MG tablet, Take 1 tablet (15 mg total) by mouth daily., Disp: 90 tablet, Rfl: 1   Multiple Vitamins-Minerals (CENTRUM SILVER PO), Take 1 tablet by mouth daily., Disp: , Rfl:    Multiple Vitamins-Minerals (EYE VITAMINS PO), Take 1 capsule by mouth daily., Disp: , Rfl:    Nutritional Supplements (VITAMIN D BOOSTER PO), Take by mouth., Disp: , Rfl:    Omega-3 Fatty Acids (FISH OIL PO), Take 1 capsule by mouth daily. , Disp: , Rfl:    omeprazole (PRILOSEC) 20 MG capsule, Take 1 capsule (20 mg total) by mouth daily., Disp: 90 capsule, Rfl: 1   triamterene-hydrochlorothiazide (MAXZIDE-25) 37.5-25 MG tablet, Take 1 tablet by mouth daily. For blood pressure and fluid, Disp: 90 tablet, Rfl: 1   ZINC OXIDE PO, Take by mouth., Disp: , Rfl:   Current Facility-Administered Medications:    promethazine (PHENERGAN) injection 25 mg, 25 mg, Intramuscular, Q6H PRN, Baruch Gouty, FNP, 25 mg at 09/30/21 1545  Observations/Objective: Patient is well-developed, well-nourished in no acute distress.  Resting comfortably  at home.  Head is normocephalic, atraumatic.  No labored breathing.  Speech is clear and coherent with logical content.  Patient is alert and oriented at baseline.  Raspy voice   Assessment and Plan:  Toni Francis in today with chief complaint of Nasal Congestion   1. URI with cough and congestion 1. Take meds as prescribed 2. Use a cool mist humidifier especially during the winter months and when heat has been humid. 3. Use saline nose sprays frequently 4. Saline irrigations of the nose can be very helpful if done frequently.  * 4X daily for 1 week*  * Use  of a nettie pot can be helpful with this. Follow directions with this* 5. Drink plenty of fluids 6. Keep thermostat turn down low 7.For any cough or congestion- mucinex 8. For fever or aces or pains- take tylenol or ibuprofen appropriate for age and weight.  * for fevers greater than 101 orally you may alternate ibuprofen and tylenol every  3 hours.    - azithromycin (ZITHROMAX Z-PAK) 250 MG tablet; As directed  Dispense: 6 tablet; Refill: 0    Follow Up Instructions: I discussed the assessment and treatment plan with the patient. The patient was provided an opportunity to ask questions and all were answered. The patient agreed with the plan and demonstrated an understanding of the instructions.  A copy of instructions were sent to the patient via MyChart.  The  patient was advised to call back or seek an in-person evaluation if the symptoms worsen or if the condition fails to improve as anticipated.  Time:  I spent 6 minutes with the patient via telehealth technology discussing the above problems/concerns.    Mary-Margaret Hassell Done, FNP

## 2022-05-13 NOTE — Patient Instructions (Signed)

## 2022-05-20 ENCOUNTER — Other Ambulatory Visit: Payer: Self-pay | Admitting: Nurse Practitioner

## 2022-05-20 DIAGNOSIS — M1990 Unspecified osteoarthritis, unspecified site: Secondary | ICD-10-CM

## 2022-06-21 ENCOUNTER — Other Ambulatory Visit: Payer: Self-pay | Admitting: Pain Medicine

## 2022-06-21 DIAGNOSIS — M5416 Radiculopathy, lumbar region: Secondary | ICD-10-CM

## 2022-06-24 DIAGNOSIS — Z6841 Body Mass Index (BMI) 40.0 and over, adult: Secondary | ICD-10-CM | POA: Diagnosis not present

## 2022-06-24 DIAGNOSIS — M5416 Radiculopathy, lumbar region: Secondary | ICD-10-CM | POA: Diagnosis not present

## 2022-06-30 DIAGNOSIS — M5416 Radiculopathy, lumbar region: Secondary | ICD-10-CM | POA: Diagnosis not present

## 2022-07-14 ENCOUNTER — Ambulatory Visit
Admission: RE | Admit: 2022-07-14 | Discharge: 2022-07-14 | Disposition: A | Discharge status: Home / Self Care | Payer: Medicare HMO | Source: Ambulatory Visit | Attending: Pain Medicine | Admitting: Pain Medicine

## 2022-07-14 DIAGNOSIS — M4316 Spondylolisthesis, lumbar region: Secondary | ICD-10-CM | POA: Diagnosis not present

## 2022-07-14 DIAGNOSIS — M5416 Radiculopathy, lumbar region: Secondary | ICD-10-CM

## 2022-07-14 DIAGNOSIS — M48061 Spinal stenosis, lumbar region without neurogenic claudication: Secondary | ICD-10-CM | POA: Diagnosis not present

## 2022-07-14 DIAGNOSIS — M545 Low back pain, unspecified: Secondary | ICD-10-CM | POA: Diagnosis not present

## 2022-08-02 DIAGNOSIS — M5416 Radiculopathy, lumbar region: Secondary | ICD-10-CM | POA: Diagnosis not present

## 2022-08-05 DIAGNOSIS — H2513 Age-related nuclear cataract, bilateral: Secondary | ICD-10-CM | POA: Diagnosis not present

## 2022-08-05 DIAGNOSIS — H02834 Dermatochalasis of left upper eyelid: Secondary | ICD-10-CM | POA: Diagnosis not present

## 2022-08-05 DIAGNOSIS — H1045 Other chronic allergic conjunctivitis: Secondary | ICD-10-CM | POA: Diagnosis not present

## 2022-08-05 DIAGNOSIS — H02831 Dermatochalasis of right upper eyelid: Secondary | ICD-10-CM | POA: Diagnosis not present

## 2022-08-05 DIAGNOSIS — H0102B Squamous blepharitis left eye, upper and lower eyelids: Secondary | ICD-10-CM | POA: Diagnosis not present

## 2022-08-05 DIAGNOSIS — H0102A Squamous blepharitis right eye, upper and lower eyelids: Secondary | ICD-10-CM | POA: Diagnosis not present

## 2022-08-11 DIAGNOSIS — Z1231 Encounter for screening mammogram for malignant neoplasm of breast: Secondary | ICD-10-CM | POA: Diagnosis not present

## 2022-08-12 LAB — HM MAMMOGRAPHY

## 2022-08-20 ENCOUNTER — Encounter: Payer: Self-pay | Admitting: Obstetrics and Gynecology

## 2022-08-20 ENCOUNTER — Other Ambulatory Visit: Payer: Self-pay | Admitting: Obstetrics and Gynecology

## 2022-08-20 DIAGNOSIS — R928 Other abnormal and inconclusive findings on diagnostic imaging of breast: Secondary | ICD-10-CM

## 2022-08-26 ENCOUNTER — Telehealth (INDEPENDENT_AMBULATORY_CARE_PROVIDER_SITE_OTHER): Payer: Medicare HMO | Admitting: Family Medicine

## 2022-08-26 ENCOUNTER — Encounter: Payer: Self-pay | Admitting: Family Medicine

## 2022-08-26 DIAGNOSIS — M5416 Radiculopathy, lumbar region: Secondary | ICD-10-CM | POA: Diagnosis not present

## 2022-08-26 MED ORDER — PREDNISONE 20 MG PO TABS
40.0000 mg | ORAL_TABLET | Freq: Every day | ORAL | 0 refills | Status: AC
Start: 2022-08-26 — End: 2022-08-31

## 2022-08-26 NOTE — Progress Notes (Signed)
   Virtual Visit via video Note   Due to COVID-19 pandemic this visit was conducted virtually. This visit type was conducted due to national recommendations for restrictions regarding the COVID-19 Pandemic (e.g. social distancing, sheltering in place) in an effort to limit this patient's exposure and mitigate transmission in our community. All issues noted in this document were discussed and addressed.  A physical exam was not performed with this format.  I connected with  Toni Francis  on 08/26/22 at 12:51 by video and verified that I am speaking with the correct person using two identifiers. Toni Francis is currently located at home and no one is currently with her during the visit. The provider, Gabriel Earing, FNP is located in their office at time of visit.  I discussed the limitations, risks, security and privacy concerns of performing an evaluation and management service by video  and the availability of in person appointments. I also discussed with the patient that there may be a patient responsible charge related to this service. The patient expressed understanding and agreed to proceed.  CC: leg pain  History and Present Illness:  Toni Francis has a history of lumbar radiculopathy. She is established with neurosurgery and had an epidural injection recently. She reports increased pain down the lateral portion of her right thigh to her right lower leg for 3 days. She reports that it a constant throbbing pain. She denies changes in bowel or bladder control. Denies fever, numbness, tingling, or saddle anesthesia. Denies erythema or swelling of leg. She also takes mobic, uses voltaren gel and gets toradol injections. Prednisone bursts have been helpful in the past.    ROS As per HPI.     Observations/Objective: Alert and oriented. Respirations unlabored. No cyanosis. Non toxic appearing. Normal mood and behavior.   Assessment and Plan: Toni Francis was seen today for leg pain.  Diagnoses and  all orders for this visit:  Lumbar radiculopathy No red flags. Prednisone burst as below.  -     predniSONE (DELTASONE) 20 MG tablet; Take 2 tablets (40 mg total) by mouth daily with breakfast for 5 days.  Follow Up Instructions: Return to office for new or worsening symptoms, or if symptoms persist.     I discussed the assessment and treatment plan with the patient. The patient was provided an opportunity to ask questions and all were answered. The patient agreed with the plan and demonstrated an understanding of the instructions.   The patient was advised to call back or seek an in-person evaluation if the symptoms worsen or if the condition fails to improve as anticipated.  The above assessment and management plan was discussed with the patient. The patient verbalized understanding of and has agreed to the management plan. Patient is aware to call the clinic if symptoms persist or worsen. Patient is aware when to return to the clinic for a follow-up visit. Patient educated on when it is appropriate to go to the emergency department.   Time call ended: 1256  I provided 5 minutes of face-to-face time during this encounter.    Gabriel Earing, FNP

## 2022-08-31 ENCOUNTER — Ambulatory Visit
Admission: RE | Admit: 2022-08-31 | Discharge: 2022-08-31 | Disposition: A | Discharge status: Home / Self Care | Payer: Medicare HMO | Source: Ambulatory Visit | Attending: Obstetrics and Gynecology | Admitting: Obstetrics and Gynecology

## 2022-08-31 ENCOUNTER — Ambulatory Visit: Payer: Medicare HMO

## 2022-08-31 DIAGNOSIS — R928 Other abnormal and inconclusive findings on diagnostic imaging of breast: Secondary | ICD-10-CM

## 2022-08-31 DIAGNOSIS — R922 Inconclusive mammogram: Secondary | ICD-10-CM | POA: Diagnosis not present

## 2022-09-06 ENCOUNTER — Telehealth: Payer: Self-pay | Admitting: Nurse Practitioner

## 2022-09-06 ENCOUNTER — Ambulatory Visit (INDEPENDENT_AMBULATORY_CARE_PROVIDER_SITE_OTHER): Payer: Medicare HMO | Admitting: Nurse Practitioner

## 2022-09-06 VITALS — BP 127/87 | HR 75 | Ht 67.0 in | Wt 257.0 lb

## 2022-09-06 DIAGNOSIS — T753XXA Motion sickness, initial encounter: Secondary | ICD-10-CM

## 2022-09-06 DIAGNOSIS — M79604 Pain in right leg: Secondary | ICD-10-CM

## 2022-09-06 MED ORDER — PREDNISONE 20 MG PO TABS
40.0000 mg | ORAL_TABLET | Freq: Every day | ORAL | 0 refills | Status: AC
Start: 2022-09-06 — End: 2022-09-11

## 2022-09-06 MED ORDER — SCOPOLAMINE 1 MG/3DAYS TD PT72
1.0000 | MEDICATED_PATCH | TRANSDERMAL | 12 refills | Status: DC
Start: 2022-09-06 — End: 2022-10-25

## 2022-09-06 MED ORDER — CYCLOBENZAPRINE HCL 10 MG PO TABS: 10.0000 mg | ORAL_TABLET | Freq: Three times a day (TID) | ORAL | 0 refills | Status: DC | PRN

## 2022-09-06 MED ORDER — KETOROLAC TROMETHAMINE 60 MG/2ML IM SOLN
60.0000 mg | Freq: Once | INTRAMUSCULAR | Status: AC
Start: 2022-09-06 — End: 2022-09-06
  Administered 2022-09-06: 60 mg via INTRAMUSCULAR

## 2022-09-06 MED ORDER — METHYLPREDNISOLONE ACETATE 80 MG/ML IJ SUSP
80.0000 mg | Freq: Once | INTRAMUSCULAR | Status: AC
Start: 2022-09-06 — End: 2022-09-06
  Administered 2022-09-06: 80 mg via INTRAMUSCULAR

## 2022-09-06 NOTE — Telephone Encounter (Signed)
Muscle relaxer sent to [facility]

## 2022-09-06 NOTE — Telephone Encounter (Signed)
Patient said she was supposed to have a muscle relaxer called in to the pharmacy. Said she spoke to the nurse about it at her appt on 5/13. Please call back with any questions

## 2022-09-06 NOTE — Progress Notes (Signed)
Subjective:    Patient ID: Toni Francis, female    DOB: [birth year], 67 y.o.   MRN: [id number]   Chief Complaint: Leg Pain (Right, entire leg. Started 3w ago)   Leg Pain  There was no injury mechanism. The pain is present in the right leg. The quality of the pain is described as aching and stabbing. The pain is at a severity of 8/10. The pain is severe. The pain has been Constant since onset. Associated symptoms include an inability to bear weight. The symptoms are aggravated by movement and weight bearing. Treatments tried: gets shots evry 3 months from [facility] [employer] specialist. The treatment provided mild relief.   Patient going on a cruise to [place] on May 25  Patient Active Problem List   Diagnosis Date Noted   Hypothyroidism 01/12/2021   GERD (gastroesophageal reflux disease) 10/08/2020   Morbid obesity ([facility]) 03/06/2020   Hypertension    History of thyroid cancer 08/13/2019   Allergic rhinitis 10/14/2015       Review of Systems  Constitutional:  Negative for diaphoresis.  Eyes:  Negative for pain.  Respiratory:  Negative for shortness of breath.   Cardiovascular:  Negative for chest pain, palpitations and leg swelling.  Gastrointestinal:  Negative for abdominal pain.  Endocrine: Negative for polydipsia.  Skin:  Negative for rash.  Neurological:  Negative for dizziness, weakness and headaches.  Hematological:  Does not bruise/bleed easily.  All other systems reviewed and are negative.      Objective:   Physical Exam Vitals reviewed.  Constitutional:      Appearance: Normal appearance.  Cardiovascular:     Rate and Rhythm: Normal rate and regular rhythm.     Heart sounds: Normal heart sounds.  Musculoskeletal:     Comments: FROM of right leg with pain on full flexion NO back pain with movement.  Skin:    General: Skin is warm.  Neurological:     General: No focal deficit present.     Mental Status: She is alert and oriented to person, place, and time.   Psychiatric:        Mood and Affect: Mood normal.        Behavior: Behavior normal.    BP 127/87   Pulse 75   Ht 5\' 7"  (1.702 m)   Wt 257 lb (116.6 kg)   SpO2 97%   BMI 40.25 kg/m         Assessment & Plan:   Toni Francis in today with chief complaint of Leg Pain (Right, entire leg. Started 3w ago)   1. Right leg pain Stretches Discussed oTC tens unit. - methylPREDNISolone acetate (DEPO-MEDROL) injection 80 mg - ketorolac (TORADOL) injection 60 mg - predniSONE (DELTASONE) 20 MG tablet; Take 2 tablets (40 mg total) by mouth daily with breakfast for 5 days. 2 po daily for 5 days  Dispense: 10 tablet; Refill: 0  2. Motion sickness, initial encounter Force fluids - scopolamine (TRANSDERM-SCOP) 1 MG/3DAYS; Place 1 patch (1.5 mg total) onto the skin every 3 (three) days.  Dispense: 10 patch; Refill: 12    The above assessment and management plan was discussed with the patient. The patient verbalized understanding of and has agreed to the management plan. Patient is aware to call the clinic if symptoms persist or worsen. Patient is aware when to return to the clinic for a follow-up visit. Patient educated on when it is appropriate to go to the emergency department.   Mary-Margaret Daphine Deutscher, FNP

## 2022-09-06 NOTE — Telephone Encounter (Signed)
Pt aware and verbalized understanding.  

## 2022-09-06 NOTE — Patient Instructions (Signed)
Sciatica  Sciatica is pain, weakness, tingling, or loss of feeling (numbness) along the sciatic nerve. The sciatic nerve starts in the lower back and goes down the back of each leg. Sciatica usually affects one side of the body. Sciatica usually goes away on its own or with treatment. Sometimes, sciatica may come back. What are the causes? This condition happens when the sciatic nerve is pinched or has pressure put on it. This may be caused by: A disk in between the bones of the spine bulging out too far (herniated disk). Changes in the spinal disks due to aging. A condition that affects a muscle in the butt. Extra bone growth near the sciatic nerve. A break (fracture) of the area between your hip bones (pelvis). Pregnancy. Tumor. This is rare. What increases the risk? You are more likely to develop this condition if you: Play sports that put pressure or stress on the spine. Have poor strength and ease of movement (flexibility). Have had a back injury or back surgery. Sit for long periods of time. Do activities that involve bending or lifting over and over again. Are very overweight (obese). What are the signs or symptoms? Symptoms can vary from mild to very bad. They may include: Any of these problems in the lower back, leg, hip, or butt: Mild tingling, loss of feeling, or dull aches. A burning feeling. Sharp pains. Loss of feeling in the back of the calf or the sole of the foot. Leg weakness. Very bad back pain that makes it hard to move. These symptoms may get worse when you cough, sneeze, or laugh. They may also get worse when you sit or stand for long periods of time. How is this treated? This condition often gets better without any treatment. However, treatment may include: Changing or cutting back on physical activity when you have pain. Exercising, including strengthening and stretching. Putting ice or heat on the affected area. Shots of medicines to relieve pain and  swelling or to relax your muscles. Surgery. Follow these instructions at home: Medicines Take over-the-counter and prescription medicines only as told by your doctor. Ask your doctor if you should avoid driving or using machines while you are taking your medicine. Managing pain     If told, put ice on the affected area. To do this: Put ice in a plastic bag. Place a towel between your skin and the bag. Leave the ice on for 20 minutes, 2-3 times a day. If your skin turns bright red, take off the ice right away to prevent skin damage. The risk of skin damage is higher if you cannot feel pain, heat, or cold. If told, put heat on the affected area. Do this as often as told by your doctor. Use the heat source that your doctor tells you to use, such as a moist heat pack or a heating pad. Place a towel between your skin and the heat source. Leave the heat on for 20-30 minutes. If your skin turns bright red, take off the heat right away to prevent burns. The risk of burns is higher if you cannot feel pain, heat, or cold. Activity  Return to your normal activities when your doctor says that it is safe. Avoid activities that make your symptoms worse. Take short rests during the day. When you rest for a long time, do some physical activity or stretching between periods of rest. Avoid sitting for a long time without moving. Get up and move around at least one time each   hour. Do exercises and stretches as told by your doctor. Do not lift anything that is heavier than 10 lb (4.5 kg). Avoid lifting heavy things even when you do not have symptoms. Avoid lifting heavy things over and over. When you lift objects, always lift in a way that is safe for your body. To do this, you should: Bend your knees. Keep the object close to your body. Avoid twisting. General instructions Stay at a healthy weight. Wear comfortable shoes that support your feet. Avoid wearing high heels. Avoid sleeping on a mattress  that is too soft or too hard. You might have less pain if you sleep on a mattress that is firm enough to support your back. Contact a doctor if: Your pain is not controlled by medicine. Your pain does not get better. Your pain gets worse. Your pain lasts longer than 4 weeks. You lose weight without trying. Get help right away if: You cannot control when you pee (urinate) or poop (have a bowel movement). You have weakness in any of these areas and it gets worse: Lower back. The area between your hip bones. Butt. Legs. You have redness or swelling of your back. You have a burning feeling when you pee. Summary Sciatica is pain, weakness, tingling, or loss of feeling (numbness) along the sciatic nerve. This may include the lower back, legs, hips, and butt. This condition happens when the sciatic nerve is pinched or has pressure put on it. Treatment often includes rest, exercise, medicines, and putting ice or heat on the affected area. This information is not intended to replace advice given to you by your health care provider. Make sure you discuss any questions you have with your health care provider. Document Revised: 07/20/2021 Document Reviewed: 07/20/2021 Elsevier Patient Education  2023 Elsevier Inc.  

## 2022-09-06 NOTE — Telephone Encounter (Signed)
MMM, I do not see any mention in your office notes of a muscle relaxant.  Is patient mistaken?

## 2022-09-09 ENCOUNTER — Telehealth: Payer: Self-pay | Admitting: Family Medicine

## 2022-09-10 NOTE — Telephone Encounter (Signed)
Effective dates: 04/26/22-04/26/23

## 2022-09-13 NOTE — Telephone Encounter (Signed)
Toni Francis (Key: [id number]) PA Case ID #: [id number] Rx #: [id number] Need Help? Call us at [phone] Outcome Approved on May 16 Your request has been approved Authorization Expiration Date: 04/26/2023 Drug Scopolamine 1MG /3DAYS 72 hr patches ePA cloud Psychologist, educational Medicare Electronic [place] Form [id number] NCPDP)  Pharmacy informed

## 2022-10-04 DIAGNOSIS — I129 Hypertensive chronic kidney disease with stage 1 through stage 4 chronic kidney disease, or unspecified chronic kidney disease: Secondary | ICD-10-CM | POA: Diagnosis not present

## 2022-10-04 DIAGNOSIS — J301 Allergic rhinitis due to pollen: Secondary | ICD-10-CM | POA: Diagnosis not present

## 2022-10-04 DIAGNOSIS — M545 Low back pain, unspecified: Secondary | ICD-10-CM | POA: Diagnosis not present

## 2022-10-04 DIAGNOSIS — Z6839 Body mass index (BMI) 39.0-39.9, adult: Secondary | ICD-10-CM | POA: Diagnosis not present

## 2022-10-04 DIAGNOSIS — K219 Gastro-esophageal reflux disease without esophagitis: Secondary | ICD-10-CM | POA: Diagnosis not present

## 2022-10-04 DIAGNOSIS — Z791 Long term (current) use of non-steroidal anti-inflammatories (NSAID): Secondary | ICD-10-CM | POA: Diagnosis not present

## 2022-10-04 DIAGNOSIS — E039 Hypothyroidism, unspecified: Secondary | ICD-10-CM | POA: Diagnosis not present

## 2022-10-04 DIAGNOSIS — M62838 Other muscle spasm: Secondary | ICD-10-CM | POA: Diagnosis not present

## 2022-10-04 DIAGNOSIS — M1990 Unspecified osteoarthritis, unspecified site: Secondary | ICD-10-CM | POA: Diagnosis not present

## 2022-10-04 DIAGNOSIS — N182 Chronic kidney disease, stage 2 (mild): Secondary | ICD-10-CM | POA: Diagnosis not present

## 2022-10-05 ENCOUNTER — Telehealth: Payer: Self-pay | Admitting: Nurse Practitioner

## 2022-10-05 NOTE — Telephone Encounter (Signed)
Pt called requesting that MMM refer her to see Dr Willia Craze at [facility] on [facility] in [place]. She wants a second opinion for her back pain because she says insurance doesn't want to pay for her medicine.

## 2022-10-06 ENCOUNTER — Other Ambulatory Visit: Payer: Self-pay | Admitting: Nurse Practitioner

## 2022-10-06 DIAGNOSIS — M5416 Radiculopathy, lumbar region: Secondary | ICD-10-CM

## 2022-10-06 NOTE — Telephone Encounter (Signed)
Referral done

## 2022-10-13 DIAGNOSIS — M5416 Radiculopathy, lumbar region: Secondary | ICD-10-CM | POA: Diagnosis not present

## 2022-10-25 ENCOUNTER — Encounter: Payer: Self-pay | Admitting: Nurse Practitioner

## 2022-10-25 ENCOUNTER — Other Ambulatory Visit: Payer: Self-pay | Admitting: Nurse Practitioner

## 2022-10-25 ENCOUNTER — Ambulatory Visit (INDEPENDENT_AMBULATORY_CARE_PROVIDER_SITE_OTHER): Payer: Medicare HMO | Admitting: Nurse Practitioner

## 2022-10-25 ENCOUNTER — Other Ambulatory Visit: Payer: Self-pay

## 2022-10-25 ENCOUNTER — Ambulatory Visit (INDEPENDENT_AMBULATORY_CARE_PROVIDER_SITE_OTHER): Payer: Medicare HMO

## 2022-10-25 VITALS — BP 124/76 | HR 67 | Temp 97.7°F | Resp 20 | Ht 67.0 in | Wt 253.0 lb

## 2022-10-25 DIAGNOSIS — J301 Allergic rhinitis due to pollen: Secondary | ICD-10-CM | POA: Diagnosis not present

## 2022-10-25 DIAGNOSIS — K219 Gastro-esophageal reflux disease without esophagitis: Secondary | ICD-10-CM | POA: Diagnosis not present

## 2022-10-25 DIAGNOSIS — E890 Postprocedural hypothyroidism: Secondary | ICD-10-CM

## 2022-10-25 DIAGNOSIS — Z6839 Body mass index (BMI) 39.0-39.9, adult: Secondary | ICD-10-CM | POA: Diagnosis not present

## 2022-10-25 DIAGNOSIS — Z9889 Other specified postprocedural states: Secondary | ICD-10-CM | POA: Diagnosis not present

## 2022-10-25 DIAGNOSIS — M5416 Radiculopathy, lumbar region: Secondary | ICD-10-CM

## 2022-10-25 DIAGNOSIS — Z780 Asymptomatic menopausal state: Secondary | ICD-10-CM

## 2022-10-25 DIAGNOSIS — I10 Essential (primary) hypertension: Secondary | ICD-10-CM

## 2022-10-25 DIAGNOSIS — Z13820 Encounter for screening for osteoporosis: Secondary | ICD-10-CM

## 2022-10-25 LAB — CBC WITH DIFFERENTIAL/PLATELET
Basophils Absolute: 0.1 10*3/uL (ref 0.0–0.2)
Monocytes Absolute: 0.9 10*3/uL (ref 0.1–0.9)
Platelets: 280 10*3/uL (ref 150–450)
WBC: 10.1 10*3/uL (ref 3.4–10.8)

## 2022-10-25 LAB — LIPID PANEL
Chol/HDL Ratio: 3.1 ratio (ref 0.0–4.4)
Cholesterol, Total: 171 mg/dL (ref 100–199)

## 2022-10-25 LAB — CMP14+EGFR
Alkaline Phosphatase: 79 IU/L (ref 44–121)
Glucose: 87 mg/dL (ref 70–99)

## 2022-10-25 LAB — THYROID PANEL WITH TSH

## 2022-10-25 MED ORDER — FLUTICASONE PROPIONATE 50 MCG/ACT NA SUSP
2.0000 | Freq: Every day | NASAL | 1 refills | Status: DC
Start: 2022-10-25 — End: 2023-09-16

## 2022-10-25 MED ORDER — OMEPRAZOLE 20 MG PO CPDR
20.0000 mg | DELAYED_RELEASE_CAPSULE | Freq: Every day | ORAL | 1 refills | Status: DC
Start: 2022-10-25 — End: 2023-05-02

## 2022-10-25 MED ORDER — LEVOTHYROXINE SODIUM 75 MCG PO TABS
75.0000 ug | ORAL_TABLET | Freq: Every day | ORAL | 1 refills | Status: DC
Start: 2022-10-25 — End: 2023-12-01

## 2022-10-25 MED ORDER — TRIAMTERENE-HCTZ 37.5-25 MG PO TABS
1.0000 | ORAL_TABLET | Freq: Every day | ORAL | 1 refills | Status: DC
Start: 2022-10-25 — End: 2023-04-29

## 2022-10-25 MED ORDER — FLUTICASONE PROPIONATE 50 MCG/ACT NA SUSP
2.0000 | Freq: Every day | NASAL | 1 refills | Status: DC
Start: 2022-10-25 — End: 2022-10-25

## 2022-10-25 NOTE — Patient Instructions (Signed)
Allergic Rhinitis, Adult  Allergic rhinitis is a reaction to allergens. Allergens are things that can cause an allergic reaction. This condition affects the lining inside the nose (mucous membrane). There are two types of allergic rhinitis: Seasonal. This type is also called hay fever. It happens only during some times of the year. Perennial. This type can happen at any time of the year. This condition cannot be spread from person to person (is not contagious). It can be mild, bad, or very bad. It can develop at any age and may be outgrown. What are the causes? Pollen from grasses, trees, and weeds. Other causes can be: Dust mites. Smoke. Mold. Car fumes. The pee (urine), spit, or dander of pets. Dander is dead skin cells from a pet. What increases the risk? You are more likely to develop this condition if: You have allergies in your family. You have problems like allergies in your family. You may have: Swelling of parts of your eyes and eyelids. Asthma. This affects how you breathe. Long-term redness and swelling on your skin. Food allergies. What are the signs or symptoms? The main symptom of this condition is a runny or stuffy nose (nasal congestion). Other symptoms may include: Sneezing or coughing. Itching and tearing of your eyes. Mucus that drips down the back of your throat (postnasal drip). This may cause a sore throat. Trouble sleeping. Feeling tired. Headache. How is this treated? There is no cure for this condition. You should avoid things that you are allergic to. Treatment can help to relieve symptoms. This may include: Medicines that block allergy symptoms, such as anti-inflammatories or antihistamines. These may be given as a shot, nasal spray, or pill. Avoiding things you are allergic to. Medicines that give you some of what you are allergic to over time. This is called immunotherapy. It is done if other treatments do not help. You may get: Shots. Medicine under  your tongue. Stronger medicines, if other treatments do not help. Follow these instructions at home: Avoiding allergens Find out what things you are allergic to and avoid them. To do this, try these things: If you get allergies any time of year: Replace carpet with wood, tile, or vinyl flooring. Carpet can trap pet dander and dust. Do not smoke. Do not allow smoking in your home. Change your heating and air conditioning filters at least once a month. If you get allergies only some times of the year: Keep windows closed when you can. Plan things to do outside when pollen counts are lowest. Check pollen counts before you plan things to do outside. When you come indoors, change your clothes and shower before you sit on furniture or bedding. If you are allergic to a pet: Keep the pet out of your bedroom. Vacuum, sweep, and dust often. General instructions Take over-the-counter and prescription medicines only as told by your doctor. Drink enough fluid to keep your pee pale yellow. Where to find more information American Academy of Allergy, Asthma & Immunology: aaaai.org Contact a doctor if: You have a fever. You get a cough that does not go away. You make high-pitched whistling sounds when you breathe, most often when you breathe out (wheeze). Your symptoms slow you down. Your symptoms stop you from doing your normal things each day. Get help right away if: You are short of breath. This symptom may be an emergency. Get help right away. Call 911. Do not wait to see if the symptom will go away. Do not drive yourself to the   hospital. This information is not intended to replace advice given to you by your health care provider. Make sure you discuss any questions you have with your health care provider. Document Revised: 12/21/2021 Document Reviewed: 12/21/2021 Elsevier Patient Education  2024 Elsevier Inc.  

## 2022-10-25 NOTE — Progress Notes (Signed)
Subjective:    Patient ID: Toni Francis, female    DOB: 05/24/1955, 67 y.o.   MRN: [id number]   Chief Complaint: medical management of chronic issues     HPI:  Toni Francis is a 67 y.o. who identifies as a female who was assigned female at birth.   Social history: Lives with: dad and brother Work history: retired   Water engineer in today for follow up of the following chronic medical issues:  1. Primary hypertension No c/o chest pain, sob or headache. Does not check blood pressure at home very often. BP Readings from Last 3 Encounters:  09/06/22 127/87  04/27/22 121/82  04/22/22 (!) 160/94      2. Postoperative hypothyroidism No problems that she is aware of Lab Results  Component Value Date   TSH 4.180 04/22/2022     3. Gastroesophageal reflux disease without esophagitis Is on omperazole daily and is doing well.  4. Seasonal allergic rhinitis due to pollen Use flonase and xyzal daily.   5. Morbid obesity (HCC) Weight is down 4lbs Wt Readings from Last 3 Encounters:  10/25/22 253 lb (114.8 kg)  09/06/22 257 lb (116.6 kg)  04/27/22 256 lb (116.1 kg)   BMI Readings from Last 3 Encounters:  10/25/22 39.63 kg/m  09/06/22 40.25 kg/m  04/27/22 40.10 kg/m     New complaints: Is still having chronic back pain. Is waiting on an appointment for second opinion.  Allergies  Allergen Reactions   Ampicillin Rash    Did it involve swelling of the face/tongue/throat, SOB, or low BP? No Did it involve sudden or severe rash/hives, skin peeling, or any reaction on the inside of your mouth or nose? Yes Did you need to seek medical attention at a hospital or doctor's office? Yes When did it last happen?      10 + years If all above answers are "NO", may proceed with cephalosporin use.    Codeine Nausea And Vomiting and Other (See Comments)    Also causes headaches    Pseudoephedrine Palpitations   Outpatient Encounter Medications as of 10/25/2022  Medication Sig    acetaminophen (TYLENOL) 500 MG tablet Take 1,000 mg by mouth daily as needed for moderate pain or headache.   Ascorbic Acid (VITAMIN C) 500 MG CAPS Take by mouth.   cyclobenzaprine (FLEXERIL) 10 MG tablet Take 1 tablet (10 mg total) by mouth 3 (three) times daily as needed for muscle spasms.   fluticasone (FLONASE) 50 MCG/ACT nasal spray Place 2 sprays into both nostrils daily. USE 2 SPRAY(S) IN EACH NOSTRIL TWICE DAILY   levocetirizine (XYZAL) 5 MG tablet TAKE 1 TABLET BY MOUTH ONCE DAILY IN THE EVENING   levothyroxine (SYNTHROID) 75 MCG tablet Take 1 tablet (75 mcg total) by mouth daily.   meloxicam (MOBIC) 15 MG tablet Take 1 tablet by mouth once daily   Multiple Vitamins-Minerals (CENTRUM SILVER PO) Take 1 tablet by mouth daily.   Multiple Vitamins-Minerals (EYE VITAMINS PO) Take 1 capsule by mouth daily.   Nutritional Supplements (VITAMIN D BOOSTER PO) Take by mouth.   Omega-3 Fatty Acids (FISH OIL PO) Take 1 capsule by mouth daily.    omeprazole (PRILOSEC) 20 MG capsule Take 1 capsule (20 mg total) by mouth daily.   scopolamine (TRANSDERM-SCOP) 1 MG/3DAYS Place 1 patch (1.5 mg total) onto the skin every 3 (three) days.   triamterene-hydrochlorothiazide (MAXZIDE-25) 37.5-25 MG tablet Take 1 tablet by mouth daily. For blood pressure and fluid   ZINC OXIDE  PO Take by mouth.   Facility-Administered Encounter Medications as of 10/25/2022  Medication   promethazine (PHENERGAN) injection 25 mg    Past Surgical History:  Procedure Laterality Date   CHOLECYSTECTOMY     THYROIDECTOMY Right 08/13/2019   Procedure: RIGHT THYROID LOBECTOMY;  Surgeon: Drema Halon, MD;  Location: [facility] OR;  Service: ENT;  Laterality: Right;    Family History  Problem Relation Age of Onset   Colon cancer Maternal Uncle 50      Controlled substance contract: n/a     Review of Systems  Constitutional:  Negative for diaphoresis.  Eyes:  Negative for pain.  Respiratory:  Negative for shortness of  breath.   Cardiovascular:  Negative for chest pain, palpitations and leg swelling.  Gastrointestinal:  Negative for abdominal pain.  Endocrine: Negative for polydipsia.  Skin:  Negative for rash.  Neurological:  Negative for dizziness, weakness and headaches.  Hematological:  Does not bruise/bleed easily.  All other systems reviewed and are negative.      Objective:   Physical Exam Vitals and nursing note reviewed.  Constitutional:      General: She is not in acute distress.    Appearance: Normal appearance. She is well-developed.  HENT:     Head: Normocephalic.     Right Ear: Tympanic membrane normal.     Left Ear: Tympanic membrane normal.     Nose: Nose normal.     Mouth/Throat:     Mouth: Mucous membranes are moist.  Eyes:     Pupils: Pupils are equal, round, and reactive to light.  Neck:     Vascular: No carotid bruit or JVD.  Cardiovascular:     Rate and Rhythm: Normal rate and regular rhythm.     Heart sounds: Normal heart sounds.  Pulmonary:     Effort: Pulmonary effort is normal. No respiratory distress.     Breath sounds: Normal breath sounds. No wheezing or rales.  Chest:     Chest wall: No tenderness.  Abdominal:     General: Bowel sounds are normal. There is no distension or abdominal bruit.     Palpations: Abdomen is soft. There is no hepatomegaly, splenomegaly, mass or pulsatile mass.     Tenderness: There is no abdominal tenderness.  Musculoskeletal:        General: Normal range of motion.     Cervical back: Normal range of motion and neck supple.  Lymphadenopathy:     Cervical: No cervical adenopathy.  Skin:    General: Skin is warm and dry.  Neurological:     Mental Status: She is alert and oriented to person, place, and time.     Deep Tendon Reflexes: Reflexes are normal and symmetric.  Psychiatric:        Behavior: Behavior normal.        Thought Content: Thought content normal.        Judgment: Judgment normal.     BP 124/76   Pulse 67    Temp 97.7 F (36.5 C) (Temporal)   Resp 20   Ht 5\' 7"  (1.702 m)   Wt 253 lb (114.8 kg)   SpO2 92%   BMI 39.63 kg/m        Assessment & Plan:   Toni Francis comes in today with chief complaint of Medical Management of Chronic Issues   Diagnosis and orders addressed:  1. Primary hypertension Low sodium diet - triamterene-hydrochlorothiazide (MAXZIDE-25) 37.5-25 MG tablet; Take 1 tablet by mouth daily. For  blood pressure and fluid  Dispense: 90 tablet; Refill: 1 - CBC with Differential/Platelet - CMP14+EGFR - Lipid panel  2. Postoperative hypothyroidism Labs oending - levothyroxine (SYNTHROID) 75 MCG tablet; Take 1 tablet (75 mcg total) by mouth daily.  Dispense: 90 tablet; Refill: 1 - Thyroid Panel With TSH  3. Gastroesophageal reflux disease without esophagitis Avoid spicy foods Do not eat 2 hours prior to bedtime - omeprazole (PRILOSEC) 20 MG capsule; Take 1 capsule (20 mg total) by mouth daily.  Dispense: 90 capsule; Refill: 1  4. Seasonal allergic rhinitis due to pollen Continue OTC meds  5. Morbid obesity (HCC) Discussed diet and exercise for person with BMI >25 Will recheck weight in 3-6 months   6. H/O colonoscopy - Ambulatory referral to Gastroenterology   Labs pending Health Maintenance reviewed Diet and exercise encouraged  Follow up plan: 6 months   Mary-Margaret Daphine Deutscher, FNP

## 2022-10-26 LAB — CBC WITH DIFFERENTIAL/PLATELET
Basos: 1 %
EOS (ABSOLUTE): 0.1 10*3/uL (ref 0.0–0.4)
Eos: 1 %
Hematocrit: 42.1 % (ref 34.0–46.6)
Hemoglobin: 13.6 g/dL (ref 11.1–15.9)
Immature Grans (Abs): 0.1 10*3/uL (ref 0.0–0.1)
Immature Granulocytes: 1 %
Lymphocytes Absolute: 1.5 10*3/uL (ref 0.7–3.1)
Lymphs: 15 %
MCH: 28.1 pg (ref 26.6–33.0)
MCHC: 32.3 g/dL (ref 31.5–35.7)
MCV: 87 fL (ref 79–97)
Monocytes: 9 %
Neutrophils Absolute: 7.4 10*3/uL — ABNORMAL HIGH (ref 1.4–7.0)
Neutrophils: 73 %
RBC: 4.84 x10E6/uL (ref 3.77–5.28)
RDW: 13.4 % (ref 11.7–15.4)

## 2022-10-26 LAB — CMP14+EGFR
ALT: 15 IU/L (ref 0–32)
AST: 12 IU/L (ref 0–40)
Albumin: 4.1 g/dL (ref 3.9–4.9)
BUN/Creatinine Ratio: 24 (ref 12–28)
BUN: 24 mg/dL (ref 8–27)
Bilirubin Total: 0.6 mg/dL (ref 0.0–1.2)
CO2: 24 mmol/L (ref 20–29)
Calcium: 9.6 mg/dL (ref 8.7–10.3)
Chloride: 109 mmol/L — ABNORMAL HIGH (ref 96–106)
Creatinine, Ser: 0.98 mg/dL (ref 0.57–1.00)
Globulin, Total: 2 g/dL (ref 1.5–4.5)
Potassium: 4.6 mmol/L (ref 3.5–5.2)
Sodium: 145 mmol/L — ABNORMAL HIGH (ref 134–144)
Total Protein: 6.1 g/dL (ref 6.0–8.5)
eGFR: 64 mL/min/{1.73_m2} (ref >59)

## 2022-10-26 LAB — THYROID PANEL WITH TSH
T3 Uptake Ratio: 30 % (ref 24–39)
T4, Total: 8.1 ug/dL (ref 4.5–12.0)
TSH: 4.16 u[IU]/mL (ref 0.450–4.500)

## 2022-10-26 LAB — LIPID PANEL
HDL: 55 mg/dL (ref >39)
LDL Chol Calc (NIH): 93 mg/dL (ref 0–99)
Triglycerides: 130 mg/dL (ref 0–149)
VLDL Cholesterol Cal: 23 mg/dL (ref 5–40)

## 2022-10-27 DIAGNOSIS — Z780 Asymptomatic menopausal state: Secondary | ICD-10-CM | POA: Diagnosis not present

## 2022-11-11 DIAGNOSIS — M5416 Radiculopathy, lumbar region: Secondary | ICD-10-CM | POA: Diagnosis not present

## 2022-11-24 ENCOUNTER — Ambulatory Visit: Payer: Medicare HMO | Admitting: Orthopedic Surgery

## 2022-11-30 ENCOUNTER — Telehealth: Payer: Self-pay | Admitting: Nurse Practitioner

## 2022-11-30 NOTE — Telephone Encounter (Signed)
Pt wants to know if MMM will work her in for a video visit today?

## 2022-12-01 ENCOUNTER — Encounter: Payer: Self-pay | Admitting: Nurse Practitioner

## 2022-12-01 ENCOUNTER — Ambulatory Visit (INDEPENDENT_AMBULATORY_CARE_PROVIDER_SITE_OTHER): Payer: Medicare HMO | Admitting: Nurse Practitioner

## 2022-12-01 ENCOUNTER — Ambulatory Visit (INDEPENDENT_AMBULATORY_CARE_PROVIDER_SITE_OTHER): Payer: Medicare HMO

## 2022-12-01 VITALS — BP 136/88 | HR 68 | Temp 97.4°F | Resp 20 | Ht 67.0 in | Wt 258.0 lb

## 2022-12-01 DIAGNOSIS — M79672 Pain in left foot: Secondary | ICD-10-CM

## 2022-12-01 DIAGNOSIS — M19072 Primary osteoarthritis, left ankle and foot: Secondary | ICD-10-CM | POA: Diagnosis not present

## 2022-12-01 DIAGNOSIS — M7732 Calcaneal spur, left foot: Secondary | ICD-10-CM | POA: Diagnosis not present

## 2022-12-01 DIAGNOSIS — M7662 Achilles tendinitis, left leg: Secondary | ICD-10-CM | POA: Diagnosis not present

## 2022-12-01 MED ORDER — KETOROLAC TROMETHAMINE 60 MG/2ML IM SOLN
60.0000 mg | Freq: Once | INTRAMUSCULAR | Status: AC
Start: 2022-12-01 — End: 2022-12-01
  Administered 2022-12-01: 60 mg via INTRAMUSCULAR

## 2022-12-01 MED ORDER — METHYLPREDNISOLONE ACETATE 80 MG/ML IJ SUSP
80.0000 mg | Freq: Once | INTRAMUSCULAR | Status: AC
Start: 2022-12-01 — End: 2022-12-01
  Administered 2022-12-01: 80 mg via INTRAMUSCULAR

## 2022-12-01 NOTE — Patient Instructions (Signed)
Heel Spur  A heel spur is a bony growth that forms on the bottom of the heel bone (calcaneus). Heel spurs are common. They often cause inflammation in the plantar fascia, which is the band of tissue that connects the toe bones to the heel bone. When the plantar fascia is inflamed, it is called plantar fasciitis. This may cause pain on the bottom of the foot, near the heel. Many people with plantar fasciitis also have heel spurs. However, spurs are not the cause of plantar fasciitis pain. What are the causes? The exact cause of heel spurs is not known. They may be caused by: Pressure on the heel bone. Bands of tissues that connect muscle to bone (tendons) pulling on the heel bone. What increases the risk? You are more likely to develop this condition if you: Are older than age 70. Are overweight. Have wear-and-tear arthritis (osteoarthritis). Have plantar fascia inflammation. Participate in sports or activities that include a lot of running or jumping. Wear poorly fitted shoes. What are the signs or symptoms? Some people have no symptoms. If you do have symptoms, they may include: Pain in the bottom of your heel. Pain that is worse when you first get out of bed. Pain that gets worse after walking or standing. How is this diagnosed? This condition may be diagnosed based on: Your symptoms and medical history. A physical exam. A foot X-ray. How is this treated? Treatment for this condition depends on how much pain you have. Treatment options may include: Doing stretching exercises and losing weight, if necessary. Wearing specific shoes or inserts inside of shoes (orthotics) for comfort and support. Wearing splints on your feet while you sleep. Splints keep your feet in a position (usually a 90-degree angle) that should prevent and relieve the pain you feel when you first get out of bed. They also make stretching easier in the morning. Taking over-the-counter medicine to relieve pain, such  as NSAIDs. Using high-intensity sound waves to break up the heel spur (extracorporeal shock wave therapy). Getting steroid injections in your heel to reduce inflammation. Having surgery, if your heel spur causes long-term (chronic) pain. Follow these instructions at home:  Activity Avoid activities that cause pain until you recover, or for as long as told by your health care provider. Do stretching exercises as told. Stretch before exercising or being physically active. Managing pain, stiffness, and swelling If directed, put ice on your foot. To do this: Put ice in a plastic bag. Place a towel between your skin and the bag. Leave the ice on for 20 minutes, 2-3 times a day. Remove the ice if your skin turns bright red. This is very important. If you cannot feel pain, heat, or cold, you have a greater risk of damage to the area. Move your toes often to reduce stiffness and swelling. When possible, raise (elevate) your foot above the level of your heart while you are sitting or lying down. General instructions Take over-the-counter and prescription medicines only as told by your health care provider. Wear supportive shoes that fit well. Wear splints, inserts, or orthotics as told by your health care provider. If recommended, work with your health care provider to lose weight. This can relieve pressure on your foot. Do not use any products that contain nicotine or tobacco, such as cigarettes, e-cigarettes, and chewing tobacco. These can delay bone healing. If you need help quitting, ask your health care provider. Keep all follow-up visits. This is important. Where to find more information [employer]  [employer]: www.orthoinfo.aaos.org Contact a health care provider if: Your pain does not go away with treatment. Your pain gets worse. Summary A heel spur is a bony growth that forms on the bottom of the heel bone (calcaneus). Heel spurs often cause inflammation in the plantar  fascia, which is the band of tissue that connects the toes to the heel bone. This may cause pain on the bottom of the foot, near the heel. Doing stretching exercises, losing weight, wearing specific shoes or shoe inserts, wearing splints while you sleep, and taking pain medicine may ease the pain and stiffness. Other treatment options may include high-intensity sound waves to break up the heel spur, steroid injections, or surgery. This information is not intended to replace advice given to you by your health care provider. Make sure you discuss any questions you have with your health care provider. Document Revised: 08/07/2019 Document Reviewed: 08/07/2019 Elsevier Patient Education  2024 [employer].

## 2022-12-01 NOTE — Progress Notes (Signed)
   Subjective:    Patient ID: Toni Francis, female    DOB: April 19, 1956, 67 y.o.   MRN: [id number]   Chief Complaint: left heel pain   Foot Pain Pertinent negatives include no abdominal pain, chest pain, diaphoresis, headaches, rash or weakness.    Left heel pain started last week. Rates pain 3/10 this morning. Walking ir standing increase pain. Pain is worse in mornings and improves throughout the day.  Patient Active Problem List   Diagnosis Date Noted   Hypothyroidism 01/12/2021   GERD (gastroesophageal reflux disease) 10/08/2020   Morbid obesity (HCC) 03/06/2020   Hypertension    History of thyroid cancer 08/13/2019   Allergic rhinitis 10/14/2015       Review of Systems  Constitutional:  Negative for diaphoresis.  Eyes:  Negative for pain.  Respiratory:  Negative for shortness of breath.   Cardiovascular:  Negative for chest pain, palpitations and leg swelling.  Gastrointestinal:  Negative for abdominal pain.  Endocrine: Negative for polydipsia.  Skin:  Negative for rash.  Neurological:  Negative for dizziness, weakness and headaches.  Hematological:  Does not bruise/bleed easily.  All other systems reviewed and are negative.      Objective:   Physical Exam Constitutional:      Appearance: Normal appearance.  Cardiovascular:     Rate and Rhythm: Normal rate and regular rhythm.     Heart sounds: Normal heart sounds.  Pulmonary:     Breath sounds: Normal breath sounds.  Musculoskeletal:     Comments: No left heel pain on palpation  Skin:    General: Skin is warm.  Neurological:     General: No focal deficit present.     Mental Status: She is alert and oriented to person, place, and time.  Psychiatric:        Mood and Affect: Mood normal.        Behavior: Behavior normal.    BP 136/88   Pulse 68   Temp (!) 97.4 F (36.3 C) (Temporal)   Resp 20   Ht 5\' 7"  (1.702 m)   Wt 258 lb (117 kg)   SpO2 98%   BMI 40.41 kg/m         Assessment & Plan:    Toni Francis in today with chief complaint of Foot Pain   1. Inflammatory heel pain, left Ise foot Heel cushions for shoes RTO prn - ketorolac (TORADOL) injection 60 mg - methylPREDNISolone acetate (DEPO-MEDROL) injection 80 mg - DG Foot Complete Left    The above assessment and management plan was discussed with the patient. The patient verbalized understanding of and has agreed to the management plan. Patient is aware to call the clinic if symptoms persist or worsen. Patient is aware when to return to the clinic for a follow-up visit. Patient educated on when it is appropriate to go to the emergency department.   Mary-Margaret Daphine Deutscher, FNP

## 2022-12-22 ENCOUNTER — Telehealth: Payer: Self-pay | Admitting: Nurse Practitioner

## 2022-12-22 NOTE — Telephone Encounter (Signed)
Pt called to schedule a health check and TB test tomorrow or Friday because [facility] wants her back at work. Explained to pt MMM would need to see her for this since she is PCP and she is not here the rest of the week. Pt says she can't do it next week because she will be out of town. Please advise.

## 2022-12-23 ENCOUNTER — Telehealth (INDEPENDENT_AMBULATORY_CARE_PROVIDER_SITE_OTHER): Payer: Medicare HMO | Admitting: Family Medicine

## 2022-12-23 ENCOUNTER — Encounter: Payer: Self-pay | Admitting: Family Medicine

## 2022-12-23 DIAGNOSIS — J0140 Acute pansinusitis, unspecified: Secondary | ICD-10-CM | POA: Diagnosis not present

## 2022-12-23 MED ORDER — CEFDINIR 300 MG PO CAPS
300.0000 mg | ORAL_CAPSULE | Freq: Two times a day (BID) | ORAL | 0 refills | Status: AC
Start: 2022-12-23 — End: 2022-12-30

## 2022-12-23 NOTE — Progress Notes (Signed)
   Virtual Visit via video Note   Due to COVID-19 pandemic this visit was conducted virtually. This visit type was conducted due to national recommendations for restrictions regarding the COVID-19 Pandemic (e.g. social distancing, sheltering in place) in an effort to limit this patient's exposure and mitigate transmission in our community. All issues noted in this document were discussed and addressed.  A physical exam was not performed with this format.  I connected with  Toni Francis  on 12/23/22 at 1304 by video and verified that I am speaking with the correct person using two identifiers. Toni Francis is currently located at home and no one is currently with her during the visit. The provider, Gabriel Earing, FNP is located in their office at time of visit.  I discussed the limitations, risks, security and privacy concerns of performing an evaluation and management service by video  and the availability of in person appointments. I also discussed with the patient that there may be a patient responsible charge related to this service. The patient expressed understanding and agreed to proceed.  CC: sinus infection  History and Present Illness:  Toni Francis reports sinus pressure and pain around her eyes and checks for the last 3 weeks. Symptoms have been worsening. She has nasal congestion and postnasal drip. She denies fever, chills, cough, sore throat, chest pain, shortness of breath. She has been taking flonase, antihistamines, and mucinex without improvement. Hx of bacterial sinus infections.    ROS As per HPI.   Observations/Objective: Alert and oriented. Respirations unlabored. No cyanosis. Non toxic appearing. Normal mood and behavior.     Assessment and Plan: Diagnoses and all orders for this visit:  Acute non-recurrent pansinusitis Discussed symptomatic care and return precautions.  -     cefdinir (OMNICEF) 300 MG capsule; Take 1 capsule (300 mg total) by mouth 2 (two) times  daily for 7 days. 1 po BID     Follow Up Instructions: As needed.     I discussed the assessment and treatment plan with the patient. The patient was provided an opportunity to ask questions and all were answered. The patient agreed with the plan and demonstrated an understanding of the instructions.   The patient was advised to call back or seek an in-person evaluation if the symptoms worsen or if the condition fails to improve as anticipated.  The above assessment and management plan was discussed with the patient. The patient verbalized understanding of and has agreed to the management plan. Patient is aware to call the clinic if symptoms persist or worsen. Patient is aware when to return to the clinic for a follow-up visit. Patient educated on when it is appropriate to go to the emergency department.   Time call ended: 1309  I provided 5 minutes of face-to-face time during this encounter.    Gabriel Earing, FNP

## 2022-12-23 NOTE — Telephone Encounter (Signed)
Pt stated she already made an appt

## 2022-12-24 DIAGNOSIS — B9689 Other specified bacterial agents as the cause of diseases classified elsewhere: Secondary | ICD-10-CM | POA: Diagnosis not present

## 2022-12-24 DIAGNOSIS — J019 Acute sinusitis, unspecified: Secondary | ICD-10-CM | POA: Diagnosis not present

## 2023-01-05 ENCOUNTER — Ambulatory Visit (AMBULATORY_SURGERY_CENTER): Payer: Medicare HMO

## 2023-01-05 ENCOUNTER — Encounter: Payer: Medicare HMO | Admitting: Gastroenterology

## 2023-01-05 VITALS — Ht 67.0 in | Wt 250.0 lb

## 2023-01-05 DIAGNOSIS — Z1211 Encounter for screening for malignant neoplasm of colon: Secondary | ICD-10-CM

## 2023-01-05 MED ORDER — NA SULFATE-K SULFATE-MG SULF 17.5-3.13-1.6 GM/177ML PO SOLN: 1.0000 | Freq: Once | ORAL | 0 refills | Status: AC

## 2023-01-05 NOTE — Progress Notes (Signed)

## 2023-01-06 ENCOUNTER — Other Ambulatory Visit: Payer: Self-pay

## 2023-01-06 ENCOUNTER — Ambulatory Visit (INDEPENDENT_AMBULATORY_CARE_PROVIDER_SITE_OTHER): Payer: Medicare HMO | Admitting: Nurse Practitioner

## 2023-01-06 ENCOUNTER — Telehealth: Payer: Self-pay | Admitting: Gastroenterology

## 2023-01-06 ENCOUNTER — Encounter: Payer: Self-pay | Admitting: Nurse Practitioner

## 2023-01-06 VITALS — BP 118/78 | HR 78 | Temp 97.2°F | Resp 20 | Ht 67.0 in | Wt 256.0 lb

## 2023-01-06 DIAGNOSIS — Z9289 Personal history of other medical treatment: Secondary | ICD-10-CM | POA: Diagnosis not present

## 2023-01-06 DIAGNOSIS — Z021 Encounter for pre-employment examination: Secondary | ICD-10-CM

## 2023-01-06 DIAGNOSIS — Z1211 Encounter for screening for malignant neoplasm of colon: Secondary | ICD-10-CM

## 2023-01-06 DIAGNOSIS — Z0289 Encounter for other administrative examinations: Secondary | ICD-10-CM

## 2023-01-06 DIAGNOSIS — Z23 Encounter for immunization: Secondary | ICD-10-CM

## 2023-01-06 MED ORDER — NA SULFATE-K SULFATE-MG SULF 17.5-3.13-1.6 GM/177ML PO SOLN
1.0000 | Freq: Once | ORAL | 0 refills | Status: AC
Start: 2023-01-06 — End: 2023-01-06

## 2023-01-06 MED ORDER — DOXYCYCLINE HYCLATE 100 MG PO TABS: 100.0000 mg | ORAL_TABLET | Freq: Two times a day (BID) | ORAL | 0 refills | Status: DC

## 2023-01-06 NOTE — Progress Notes (Signed)
Subjective:    Patient ID: HYLDA HUGO, female    DOB: Sep 29, 1955, 67 y.o.   MRN: [id number]   Chief Complaint: work physical  HPI  Patient has retired and is going to go back part time and needs a work physical. She is doing well. No complaints today. Patient Active Problem List   Diagnosis Date Noted   Hypothyroidism 01/12/2021   GERD (gastroesophageal reflux disease) 10/08/2020   Morbid obesity (HCC) 03/06/2020   Hypertension    History of thyroid cancer 08/13/2019   Allergic rhinitis 10/14/2015       Review of Systems  Constitutional:  Negative for diaphoresis.  Eyes:  Negative for pain.  Respiratory:  Negative for shortness of breath.   Cardiovascular:  Negative for chest pain, palpitations and leg swelling.  Gastrointestinal:  Negative for abdominal pain.  Endocrine: Negative for polydipsia.  Skin:  Negative for rash.  Neurological:  Negative for dizziness, weakness and headaches.  Hematological:  Does not bruise/bleed easily.  All other systems reviewed and are negative.      Objective:   Physical Exam Vitals and nursing note reviewed.  Constitutional:      General: She is not in acute distress.    Appearance: Normal appearance. She is well-developed.  HENT:     Head: Normocephalic.     Right Ear: Tympanic membrane normal.     Left Ear: Tympanic membrane normal.     Nose: Nose normal.     Mouth/Throat:     Mouth: Mucous membranes are moist.  Eyes:     Pupils: Pupils are equal, round, and reactive to light.  Neck:     Vascular: No carotid bruit or JVD.  Cardiovascular:     Rate and Rhythm: Normal rate and regular rhythm.     Heart sounds: Normal heart sounds.  Pulmonary:     Effort: Pulmonary effort is normal. No respiratory distress.     Breath sounds: Normal breath sounds. No wheezing or rales.  Chest:     Chest wall: No tenderness.  Abdominal:     General: Bowel sounds are normal. There is no distension or abdominal bruit.     Palpations:  Abdomen is soft. There is no hepatomegaly, splenomegaly, mass or pulsatile mass.     Tenderness: There is no abdominal tenderness.  Musculoskeletal:        General: Normal range of motion.     Cervical back: Normal range of motion and neck supple.  Lymphadenopathy:     Cervical: No cervical adenopathy.  Skin:    General: Skin is warm and dry.  Neurological:     Mental Status: She is alert and oriented to person, place, and time.     Deep Tendon Reflexes: Reflexes are normal and symmetric.  Psychiatric:        Behavior: Behavior normal.        Thought Content: Thought content normal.        Judgment: Judgment normal.     BP 118/78   Pulse 78   Temp (!) 97.2 F (36.2 C) (Temporal)   Resp 20   Ht 5\' 7"  (1.702 m)   Wt 256 lb (116.1 kg)   SpO2 95%   BMI 40.10 kg/m        Assessment & Plan:   IMMACOLATA WOLFERT comes in today with chief complaint of Annual Exam   Diagnosis and orders addressed:  1. Encounter for physical examination related to employment Released to return to work part  time  2. History of TB skin testing Needs testing - QuantiFERON-TB Gold Plus   Labs pending Health Maintenance reviewed Diet and exercise encouraged  Follow up plan: prn   Mary-Margaret Daphine Deutscher, FNP

## 2023-01-06 NOTE — Telephone Encounter (Signed)
Rx sent patient made aware. Goodrx coupon sent to pts email.

## 2023-01-06 NOTE — Telephone Encounter (Signed)
Inbound call from patient stating prep cost is 69$. Patient is wishing for prep to be sent to [facility] and to receive coupon that was discussed during yesterdays 9/11 PV. Patient requesting a call back to discuss further. Please advise, thank you.

## 2023-01-07 NOTE — Addendum Note (Signed)
Addended by: Cleda Daub on: 01/07/2023 04:57 PM   Modules accepted: Orders

## 2023-01-10 LAB — QUANTIFERON-TB GOLD PLUS
QuantiFERON Mitogen Value: >10 [IU]/mL
QuantiFERON Nil Value: 0.02 [IU]/mL
QuantiFERON TB1 Ag Value: 0.01 [IU]/mL
QuantiFERON TB2 Ag Value: 0.02 [IU]/mL
QuantiFERON-TB Gold Plus: NEGATIVE

## 2023-01-14 DIAGNOSIS — M5416 Radiculopathy, lumbar region: Secondary | ICD-10-CM | POA: Diagnosis not present

## 2023-01-20 ENCOUNTER — Other Ambulatory Visit: Payer: Self-pay | Admitting: Nurse Practitioner

## 2023-01-20 DIAGNOSIS — J301 Allergic rhinitis due to pollen: Secondary | ICD-10-CM

## 2023-01-24 ENCOUNTER — Other Ambulatory Visit: Payer: Self-pay | Admitting: Family Medicine

## 2023-01-24 ENCOUNTER — Ambulatory Visit (INDEPENDENT_AMBULATORY_CARE_PROVIDER_SITE_OTHER): Payer: Medicare HMO

## 2023-01-24 ENCOUNTER — Ambulatory Visit (INDEPENDENT_AMBULATORY_CARE_PROVIDER_SITE_OTHER): Payer: Medicare HMO | Admitting: Family Medicine

## 2023-01-24 ENCOUNTER — Encounter: Payer: Self-pay | Admitting: Family Medicine

## 2023-01-24 VITALS — BP 139/73 | HR 61 | Temp 98.2°F | Ht 67.0 in | Wt 258.1 lb

## 2023-01-24 DIAGNOSIS — M79671 Pain in right foot: Secondary | ICD-10-CM

## 2023-01-24 DIAGNOSIS — M7731 Calcaneal spur, right foot: Secondary | ICD-10-CM | POA: Diagnosis not present

## 2023-01-24 DIAGNOSIS — M7661 Achilles tendinitis, right leg: Secondary | ICD-10-CM | POA: Diagnosis not present

## 2023-01-24 NOTE — Progress Notes (Signed)
Acute Office Visit  Subjective:     Patient ID: Toni Francis, female    DOB: 1955/04/29, 67 y.o.   MRN: [id number]  Chief Complaint  Patient presents with   Foot Pain    Foot Pain This is a new problem. The current episode started in the past 7 days. The problem occurs intermittently. The problem has been waxing and waning.  Pain is sharp, below 5th toe on plantar surface. Sharp pain. Aggravated by weight bearing. She did have a fall 2-3 weeks ago but doesn't recall landing on this foot. Denies numbness, tingling, decreased ROM. She has been doing epsom salt soaks without improvement. She has been wearing supportive shoes with some relief.   ROS As per HPI.      Objective:    BP 139/73   Pulse 61   Temp 98.2 F (36.8 C) (Temporal)   Ht 5\' 7"  (1.702 m)   Wt 258 lb 2 oz (117.1 kg)   SpO2 96%   BMI 40.43 kg/m    Physical Exam Vitals and nursing note reviewed.  Constitutional:      General: She is not in acute distress.    Appearance: She is not ill-appearing, toxic-appearing or diaphoretic.  Pulmonary:     Effort: Pulmonary effort is normal. No respiratory distress.  Musculoskeletal:     Left foot: Normal range of motion and normal capillary refill. Bony tenderness (base of 5th metatarsal) present. No swelling, deformity, tenderness or crepitus.  Skin:    General: Skin is warm and dry.  Neurological:     Mental Status: She is alert and oriented to person, place, and time. Mental status is at baseline.  Psychiatric:        Mood and Affect: Mood normal.        Behavior: Behavior normal.     No results found for any visits on 01/24/23.      Assessment & Plan:   Leiah was seen today for foot pain.  Diagnoses and all orders for this visit:  Acute foot pain, right Bony tenderness. Xray today in office. No acute fracture seen - however radiology report is pending. Discussed RICE therapy and supportive shoes for now.  -     Cancel: DG Foot 2 Views Right;  Future  Return to office for new or worsening symptoms, or if symptoms persist.   The patient indicates understanding of these issues and agrees with the plan.  Gabriel Earing, FNP

## 2023-02-08 ENCOUNTER — Encounter: Payer: Self-pay | Admitting: Nurse Practitioner

## 2023-02-10 ENCOUNTER — Encounter: Payer: Self-pay | Admitting: Gastroenterology

## 2023-02-19 ENCOUNTER — Encounter: Payer: Self-pay | Admitting: Certified Registered Nurse Anesthetist

## 2023-02-21 ENCOUNTER — Encounter: Payer: Self-pay | Admitting: Nurse Practitioner

## 2023-02-21 ENCOUNTER — Ambulatory Visit: Payer: Medicare HMO | Admitting: Nurse Practitioner

## 2023-02-21 VITALS — BP 134/75 | HR 79 | Temp 97.1°F | Ht 67.0 in | Wt 257.8 lb

## 2023-02-21 DIAGNOSIS — L237 Allergic contact dermatitis due to plants, except food: Secondary | ICD-10-CM

## 2023-02-21 DIAGNOSIS — M25551 Pain in right hip: Secondary | ICD-10-CM

## 2023-02-21 DIAGNOSIS — M5416 Radiculopathy, lumbar region: Secondary | ICD-10-CM

## 2023-02-21 MED ORDER — METHYLPREDNISOLONE ACETATE 40 MG/ML IJ SUSP
40.0000 mg | Freq: Once | INTRAMUSCULAR | Status: AC
  Administered 2023-02-21: 40 mg via INTRAMUSCULAR

## 2023-02-21 MED ORDER — TRIAMCINOLONE ACETONIDE 0.025 % EX CREA: 1.0000 | TOPICAL_CREAM | Freq: Two times a day (BID) | CUTANEOUS | 0 refills | Status: AC

## 2023-02-21 MED ORDER — METHYLPREDNISOLONE ACETATE 80 MG/ML IJ SUSP
80.0000 mg | Freq: Once | INTRAMUSCULAR | Status: DC
Start: 2023-02-21 — End: 2023-02-21

## 2023-02-21 NOTE — Progress Notes (Signed)
Established Patient Office Visit  Subjective   Patient ID: Toni Francis, female    DOB: [birth year]  Age: 67 y.o. MRN: [id number]  Chief Complaint  Patient presents with   Rash    Rash on left arm for about a week thinks it might be poison oak   back pain    Lower back pain that radiates down right leg   Rash: Patient complains of rash involving the left arm. Rash started 1 week ago. Appearance of rash at onset: Color of lesion(s): pink. Rash has not changed over time Initial distribution: right ear.  Discomfort associated with rash: is pruritic.  Associated symptoms: none. Denies: none. Patient has not had previous evaluation of rash. Patient has not had previous treatment.  . Patient has not had contacts with similar rash. Patient has identified precipitant poison oaks. Patient has not had new exposures (soaps, lotions, laundry detergents, foods, medications, plants, insects or animals.)   Back Pain: Patient presents for presents evaluation of low back problems.  Symptoms have been present for several years and include pain in hip (crushing and sharp in character; 6/10 in severity). Initial inciting event: none. Symptoms are worst: morning. Alleviating factors identifiable by patient are none. Exacerbating factors identifiable by patient are none. Treatments so far initiated by patient: none Previous lower back problems: none. Previous workup: none. Previous treatments: none.  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   Hypothyroidism 01/12/2021   GERD (gastroesophageal reflux disease) 10/08/2020   Morbid obesity (HCC) 03/06/2020   Hypertension    History of thyroid cancer 08/13/2019   Allergic rhinitis 10/14/2015   Past Medical History:  Diagnosis Date   Abdominal hernia    Arthritis    GERD (gastroesophageal reflux disease)    Hypertension    Pinched nerve    L4-5,back   Past Surgical History:  Procedure Laterality  Date   CHOLECYSTECTOMY     THYROIDECTOMY Right 08/13/2019   Procedure: RIGHT THYROID LOBECTOMY;  Surgeon: Drema Halon, MD;  Location: [facility] OR;  Service: ENT;  Laterality: Right;   Social History   Tobacco Use   Smoking status: Never   Smokeless tobacco: Never  Vaping Use   Vaping status: Never Used  Substance Use Topics   Alcohol use: No   Drug use: No   Social History   Socioeconomic History   Marital status: Divorced    Spouse name: Not on file   Number of children: 2   Years of education: 12   Highest education level: Some college, no degree  Occupational History    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, therapeutic   Occupation: Retired  Tobacco Use   Smoking status: Never   Smokeless tobacco: Never  Vaping Use   Vaping status: Never Used  Substance and Sexual Activity   Alcohol use: No   Drug use: No   Sexual activity: Not Currently  Other Topics Concern   Not on file  Social History Narrative   Not on file   Social Determinants of Health   Financial Resource Strain: Low Risk  (04/27/2022)   Overall Financial Resource Strain (CARDIA)    Difficulty of Paying Living Expenses: Not hard at all  Food Insecurity: No Food Insecurity (04/27/2022)   Hunger Vital Sign    Worried About Running Out of Food in the Last Year: Never true    Ran Out of Food in the Last Year: Never true  Transportation  Needs: No Transportation Needs (04/27/2022)   PRAPARE - Administrator, Civil Service (Medical): No    Lack of Transportation (Non-Medical): No  Physical Activity: Inactive (04/27/2022)   Exercise Vital Sign    Days of Exercise per Week: 0 days    Minutes of Exercise per Session: 0 min  Stress: No Stress Concern Present (04/27/2022)   [employer] of Occupational Health - Occupational Stress Questionnaire    Feeling of Stress : Not at all  Social Connections: Unknown (12/24/2022)   Received from [facility]   Social Network    Social Network: Not on file  Intimate  Partner Violence: Unknown (12/24/2022)   Received from [facility]   HITS    Physically Hurt: Not on file    Insult or Talk Down To: Not on file    Threaten Physical Harm: Not on file    Scream or Curse: Not on file   Family Status  Relation Name Status   Mother  Deceased   Father  Alive   Brother  Alive   Nurse, mental health  (Not Specified)   Neg Hx  (Not Specified)  No partnership data on file   Family History  Problem Relation Age of Onset   Colon cancer Maternal Uncle 50   Rectal cancer Neg Hx    Stomach cancer Neg Hx    Allergies  Allergen Reactions   Ampicillin Rash    Did it involve swelling of the face/tongue/throat, SOB, or low BP? No Did it involve sudden or severe rash/hives, skin peeling, or any reaction on the inside of your mouth or nose? Yes Did you need to seek medical attention at a hospital or doctor's office? Yes When did it last happen?      10 + years If all above answers are "NO", may proceed with cephalosporin use.    Codeine Nausea And Vomiting and Other (See Comments)    Also causes headaches    Pseudoephedrine Palpitations      Review of Systems  Eyes:  Negative for pain.  Respiratory:  Negative for cough and shortness of breath.   Cardiovascular:  Positive for leg swelling. Negative for chest pain.  Gastrointestinal:  Negative for nausea and vomiting.  Musculoskeletal:  Positive for back pain and joint pain.  Skin:  Positive for rash.  Neurological:  Negative for dizziness and headaches.   Negative unless indicated in HPI   Objective:     BP 134/75   Pulse 79   Temp (!) 97.1 F (36.2 C) (Temporal)   Ht 5\' 7"  (1.702 m)   Wt 257 lb 12.8 oz (116.9 kg)   SpO2 96%   BMI 40.38 kg/m  BP Readings from Last 3 Encounters:  02/21/23 134/75  01/24/23 139/73  01/06/23 118/78   Wt Readings from Last 3 Encounters:  02/21/23 257 lb 12.8 oz (116.9 kg)  01/24/23 258 lb 2 oz (117.1 kg)  01/06/23 256 lb (116.1 kg)      Physical Exam Vitals and  nursing note reviewed.  Constitutional:      Appearance: She is obese.  HENT:     Head: Normocephalic and atraumatic.  Eyes:     General: No scleral icterus.    Extraocular Movements: Extraocular movements intact.     Conjunctiva/sclera: Conjunctivae normal.     Pupils: Pupils are equal, round, and reactive to light.  Cardiovascular:     Rate and Rhythm: Normal rate and regular rhythm.  Pulmonary:  Effort: Pulmonary effort is normal.     Breath sounds: Normal breath sounds.  Abdominal:     General: Bowel sounds are normal.     Palpations: Abdomen is soft.  Musculoskeletal:     Right hip: Tenderness present.     Left hip: Tenderness present.  Skin:    General: Skin is dry.     Findings: No rash.  Neurological:     Mental Status: She is alert and oriented to person, place, and time. Mental status is at baseline.  Psychiatric:        Mood and Affect: Mood normal.        Behavior: Behavior normal.        Thought Content: Thought content normal.        Judgment: Judgment normal.      No results found for any visits on 02/21/23.  Last CBC Lab Results  Component Value Date   WBC 10.1 10/25/2022   HGB 13.6 10/25/2022   HCT 42.1 10/25/2022   MCV 87 10/25/2022   MCH 28.1 10/25/2022   RDW 13.4 10/25/2022   PLT 280 10/25/2022   Last metabolic panel Lab Results  Component Value Date   GLUCOSE 87 10/25/2022   NA 145 (H) 10/25/2022   K 4.6 10/25/2022   CL 109 (H) 10/25/2022   CO2 24 10/25/2022   BUN 24 10/25/2022   CREATININE 0.98 10/25/2022   EGFR 64 10/25/2022   CALCIUM 9.6 10/25/2022   PHOS 3.8 01/29/2020   PROT 6.1 10/25/2022   ALBUMIN 4.1 10/25/2022   LABGLOB 2.0 10/25/2022   AGRATIO 2.2 04/22/2022   BILITOT 0.6 10/25/2022   ALKPHOS 79 10/25/2022   AST 12 10/25/2022   ALT 15 10/25/2022   ANIONGAP 13 01/29/2020   Last lipids Lab Results  Component Value Date   CHOL 171 10/25/2022   HDL 55 10/25/2022   LDLCALC 93 10/25/2022   TRIG 130 10/25/2022    CHOLHDL 3.1 10/25/2022   Last hemoglobin A1c No results found for: "HGBA1C" Last thyroid functions Lab Results  Component Value Date   TSH 4.160 10/25/2022   T4TOTAL 8.1 10/25/2022        Assessment & Plan:  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  Dispense: 30 g; Refill: 0  Lumbar radiculopathy -     Arthrocentesis -     methylPREDNISolone Acetate -     methylPREDNISolone Acetate   Joint Injection/Arthrocentesis  Date/Time: 02/21/2023 2:32 PM  Performed by: Martina Sinner, NP Authorized by: Martina Sinner, NP  Indications: pain  Body area: hip Joint: right hip Local anesthesia used: yes  Anesthesia: Local anesthesia used: yes Local Anesthetic: topical anesthetic  Sedation: Patient sedated: no  Needle size: 22 G Ultrasound guidance: no Approach: medial Aspirate: blood-tinged Methylprednisolone amount: 80 mg Lidocaine 1% amount: 1 mL Patient tolerance: patient tolerated the procedure well with no immediate complications    Rash: Apply triamcinolone BID   Encourage healthy lifestyle choices, including diet (rich in fruits, vegetables, and lean proteins, and low in salt and simple carbohydrates) and exercise (at least 30 minutes of moderate physical activity daily).     The above assessment and management plan was discussed with the patient. The patient verbalized understanding of and has agreed to the management plan. Patient is aware to call the clinic if they develop any new symptoms or if symptoms persist or worsen. Patient is  aware when to return to the clinic for a follow-up visit. Patient educated on when it is appropriate to go to the emergency department.  Return if symptoms worsen or fail to improve.    Arrie Aran Santa Lighter, DNP [facility] [address] [place], [place] [address] [phone]

## 2023-02-23 ENCOUNTER — Ambulatory Visit: Payer: Medicare HMO | Admitting: Gastroenterology

## 2023-02-23 ENCOUNTER — Encounter: Payer: Self-pay | Admitting: Gastroenterology

## 2023-02-23 VITALS — BP 155/90 | HR 59 | Temp 97.9°F | Resp 12 | Ht 67.0 in | Wt 257.0 lb

## 2023-02-23 DIAGNOSIS — D123 Benign neoplasm of transverse colon: Secondary | ICD-10-CM | POA: Diagnosis not present

## 2023-02-23 DIAGNOSIS — D120 Benign neoplasm of cecum: Secondary | ICD-10-CM | POA: Diagnosis not present

## 2023-02-23 DIAGNOSIS — I10 Essential (primary) hypertension: Secondary | ICD-10-CM | POA: Diagnosis not present

## 2023-02-23 DIAGNOSIS — Z1211 Encounter for screening for malignant neoplasm of colon: Secondary | ICD-10-CM

## 2023-02-23 MED ORDER — SODIUM CHLORIDE 0.9 % IV SOLN: 500.0000 mL | INTRAVENOUS | Status: DC

## 2023-02-23 NOTE — Progress Notes (Signed)
[name] History and Physical   Primary Care Physician:  Bennie Pierini, FNP   Reason for Procedure:   Colon cancer screening  Plan:    colonoscopy     HPI: ANTOINNETTE Francis is a 67 y.o. female  here for colonoscopy screening - last exam 2014 normal.   Patient denies any bowel symptoms at this time. Uncle had colon cancer - no other family members with CRC. Otherwise feels well without any cardiopulmonary symptoms.   I have discussed risks / benefits of anesthesia and endoscopic procedure with Jac Canavan and they wish to proceed with the exams as outlined today.    Past Medical History:  Diagnosis Date   Abdominal hernia    Arthritis    GERD (gastroesophageal reflux disease)    Hypertension    Pinched nerve    L4-5,back    Past Surgical History:  Procedure Laterality Date   CHOLECYSTECTOMY     THYROIDECTOMY Right 08/13/2019   Procedure: RIGHT THYROID LOBECTOMY;  Surgeon: Drema Halon, MD;  Location: [facility] OR;  Service: ENT;  Laterality: Right;    Prior to Admission medications   Medication Sig Start Date End Date Taking? Authorizing Provider  acetaminophen (TYLENOL) 500 MG tablet Take 1,000 mg by mouth daily as needed for moderate pain or headache.   Yes [provider]  fluticasone (FLONASE) 50 MCG/ACT nasal spray Place 2 sprays into both nostrils daily. USE 2 SPRAY(S) IN EACH NOSTRIL TWICE DAILY 10/25/22  Yes Daphine Deutscher, Mary-Margaret, FNP  levothyroxine (SYNTHROID) 75 MCG tablet Take 1 tablet (75 mcg total) by mouth daily. 10/25/22  Yes Martin, Mary-Margaret, FNP  triamcinolone (KENALOG) 0.025 % cream Apply 1 Application topically 2 (two) times daily. 02/21/23  Yes St Santa Lighter, Dois Davenport, NP  triamterene-hydrochlorothiazide (MAXZIDE-25) 37.5-25 MG tablet Take 1 tablet by mouth daily. For blood pressure and fluid 10/25/22  Yes Martin, Mary-Margaret, FNP  Ascorbic Acid (VITAMIN C) 500 MG CAPS Take by mouth.    [provider]   cyclobenzaprine (FLEXERIL) 10 MG tablet Take 1 tablet (10 mg total) by mouth 3 (three) times daily as needed for muscle spasms. 09/06/22   Bennie Pierini, FNP  levocetirizine (XYZAL) 5 MG tablet TAKE 1 TABLET BY MOUTH ONCE DAILY IN THE EVENING 01/20/23   Bennie Pierini, FNP  meloxicam [facility]) 15 MG tablet Take 1 tablet by mouth once daily 05/21/22   Bennie Pierini, FNP  Multiple Vitamins-Minerals (CENTRUM SILVER PO) Take 1 tablet by mouth daily.    [provider]  Multiple Vitamins-Minerals (EYE VITAMINS PO) Take 1 capsule by mouth daily.    [provider]  Nutritional Supplements (VITAMIN D BOOSTER PO) Take by mouth.    [provider]  Omega-3 Fatty Acids (FISH OIL PO) Take 1 capsule by mouth daily.     [provider]  omeprazole (PRILOSEC) 20 MG capsule Take 1 capsule (20 mg total) by mouth daily. 10/25/22   Daphine Deutscher Mary-Margaret, FNP  ZINC OXIDE PO Take by mouth.    [provider]    Current Outpatient Medications  Medication Sig Dispense Refill   acetaminophen (TYLENOL) 500 MG tablet Take 1,000 mg by mouth daily as needed for moderate pain or headache.     fluticasone (FLONASE) 50 MCG/ACT nasal spray Place 2 sprays into both nostrils daily. USE 2 SPRAY(S) IN EACH NOSTRIL TWICE DAILY 48 g 1   levothyroxine (SYNTHROID) 75 MCG tablet Take 1 tablet (75 mcg total) by mouth daily. 90 tablet 1   triamcinolone (  KENALOG) 0.025 % cream Apply 1 Application topically 2 (two) times daily. 30 g 0   triamterene-hydrochlorothiazide (MAXZIDE-25) 37.5-25 MG tablet Take 1 tablet by mouth daily. For blood pressure and fluid 90 tablet 1   Ascorbic Acid (VITAMIN C) 500 MG CAPS Take by mouth.     cyclobenzaprine (FLEXERIL) 10 MG tablet Take 1 tablet (10 mg total) by mouth 3 (three) times daily as needed for muscle spasms. 30 tablet 0   levocetirizine (XYZAL) 5 MG tablet TAKE 1 TABLET BY MOUTH ONCE DAILY IN THE EVENING 90 tablet 1   meloxicam  (MOBIC) 15 MG tablet Take 1 tablet by mouth once daily 90 tablet 0   Multiple Vitamins-Minerals (CENTRUM SILVER PO) Take 1 tablet by mouth daily.     Multiple Vitamins-Minerals (EYE VITAMINS PO) Take 1 capsule by mouth daily.     Nutritional Supplements (VITAMIN D BOOSTER PO) Take by mouth.     Omega-3 Fatty Acids (FISH OIL PO) Take 1 capsule by mouth daily.      omeprazole (PRILOSEC) 20 MG capsule Take 1 capsule (20 mg total) by mouth daily. 90 capsule 1   ZINC OXIDE PO Take by mouth.     Current Facility-Administered Medications  Medication Dose Route Frequency Provider Last Rate Last Admin   0.9 %  sodium chloride infusion  500 mL Intravenous Continuous Roosvelt Churchwell, Willaim Rayas, MD       promethazine (PHENERGAN) injection 25 mg  25 mg Intramuscular Q6H PRN Sonny Masters, FNP   25 mg at 09/30/21 1545    Allergies as of 02/23/2023 - Review Complete 02/23/2023  Allergen Reaction Noted   Ampicillin Rash 12/01/2010   Codeine Nausea And Vomiting and Other (See Comments) 12/01/2010   Pseudoephedrine Palpitations 09/19/2015    Family History  Problem Relation Age of Onset   Colon cancer Maternal Uncle 50   Rectal cancer Neg Hx    Stomach cancer Neg Hx     Social History   Socioeconomic History   Marital status: Divorced    Spouse name: Not on file   Number of children: 2   Years of education: 12   Highest education level: Some college, no degree  Occupational History    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, therapeutic   Occupation: Retired  Tobacco Use   Smoking status: Never   Smokeless tobacco: Never  Vaping Use   Vaping status: Never Used  Substance and Sexual Activity   Alcohol use: No   Drug use: No   Sexual activity: Not Currently  Other Topics Concern   Not on file  Social History Narrative   Not on file   Social Determinants of Health   Financial Resource Strain: Low Risk  (04/27/2022)   Overall Financial Resource Strain (CARDIA)    Difficulty of Paying Living Expenses: Not  hard at all  Food Insecurity: No Food Insecurity (04/27/2022)   Hunger Vital Sign    Worried About Running Out of Food in the Last Year: Never true    Ran Out of Food in the Last Year: Never true  Transportation Needs: No Transportation Needs (04/27/2022)   PRAPARE - Administrator, Civil Service (Medical): No    Lack of Transportation (Non-Medical): No  Physical Activity: Inactive (04/27/2022)   Exercise Vital Sign    Days of Exercise per Week: 0 days    Minutes of Exercise per Session: 0 min  Stress: No Stress Concern Present (04/27/2022)   [employer] of Occupational Health - Occupational Stress  Questionnaire    Feeling of Stress : Not at all  Social Connections: Unknown (12/24/2022)   Received from [facility]   Social Network    Social Network: Not on file  Intimate Partner Violence: Unknown (12/24/2022)   Received from [facility]   HITS    Physically Hurt: Not on file    Insult or Talk Down To: Not on file    Threaten Physical Harm: Not on file    Scream or Curse: Not on file    Review of Systems: All other review of systems negative except as mentioned in the HPI.  Physical Exam: Vital signs BP 138/70   Pulse (!) 54   Temp 97.9 F (36.6 C)   Ht 5\' 7"  (1.702 m)   Wt 257 lb (116.6 kg)   SpO2 100%   BMI 40.25 kg/m   General:   Alert,  Well-developed, pleasant and cooperative in NAD Lungs:  Clear throughout to auscultation.   Heart:  Regular rate and rhythm Abdomen:  Soft, nontender and nondistended.   Neuro/Psych:  Alert and cooperative. Normal mood and affect. A and O x 3  Harlin Rain, MD [facility]

## 2023-02-23 NOTE — Patient Instructions (Addendum)
  Handouts on polyps,diverticulosis,& hemorrhoids given to you today.   Await pathology results on polyps removed   Continue previous diet & medications   YOU HAD AN ENDOSCOPIC PROCEDURE TODAY AT THE [facility]:   Refer to the procedure report that was given to you for any specific questions about what was found during the examination.  If the procedure report does not answer your questions, please call your gastroenterologist to clarify.  If you requested that your care partner not be given the details of your procedure findings, then the procedure report has been included in a sealed envelope for you to review at your convenience later.  YOU SHOULD EXPECT: Some feelings of bloating in the abdomen. Passage of more gas than usual.  Walking can help get rid of the air that was put into your GI tract during the procedure and reduce the bloating. If you had a lower endoscopy (such as a colonoscopy or flexible sigmoidoscopy) you may notice spotting of blood in your stool or on the toilet paper. If you underwent a bowel prep for your procedure, you may not have a normal bowel movement for a few days.  Please Note:  You might notice some irritation and congestion in your nose or some drainage.  This is from the oxygen used during your procedure.  There is no need for concern and it should clear up in a day or so.  SYMPTOMS TO REPORT IMMEDIATELY:  Following lower endoscopy (colonoscopy or flexible sigmoidoscopy):  Excessive amounts of blood in the stool  Significant tenderness or worsening of abdominal pains  Swelling of the abdomen that is new, acute  Fever of 100F or higher    For urgent or emergent issues, a gastroenterologist can be reached at any hour by calling [phone]. Do not use MyChart messaging for urgent concerns.    DIET:  We do recommend a small meal at first, but then you may proceed to your regular diet.  Drink plenty of fluids but you should avoid alcoholic  beverages for 24 hours.  ACTIVITY:  You should plan to take it easy for the rest of today and you should NOT DRIVE or use heavy machinery until tomorrow (because of the sedation medicines used during the test).    FOLLOW UP: Our staff will call the number listed on your records the next business day following your procedure.  We will call around 7:15- 8:00 am to check on you and address any questions or concerns that you may have regarding the information given to you following your procedure. If we do not reach you, we will leave a message.     If any biopsies were taken you will be contacted by phone or by letter within the next 1-3 weeks.  Please call us at [phone] if you have not heard about the biopsies in 3 weeks.    SIGNATURES/CONFIDENTIALITY: You and/or your care partner have signed paperwork which will be entered into your electronic medical record.  These signatures attest to the fact that that the information above on your After Visit Summary has been reviewed and is understood.  Full responsibility of the confidentiality of this discharge information lies with you and/or your care-partner.Handouts on polyps,diverticulosis,& hemorrhoids given to you toda

## 2023-02-23 NOTE — Progress Notes (Signed)
Report given to PACU, vss 

## 2023-02-23 NOTE — Progress Notes (Signed)
Called to room to assist during endoscopic procedure.  Patient ID and intended procedure confirmed with present staff. Received instructions for my participation in the procedure from the performing physician.  

## 2023-02-23 NOTE — Progress Notes (Signed)
I have reviewed the patient's medical history in detail and updated the computerized patient record.

## 2023-02-23 NOTE — Op Note (Signed)
[facility] Patient Name: Toni Francis Procedure Date: 02/23/2023 10:48 AM MRN: [id number] Endoscopist: Viviann Spare P. Adela Lank , MD, [id number] Age: 67 Referring MD:  Date of Birth: 06/16/1955 Gender: Female Account #: [id number] Procedure:                Colonoscopy Indications:              Screening for colorectal malignant neoplasm Medicines:                Monitored Anesthesia Care Procedure:                Pre-Anesthesia Assessment:                           - Prior to the procedure, a History and Physical                            was performed, and patient medications and                            allergies were reviewed. The patient's tolerance of                            previous anesthesia was also reviewed. The risks                            and benefits of the procedure and the sedation                            options and risks were discussed with the patient.                            All questions were answered, and informed consent                            was obtained. Prior Anticoagulants: The patient has                            taken no anticoagulant or antiplatelet agents. ASA                            Grade Assessment: II - A patient with mild systemic                            disease. After reviewing the risks and benefits,                            the patient was deemed in satisfactory condition to                            undergo the procedure.                           After obtaining informed consent, the colonoscope  was passed under direct vision. Throughout the                            procedure, the patient's blood pressure, pulse, and                            oxygen saturations were monitored continuously. The                            Olympus Scope SN: [id number] was introduced through                            the anus and advanced to the the cecum, identified                            by  appendiceal orifice and ileocecal valve. The                            colonoscopy was performed without difficulty. The                            patient tolerated the procedure well. The quality                            of the bowel preparation was good. The ileocecal                            valve, appendiceal orifice, and rectum were                            photographed. Scope In: 10:56:06 AM Scope Out: 11:12:35 AM Scope Withdrawal Time: 0 hours 13 minutes 16 seconds  Total Procedure Duration: 0 hours 16 minutes 29 seconds  Findings:                 The perianal and digital rectal examinations were                            normal.                           A diminutive polyp was found in the cecum. The                            polyp was flat. The polyp was removed with a cold                            snare. Resection and retrieval were complete.                           A 3 to 4 mm polyp was found in the cecum. The polyp                            was sessile. The polyp was removed with  a cold                            snare. Resection and retrieval were complete.                           A 3 mm polyp was found in the transverse colon. The                            polyp was sessile. The polyp was removed with a                            cold snare. Resection and retrieval were complete.                           Scattered small-mouthed diverticula were found in                            the entire colon.                           Internal hemorrhoids were found during                            retroflexion. The hemorrhoids were small.                           The exam was otherwise without abnormality. Complications:            No immediate complications. Estimated blood loss:                            Minimal. Estimated Blood Loss:     Estimated blood loss was minimal. Impression:               - One diminutive polyp in the cecum, removed with a                             cold snare. Resected and retrieved.                           - One 3 to 4 mm polyp in the cecum, removed with a                            cold snare. Resected and retrieved.                           - One 3 mm polyp in the transverse colon, removed                            with a cold snare. Resected and retrieved.                           - Diverticulosis in the entire examined colon.                           -  Internal hemorrhoids.                           - The examination was otherwise normal. Recommendation:           - Patient has a contact number available for                            emergencies. The signs and symptoms of potential                            delayed complications were discussed with the                            patient. Return to normal activities tomorrow.                            Written discharge instructions were provided to the                            patient.                           - Resume previous diet.                           - Continue present medications.                           - Await pathology results. Viviann Spare P. Laveyah Oriol, MD 02/23/2023 11:17:53 AM This report has been signed electronically.

## 2023-02-24 ENCOUNTER — Telehealth: Payer: Self-pay

## 2023-02-24 NOTE — Telephone Encounter (Signed)
Left message on follow up call. 

## 2023-02-25 LAB — SURGICAL PATHOLOGY

## 2023-04-18 DIAGNOSIS — M5416 Radiculopathy, lumbar region: Secondary | ICD-10-CM | POA: Diagnosis not present

## 2023-04-29 ENCOUNTER — Ambulatory Visit: Payer: Medicare HMO | Admitting: Nurse Practitioner

## 2023-04-29 ENCOUNTER — Encounter: Payer: Self-pay | Admitting: Nurse Practitioner

## 2023-04-29 VITALS — BP 130/79 | HR 70 | Temp 97.8°F | Ht 67.0 in | Wt 261.0 lb

## 2023-04-29 DIAGNOSIS — M79641 Pain in right hand: Secondary | ICD-10-CM | POA: Diagnosis not present

## 2023-04-29 DIAGNOSIS — M79605 Pain in left leg: Secondary | ICD-10-CM

## 2023-04-29 DIAGNOSIS — M79642 Pain in left hand: Secondary | ICD-10-CM

## 2023-04-29 DIAGNOSIS — R600 Localized edema: Secondary | ICD-10-CM

## 2023-04-29 DIAGNOSIS — J0100 Acute maxillary sinusitis, unspecified: Secondary | ICD-10-CM

## 2023-04-29 DIAGNOSIS — I10 Essential (primary) hypertension: Secondary | ICD-10-CM | POA: Diagnosis not present

## 2023-04-29 IMAGING — US US THYROID
1 series · 12 of 25 positions shown · non-contrast
Comparison: 06/21/2019

CLINICAL DATA: 66-year-old female with history of right
hemithyroidectomy with history of papillary thyroid cancer.

EXAM:
THYROID ULTRASOUND
TECHNIQUE: Ultrasound examination of the thyroid gland and adjacent soft
tissues was performed.

[Series 1: us thyroid · 0.06mm/px · 12 of 48 slices shown]
[im 2/48]
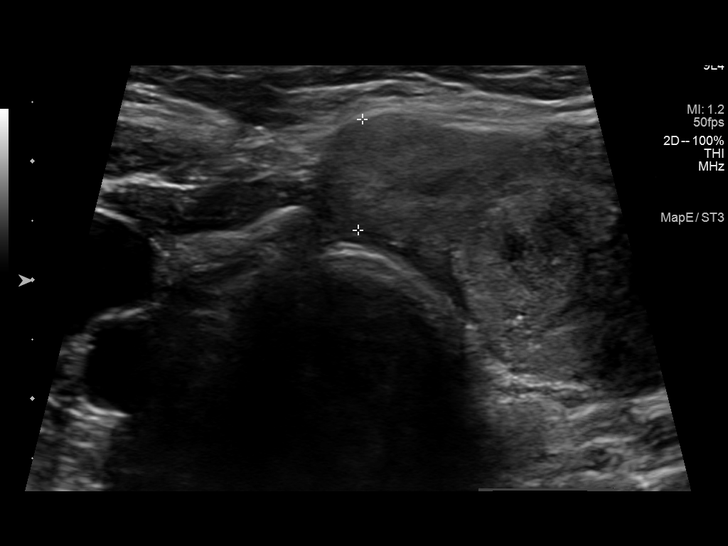
[im 6/48]
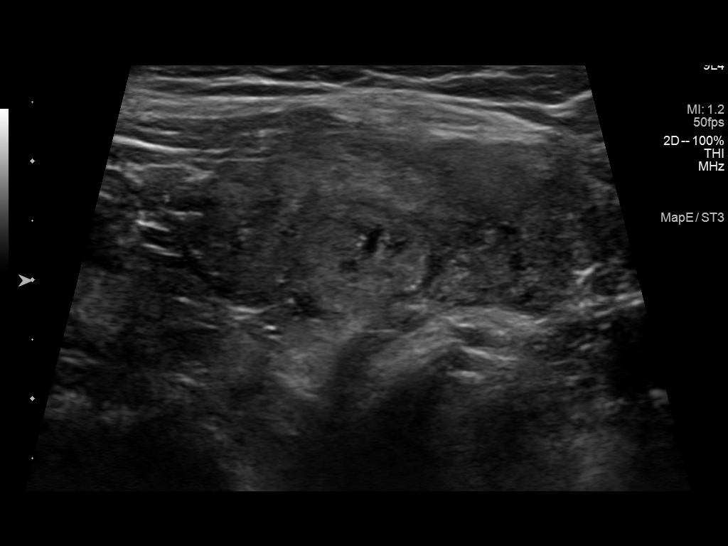
[im 10/48]
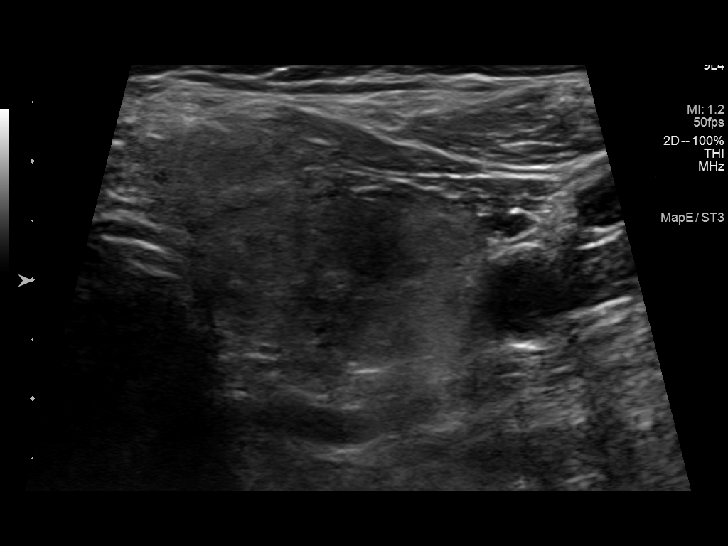
[im 14/48]
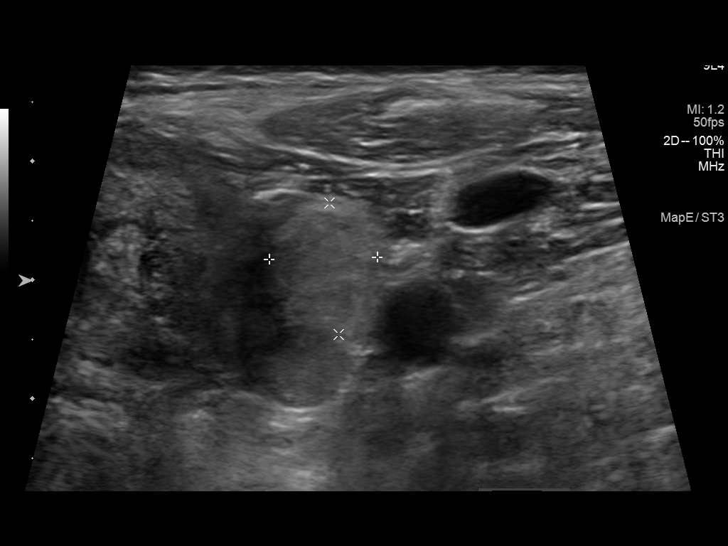
[im 18/48]
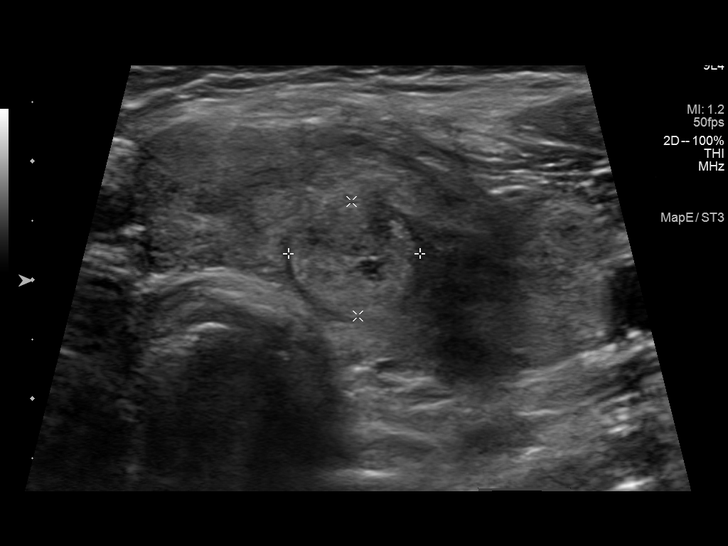
[im 22/48]
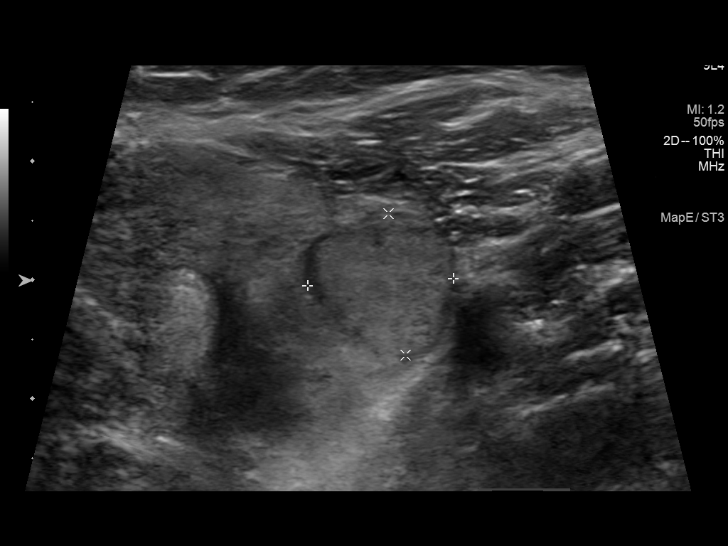
[im 26/48]
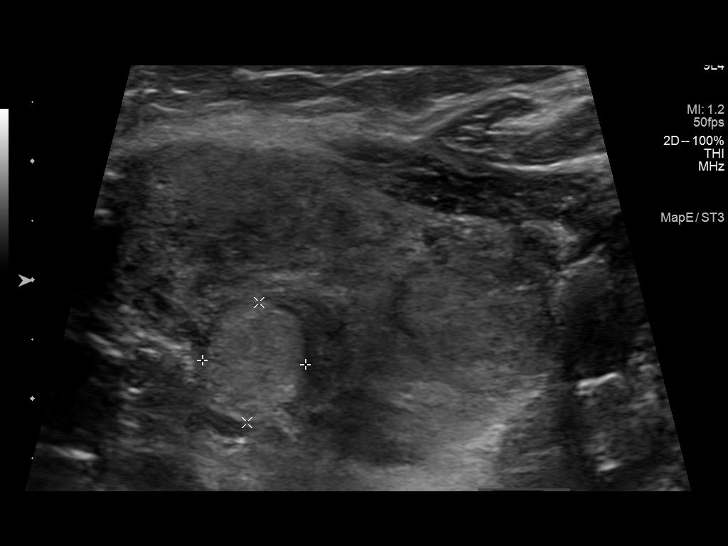
[im 30/48]
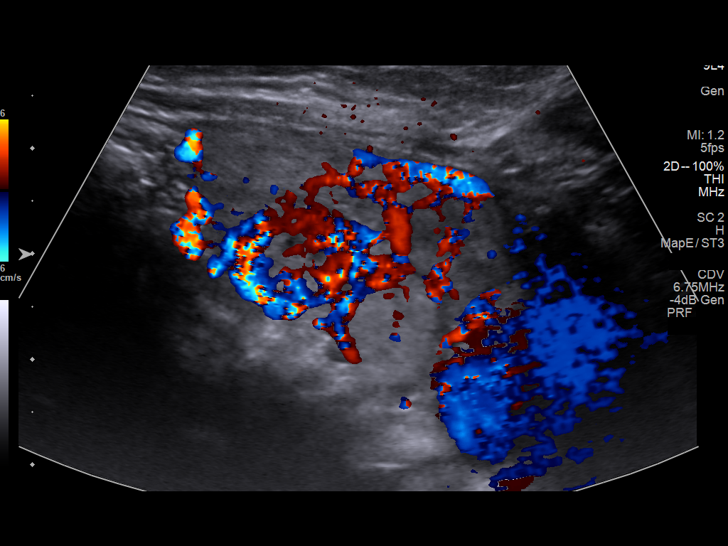
[im 34/48]
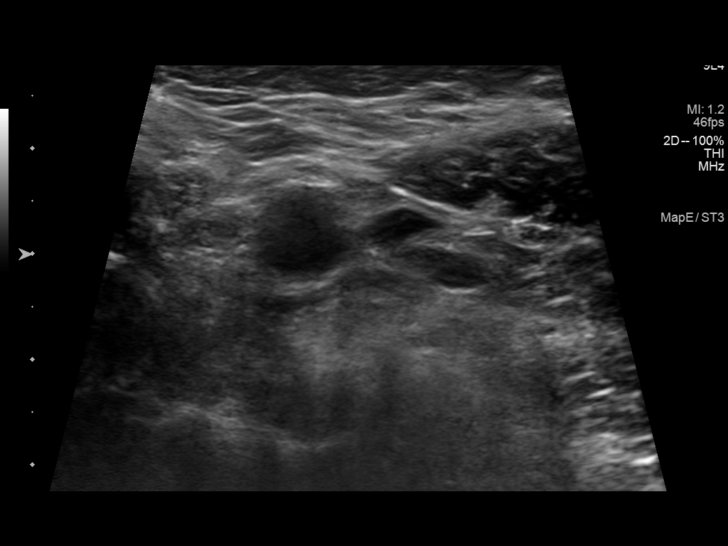
[im 38/48]
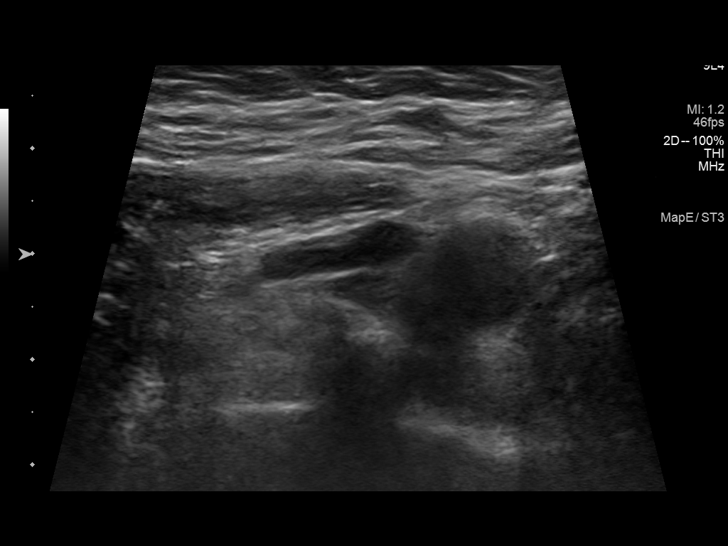
[im 42/48]
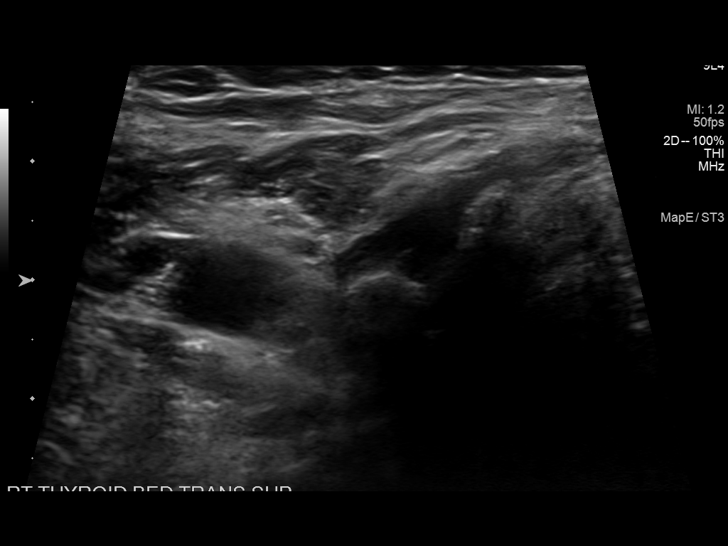
[im 46/48]
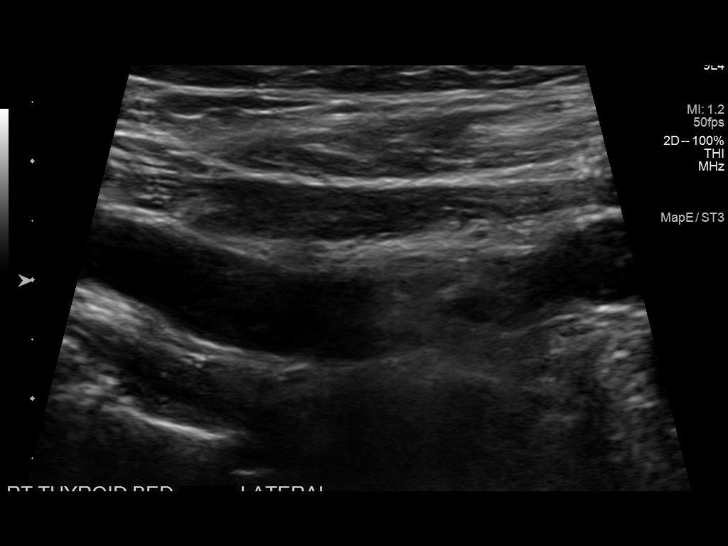

[12 of 25 positions shown; findings below may reference images not displayed]

FINDINGS: Parenchymal Echotexture: Markedly heterogeneous

Isthmus: 0.9 cm ,previously 0.6 cm

Right lobe: Postsurgical changes after right hemithyroidectomy. No
evidence of soft tissue with surgical bed.

Left lobe: 3.9 x 2.6 x 3.1 cm ,previously 4.4 x 1.6 x 2.0 cm

________________________________________________________

Estimated total number of nodules >/= 1 cm: 4

Number of spongiform nodules >/=  2 cm not described below (TR1): 0

Number of mixed cystic and solid nodules >/= 1.5 cm not described
below (TR2): 0

_________________________________________________________

Nodule # 1:

Location: Left; Superior

Maximum size: 1.3 cm; Other 2 dimensions: 1.1 x 0.9 cm

Composition: solid/almost completely solid (2)

Echogenicity: hyperechoic (1)

Shape: not taller-than-wide (0)

Margins: ill-defined (0)

Echogenic foci: none (0)

ACR TI-RADS total points: 3.

ACR TI-RADS risk category: TR3 (3 points).

ACR TI-RADS recommendations:

Given size (<1.4 cm) and appearance, this nodule does NOT meet
TI-RADS criteria for biopsy or dedicated follow-up.

_________________________________________________________

Nodule # 2:

Location: Left; Mid

Maximum size: 1.1 cm; Other 2 dimensions: 1.1 x 1.0 cm

Composition: solid/almost completely solid (2)

Echogenicity: isoechoic (1)

Shape: not taller-than-wide (0)

Margins: smooth (0)

Echogenic foci: none (0)

ACR TI-RADS total points: 3.

ACR TI-RADS risk category: TR3 (3 points).

ACR TI-RADS recommendations:

Given size (<1.4 cm) and appearance, this nodule does NOT meet
TI-RADS criteria for biopsy or dedicated follow-up.

_________________________________________________________

Nodule # 3:

Location: Left; Inferior

Maximum size: 1.4 cm; Other 2 dimensions: 1.2 x 1.2 cm

Composition: solid/almost completely solid (2)

Echogenicity: isoechoic (1)

Shape: not taller-than-wide (0)

Margins: smooth (0)

Echogenic foci: none (0)

ACR TI-RADS total points: 3.

ACR TI-RADS risk category: TR3 (3 points).

ACR TI-RADS recommendations:

Given size (<1.5 cm) and appearance, this nodule does NOT meet
TI-RADS criteria for biopsy or dedicated follow-up.

_________________________________________________________

Nodule # 4:

Location: Left; Inferior

Maximum size: 1.0 cm; Other 2 dimensions: 1.0 x 0.9 cm

Composition: solid/almost completely solid (2)

Echogenicity: hyperechoic (1)

Shape: not taller-than-wide (0)

Margins: smooth (0)

Echogenic foci: none (0)

ACR TI-RADS total points: 3.

ACR TI-RADS risk category: TR3 (3 points).

ACR TI-RADS recommendations:

Given size (<1.4 cm) and appearance, this nodule does NOT meet
TI-RADS criteria for biopsy or dedicated follow-up.

_________________________________________________________

No cervical lymphadenopathy.
IMPRESSION: 1. Postsurgical changes after right hemithyroidectomy without
evidence of local recurrence or cervical lymphadenopathy.
2. Multinodular goiters changes the remaining thyroid. The
visualized left solid thyroid nodules appear benign and do not meet
criteria for dedicated ultrasound follow-up or tissue sampling.

The above is in keeping with the ACR TI-RADS recommendations - [HOSPITAL] 3187;[DATE].

## 2023-04-29 MED ORDER — KETOROLAC TROMETHAMINE 60 MG/2ML IM SOLN
60.0000 mg | Freq: Once | INTRAMUSCULAR | Status: AC
  Administered 2023-04-29: 60 mg via INTRAMUSCULAR

## 2023-04-29 MED ORDER — CELECOXIB 200 MG PO CAPS: 200.0000 mg | ORAL_CAPSULE | Freq: Two times a day (BID) | ORAL | 2 refills | Status: DC

## 2023-04-29 MED ORDER — LISINOPRIL 20 MG PO TABS
20.0000 mg | ORAL_TABLET | Freq: Every day | ORAL | 3 refills | Status: DC
Start: 2023-04-29 — End: 2023-12-01

## 2023-04-29 MED ORDER — METHYLPREDNISOLONE ACETATE 80 MG/ML IJ SUSP
80.0000 mg | Freq: Once | INTRAMUSCULAR | Status: AC
  Administered 2023-04-29: 80 mg via INTRAMUSCULAR

## 2023-04-29 MED ORDER — DOXYCYCLINE HYCLATE 100 MG PO TABS: 100.0000 mg | ORAL_TABLET | Freq: Two times a day (BID) | ORAL | 0 refills | Status: DC

## 2023-04-29 MED ORDER — FUROSEMIDE 20 MG PO TABS: 20.0000 mg | ORAL_TABLET | Freq: Every day | ORAL | 3 refills | Status: DC

## 2023-04-29 NOTE — Addendum Note (Signed)
 Addended by: Bennie Pierini on: 04/29/2023 11:35 AM   Modules accepted: Orders, Level of Service

## 2023-04-29 NOTE — Patient Instructions (Signed)
 Peripheral Edema  Peripheral edema is swelling that is caused by a buildup of fluid. Peripheral edema most often affects the lower legs, ankles, and feet. It can also develop in the arms, hands, and face. The area of the body that has peripheral edema will look swollen. It may also feel heavy or warm. Your clothes may start to feel tight. Pressing on the area may make a temporary dent in your skin (pitting edema). You may not be able to move your swollen arm or leg as much as usual. There are many causes of peripheral edema. It can happen because of a complication of other conditions such as heart failure, kidney disease, or a problem with your circulation. It also can be a side effect of certain medicines or happen because of an infection. It often happens to women during pregnancy. Sometimes, the cause is not known. Follow these instructions at home: Managing pain, stiffness, and swelling  Raise (elevate) your legs while you are sitting or lying down. Move around often to prevent stiffness and to reduce swelling. Do not sit or stand for long periods of time. Do not wear tight clothing. Do not wear garters on your upper legs. Exercise your legs to get your circulation going. This helps to move the fluid back into your blood vessels, and it may help the swelling go down. Wear compression stockings as told by your health care provider. These stockings help to prevent blood clots and reduce swelling in your legs. It is important that these are the correct size. These stockings should be prescribed by your doctor to prevent possible injuries. If elastic bandages or wraps are recommended, use them as told by your health care provider. Medicines Take over-the-counter and prescription medicines only as told by your health care provider. Your health care provider may prescribe medicine to help your body get rid of excess water (diuretic). Take this medicine if you are told to take it. General  instructions Eat a low-salt (low-sodium) diet as told by your health care provider. Sometimes, eating less salt may reduce swelling. Pay attention to any changes in your symptoms. Moisturize your skin daily to help prevent skin from cracking and draining. Keep all follow-up visits. This is important. Contact a health care provider if: You have a fever. You have swelling in only one leg. You have increased swelling, redness, or pain in one or both of your legs. You have drainage or sores at the area where you have edema. Get help right away if: You have edema that starts suddenly or is getting worse, especially if you are pregnant or have a medical condition. You develop shortness of breath, especially when you are lying down. You have pain in your chest or abdomen. You feel weak. You feel like you will faint. These symptoms may be an emergency. Get help right away. Call 911. Do not wait to see if the symptoms will go away. Do not drive yourself to the hospital. Summary Peripheral edema is swelling that is caused by a buildup of fluid. Peripheral edema most often affects the lower legs, ankles, and feet. Move around often to prevent stiffness and to reduce swelling. Do not sit or stand for long periods of time. Pay attention to any changes in your symptoms. Contact a health care provider if you have edema that starts suddenly or is getting worse, especially if you are pregnant or have a medical condition. Get help right away if you develop shortness of breath, especially when lying down.  This information is not intended to replace advice given to you by your health care provider. Make sure you discuss any questions you have with your health care provider. Document Revised: 12/15/2020 Document Reviewed: 12/15/2020 Elsevier Patient Education  2024 [employer].

## 2023-04-29 NOTE — Progress Notes (Addendum)
 Subjective:    Patient ID: Toni Francis, female    DOB: 03-27-1956, 68 y.o.   MRN: [id number]   Chief Complaint: Sinus Problem, BP has been elevated, Hands hurting and locking up, and Leg Pain (Right leg/)   Patient comes in today with several complaints:  Sinus Problem This is a new problem. The current episode started 1 to 4 weeks ago. The problem has been waxing and waning since onset. There has been no fever. Her pain is at a severity of 8/10. The pain is moderate. Associated symptoms include congestion, coughing, headaches and sinus pressure. Past treatments include nothing. The treatment provided mild relief.  Leg Pain  There was no injury mechanism. The pain is present in the left leg. The pain is at a severity of 5/10. Pertinent negatives include no inability to bear weight, numbness or tingling. She reports no foreign bodies present. Nothing aggravates the symptoms. She has tried elevation for the symptoms. The treatment provided mild relief.   Hand pain Feel tight and ache. Has to exercise hands to make them eel better.   Peripheral edema Started several weeks ago. Is on maxide and doubled up on meds which helped fluid. Denies SOB.  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   Respiratory:  Positive for cough.   Cardiovascular:  Positive for leg swelling.  Musculoskeletal:  Positive for arthralgias.  Neurological:  Positive for headaches. Negative for tingling and numbness.       Objective:   Physical Exam Vitals and nursing note reviewed.  Constitutional:      General: She is not in acute distress.    Appearance: Normal  appearance. She is well-developed.  HENT:     Head: Normocephalic.     Right Ear: Tympanic membrane normal.     Left Ear: Tympanic membrane normal.     Nose: Congestion and rhinorrhea present.     Right Sinus: Maxillary sinus tenderness present.     Left Sinus: Maxillary sinus tenderness present.     Mouth/Throat:     Mouth: Mucous membranes are moist.  Eyes:     Pupils: Pupils are equal, round, and reactive to light.  Neck:     Vascular: No carotid bruit or JVD.  Cardiovascular:     Rate and Rhythm: Normal rate and regular rhythm.     Heart sounds: Normal heart sounds.  Pulmonary:     Effort: Pulmonary effort is normal. No respiratory distress.     Breath sounds: Normal breath sounds. No wheezing or rales.  Chest:     Chest wall: No tenderness.  Abdominal:     General: Bowel sounds are normal. There is no distension or abdominal bruit.     Palpations: Abdomen is soft. There is no hepatomegaly, splenomegaly, mass or pulsatile mass.     Tenderness: There is no abdominal tenderness.  Musculoskeletal:        General: Normal range of motion.     Cervical back: Normal range of motion and neck supple.     Right lower leg: Edema (mild) present.     Left lower leg: Edema (mild) present.  Lymphadenopathy:  Cervical: No cervical adenopathy.  Skin:    General: Skin is warm and dry.  Neurological:     Mental Status: She is alert and oriented to person, place, and time.     Deep Tendon Reflexes: Reflexes are normal and symmetric.  Psychiatric:        Behavior: Behavior normal.        Thought Content: Thought content normal.        Judgment: Judgment normal.    BP 130/79   Pulse 70   Temp 97.8 F (36.6 C) (Temporal)   Ht 5' 7 (1.702 m)   Wt 261 lb (118.4 kg)   SpO2 94%   BMI 40.88 kg/m         Assessment & Plan:   Toni Francis in today with chief complaint of Sinus Problem, BP has been elevated, Hands hurting and locking up, and Leg Pain (Right leg/)   1. Primary  hypertension (Primary) Stop maxide Start  lisinopril  daily 'keep check of blood pressure at home - lisinopril  (ZESTRIL ) 20 MG tablet; Take 1 tablet (20 mg total) by mouth daily.  Dispense: 90 tablet; Refill: 3  2. Pain of left lower extremity Moist heat rest - methylPREDNISolone  acetate (DEPO-MEDROL ) injection 80 mg - ketorolac  (TORADOL ) injection 60 mg  3. Peripheral edema Start lasix  daily Elevate legs when sitting - furosemide  (LASIX ) 20 MG tablet; Take 1 tablet (20 mg total) by mouth daily.  Dispense: 30 tablet; Refill: 3  4. Pain in both hands Stop mobic  Try celebrex  and let me know if helps - celecoxib  (CELEBREX ) 200 MG capsule; Take 1 capsule (200 mg total) by mouth 2 (two) times daily.  Dispense: 60 capsule; Refill: 2  5. sinusitis 1. Take meds as prescribed 2. Use a cool mist humidifier especially during the winter months and when heat has been humid. 3. Use saline nose sprays frequently 4. Saline irrigations of the nose can be very helpful if done frequently.  * 4X daily for 1 week*  * Use of a nettie pot can be helpful with this. Follow directions with this* 5. Drink plenty of fluids 6. Keep thermostat turn down low 7.For any cough or congestion- OTC meds 8. For fever or aces or pains- take tylenol  or ibuprofen  appropriate for age and weight.  * for fevers greater than 101 orally you may alternate ibuprofen  and tylenol  every  3 hours.   Doxycycline  100mg  BID for 10 days The above assessment and management plan was discussed with the patient. The patient verbalized understanding of and has agreed to the management plan. Patient is aware to call the clinic if symptoms persist or worsen. Patient is aware when to return to the clinic for a follow-up visit. Patient educated on when it is appropriate to go to the emergency department.   [name], FNP

## 2023-04-30 ENCOUNTER — Other Ambulatory Visit: Payer: Self-pay | Admitting: Nurse Practitioner

## 2023-04-30 DIAGNOSIS — K219 Gastro-esophageal reflux disease without esophagitis: Secondary | ICD-10-CM

## 2023-05-03 ENCOUNTER — Ambulatory Visit: Payer: Medicare HMO | Admitting: Nurse Practitioner

## 2023-05-13 ENCOUNTER — Telehealth: Payer: Self-pay | Admitting: Nurse Practitioner

## 2023-05-13 ENCOUNTER — Other Ambulatory Visit: Payer: Self-pay | Admitting: Nurse Practitioner

## 2023-05-13 MED ORDER — PREDNISONE 20 MG PO TABS: 40.0000 mg | ORAL_TABLET | Freq: Every day | ORAL | 0 refills | Status: AC

## 2023-05-13 NOTE — Telephone Encounter (Signed)
Patient called stating that the new blood pressure meds she was prescribed caused a rash. She has stopped taking and has gone back to her old meds. Rash is still there and is very itchy.  Meds ordered this encounter  Medications   predniSONE (DELTASONE) 20 MG tablet    Sig: Take 2 tablets (40 mg total) by mouth daily with breakfast for 5 days. 2 po daily for 5 days    Dispense:  10 tablet    Refill:  0    Supervising Provider:   Arville Care A [[id number]]   Mary-Margaret Daphine Deutscher, FNP

## 2023-05-17 ENCOUNTER — Ambulatory Visit: Payer: Self-pay | Admitting: Nurse Practitioner

## 2023-05-17 NOTE — Telephone Encounter (Signed)
FYI

## 2023-05-17 NOTE — Telephone Encounter (Signed)
Spoke with patient- blood pressure was hig. She added back amlodipine and was to low. Going t o take 1/2 tablet of amlodipine and see how she does. She will keep [place] notified.

## 2023-05-17 NOTE — Telephone Encounter (Signed)
Information obtained from brother Clide Cliff, No DPR.   Chief Complaint: "my sister is not sleeping and has high BP" Symptoms: not sleeping Frequency: per Clide Cliff this has been "going 9 or 10 years".  Pertinent Negatives: Patient denies HI/SI per Clide Cliff  Disposition: [] ED /[] Urgent Care (no appt availability in office) / [] Appointment(In office/virtual)/ []  [facility]/ [] Home Care/ [x] Refused Recommended Disposition /[] [facility]/ []  Follow-up with PCP  Additional Notes: Per Clide Cliff he is the pts brother.  Clide Cliff states that the pt has a high BP, but doesn't know that reading at this time. Per Clide Cliff she has been "complaining that it is high lately, 160s".  Clide Cliff also states that they all have bipolar disorders and he knows the signs. Clide Cliff states pt is having trouble sleeping. Clide Cliff does not know if the pt has been taking her medicine. Per Clide Cliff pt does not have SI/HI.  This has been ongoing for 9-10 years per [place] and "daddy told me to call".  Per Clide Cliff nothing is worse than it has been.  Ricky states: " I just want Toni Francis to know".  Clide Cliff states that Orrie needs to schedule herself for an appt, Shauntell is not present for call.   Copied from CRM [id number]. Topic: Clinical - Red Word Triage >> May 17, 2023  9:52 AM Antony Haste wrote: Red Word that prompted transfer to Nurse Triage: She is having a hard time sleeping, blood pressure is extremely high and she has some chest pain. Her brother Clide Cliff has called in to report her symptoms, he states she has been feeling sick/unwell. He is requesting to speak with one of the nurses for her. Reason for Disposition . [1] Pain is causing insomnia AND [2] pain is a chronic symptom (recurrent or ongoing AND present > 4 weeks)  Answer Assessment - Initial Assessment Questions All answers, per Clide Cliff, pts brother 1. DESCRIPTION: "Tell me about your sleeping problem."      Per Clide Cliff pt is a light sleeper and doesn't sleep a lot.  2. ONSET:  "How long have you been having trouble sleeping?" (e.g., days, weeks, months)     9-10 years 3. RECURRENT: "Have you had sleeping problems before?"  If Yes, ask: "What happened that time?" "What helped your sleeping problem go away in the past?"      Yes hx of sleep problems 5. PAIN: "Do you have any pain that is keeping you awake?" (e.g., back pain, headache, abdomen pain)     Back pain  Protocols used: Insomnia-A-AH

## 2023-06-16 ENCOUNTER — Telehealth (INDEPENDENT_AMBULATORY_CARE_PROVIDER_SITE_OTHER): Payer: Medicare HMO | Admitting: Nurse Practitioner

## 2023-06-16 ENCOUNTER — Encounter: Payer: Self-pay | Admitting: Nurse Practitioner

## 2023-06-16 DIAGNOSIS — R052 Subacute cough: Secondary | ICD-10-CM | POA: Diagnosis not present

## 2023-06-16 MED ORDER — LEVOFLOXACIN 500 MG PO TABS: 500.0000 mg | ORAL_TABLET | Freq: Every day | ORAL | 0 refills | Status: AC

## 2023-06-16 NOTE — Progress Notes (Signed)
Virtual Visit Consent   Toni Francis, you are scheduled for a virtual visit with Mary-Margaret Daphine Deutscher, FNP, a [facility] provider, today.     Just as with appointments in the office, your consent must be obtained to participate.  Your consent will be active for this visit and any virtual visit you may have with one of our providers in the next 365 days.     If you have a MyChart account, a copy of this consent can be sent to you electronically.  All virtual visits are billed to your insurance company just like a traditional visit in the office.    As this is a virtual visit, video technology does not allow for your provider to perform a traditional examination.  This may limit your provider's ability to fully assess your condition.  If your provider identifies any concerns that need to be evaluated in person or the need to arrange testing (such as labs, EKG, etc.), we will make arrangements to do so.     Although advances in technology are sophisticated, we cannot ensure that it will always work on either your end or our end.  If the connection with a video visit is poor, the visit may have to be switched to a telephone visit.  With either a video or telephone visit, we are not always able to ensure that we have a secure connection.     I need to obtain your verbal consent now.   Are you willing to proceed with your visit today? YES   SHATERRICA TERRITO has provided verbal consent on 06/16/2023 for a virtual visit (video or telephone).   Mary-Margaret Daphine Deutscher, FNP   Date: 06/16/2023 9:20 AM   Virtual Visit via Video Note   I, Mary-Margaret Daphine Deutscher, connected with AKI BURDIN ([id number], [birth year]) on 06/16/23 at 11:15 AM EST by a video-enabled telemedicine application and verified that I am speaking with the correct person using two identifiers.  Location: Patient: Virtual Visit Location Patient: Home  Provider: Virtual Visit Location Provider: Mobile   I discussed the limitations of  evaluation and management by telemedicine and the availability of in person appointments. The patient expressed understanding and agreed to proceed.    History of Present Illness: Toni Francis is a 68 y.o. who identifies as a female who was assigned female at birth, and is being seen today for cough.  HPI: Has been sick since Monday- had some left  over doxycycline and took 4 of them . No better  Cough This is a new problem. The current episode started in the past 7 days. The problem has been waxing and waning. The problem occurs constantly. The cough is Productive of sputum. Associated symptoms include rhinorrhea and a sore throat. Pertinent negatives include no chills, ear congestion, ear pain, fever or shortness of breath. Nothing aggravates the symptoms. She has tried OTC cough suppressant for the symptoms. The treatment provided mild relief. Her past medical history is significant for bronchitis.    Review of Systems  Constitutional:  Negative for chills and fever.  HENT:  Positive for rhinorrhea and sore throat. Negative for ear pain.   Respiratory:  Positive for cough. Negative for shortness of breath.     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 No Did it involve sudden or severe rash/hives, skin peeling, or any reaction on the inside of your mouth or nose? Yes Did you need to seek medical attention at a hospital or doctor's office? Yes When did it last happen?      10 + years If all above answers are "NO", may proceed with cephalosporin use.    Codeine Nausea And Vomiting  and Other (See Comments)    Also causes headaches    Pseudoephedrine Palpitations   Medications:  Current Outpatient Medications:    acetaminophen (TYLENOL) 500 MG tablet, Take 1,000 mg by mouth daily as needed for moderate pain or headache., Disp: , Rfl:    Ascorbic Acid (VITAMIN C) 500 MG CAPS, Take by mouth., Disp: , Rfl:    celecoxib (CELEBREX) 200 MG capsule, Take 1 capsule (200 mg total) by mouth 2 (two) times daily., Disp: 60 capsule, Rfl: 2   cyclobenzaprine (FLEXERIL) 10 MG tablet, Take 1 tablet (10 mg total) by mouth 3 (three) times daily as needed for muscle spasms., Disp: 30 tablet, Rfl: 0   doxycycline (VIBRA-TABS) 100 MG tablet, Take 1 tablet (100 mg total) by mouth 2 (two) times daily. 1 po bid, Disp: 20 tablet, Rfl: 0   fluticasone (FLONASE) 50 MCG/ACT nasal spray, Place 2 sprays into both nostrils daily. USE 2 SPRAY(S) IN EACH NOSTRIL TWICE DAILY, Disp: 48 g, Rfl: 1   furosemide (LASIX) 20 MG tablet, Take 1 tablet (20 mg total) by mouth daily., Disp: 30 tablet, Rfl: 3   levocetirizine (XYZAL) 5 MG tablet, TAKE 1 TABLET BY MOUTH ONCE DAILY IN THE EVENING, Disp: 90 tablet, Rfl: 1   levothyroxine (SYNTHROID) 75 MCG tablet, Take 1 tablet (75 mcg total) by mouth daily., Disp: 90 tablet, Rfl: 1   lisinopril (ZESTRIL) 20 MG tablet, Take 1 tablet (20 mg total) by mouth daily., Disp: 90 tablet, Rfl: 3   Multiple Vitamins-Minerals (CENTRUM SILVER PO), Take 1 tablet by mouth daily., Disp: , Rfl:    Multiple Vitamins-Minerals (EYE VITAMINS PO), Take 1 capsule by mouth daily., Disp: , Rfl:    Nutritional Supplements (VITAMIN D BOOSTER PO), Take by mouth., Disp: , Rfl:    Omega-3 Fatty Acids (FISH OIL PO), Take 1 capsule by mouth daily. , Disp: , Rfl:    omeprazole (PRILOSEC) 20 MG capsule, Take 1 capsule by mouth once daily, Disp: 90 capsule, Rfl: 0   triamcinolone (KENALOG) 0.025 % cream, Apply 1 Application topically 2 (two) times daily., Disp: 30 g, Rfl: 0   ZINC OXIDE PO, Take by  mouth., Disp: , Rfl:   Current Facility-Administered Medications:    promethazine (PHENERGAN) injection 25 mg, 25 mg, Intramuscular, Q6H PRN, Sonny Masters, FNP, 25 mg at 09/30/21 1545  Observations/Objective: Patient is well-developed, well-nourished in no acute distress.  Resting comfortably  at home.  Head is normocephalic, atraumatic.  No labored breathing.  Speech is clear and coherent with logical content.  Patient is alert and oriented at baseline.  Raspy voice Deep cough noted  Assessment and Plan:  Jac Canavan in today with chief complaint of No chief complaint on file.   1. Subacute cough (Primary) 1. Take meds as prescribed 2. Use a cool mist humidifier especially during the winter months and when heat has  been humid. 3. Use saline nose sprays frequently 4. Saline irrigations of the nose can be very helpful if done frequently.  * 4X daily for 1 week*  * Use of a nettie pot can be helpful with this. Follow directions with this* 5. Drink plenty of fluids 6. Keep thermostat turn down low 7.For any cough or congestion- delsym or mucinex 8. For fever or aces or pains- take tylenol or ibuprofen appropriate for age and weight.  * for fevers greater than 101 orally you may alternate ibuprofen and tylenol every  3 hours.    - levofloxacin (LEVAQUIN) 500 MG tablet; Take 1 tablet (500 mg total) by mouth daily for 7 days.  Dispense: 7 tablet; Refill: 0   Follow Up Instructions: I discussed the assessment and treatment plan with the patient. The patient was provided an opportunity to ask questions and all were answered. The patient agreed with the plan and demonstrated an understanding of the instructions.  A copy of instructions were sent to the patient via MyChart.  The patient was advised to call back or seek an in-person evaluation if the symptoms worsen or if the condition fails to improve as anticipated.  Time:  I spent 5 minutes with the patient via telehealth  technology discussing the above problems/concerns.    Mary-Margaret Daphine Deutscher, FNP

## 2023-06-16 NOTE — Patient Instructions (Signed)

## 2023-07-08 ENCOUNTER — Other Ambulatory Visit: Payer: Self-pay | Admitting: Nurse Practitioner

## 2023-07-08 DIAGNOSIS — I10 Essential (primary) hypertension: Secondary | ICD-10-CM

## 2023-07-12 ENCOUNTER — Ambulatory Visit (INDEPENDENT_AMBULATORY_CARE_PROVIDER_SITE_OTHER): Admitting: Nurse Practitioner

## 2023-07-12 ENCOUNTER — Encounter: Payer: Self-pay | Admitting: Nurse Practitioner

## 2023-07-12 VITALS — BP 139/87 | HR 93 | Temp 96.9°F | Ht 67.0 in | Wt 259.2 lb

## 2023-07-12 DIAGNOSIS — M5432 Sciatica, left side: Secondary | ICD-10-CM | POA: Diagnosis not present

## 2023-07-12 MED ORDER — PREDNISONE 20 MG PO TABS: 40.0000 mg | ORAL_TABLET | Freq: Every day | ORAL | 0 refills | Status: AC

## 2023-07-12 MED ORDER — KETOROLAC TROMETHAMINE 60 MG/2ML IM SOLN
60.0000 mg | Freq: Once | INTRAMUSCULAR | Status: AC
  Administered 2023-07-12: 60 mg via INTRAMUSCULAR

## 2023-07-12 MED ORDER — METHYLPREDNISOLONE ACETATE 80 MG/ML IJ SUSP
80.0000 mg | Freq: Once | INTRAMUSCULAR | Status: AC
  Administered 2023-07-12: 80 mg via INTRAMUSCULAR

## 2023-07-12 NOTE — Patient Instructions (Signed)
 Sciatica  Sciatica is pain, weakness, tingling, or loss of feeling (numbness) along the sciatic nerve. The sciatic nerve starts in the lower back and goes down the back of each leg. Sciatica usually affects one side of the body. Sciatica usually goes away on its own or with treatment. Sometimes, sciatica may come back. What are the causes? This condition happens when the sciatic nerve is pinched or has pressure put on it. This may be caused by: A disk in between the bones of the spine bulging out too far (herniated disk). Changes in the spinal disks due to aging. A condition that affects a muscle in the butt. Extra bone growth near the sciatic nerve. A break (fracture) of the area between your hip bones (pelvis). Pregnancy. Tumor. This is rare. What increases the risk? You are more likely to develop this condition if you: Play sports that put pressure or stress on the spine. Have poor strength and ease of movement (flexibility). Have had a back injury or back surgery. Sit for long periods of time. Do activities that involve bending or lifting over and over again. Are very overweight (obese). What are the signs or symptoms? Symptoms can vary from mild to very bad. They may include: Any of these problems in the lower back, leg, hip, or butt: Mild tingling, loss of feeling, or dull aches. A burning feeling. Sharp pains. Loss of feeling in the back of the calf or the sole of the foot. Leg weakness. Very bad back pain that makes it hard to move. These symptoms may get worse when you cough, sneeze, or laugh. They may also get worse when you sit or stand for long periods of time. How is this treated? This condition often gets better without any treatment. However, treatment may include: Changing or cutting back on physical activity when you have pain. Exercising, including strengthening and stretching. Putting ice or heat on the affected area. Shots of medicines to relieve pain and  swelling or to relax your muscles. Surgery. Follow these instructions at home: Medicines Take over-the-counter and prescription medicines only as told by your doctor. Ask your doctor if you should avoid driving or using machines while you are taking your medicine. Managing pain     If told, put ice on the affected area. To do this: Put ice in a plastic bag. Place a towel between your skin and the bag. Leave the ice on for 20 minutes, 2-3 times a day. If your skin turns bright red, take off the ice right away to prevent skin damage. The risk of skin damage is higher if you cannot feel pain, heat, or cold. If told, put heat on the affected area. Do this as often as told by your doctor. Use the heat source that your doctor tells you to use, such as a moist heat pack or a heating pad. Place a towel between your skin and the heat source. Leave the heat on for 20-30 minutes. If your skin turns bright red, take off the heat right away to prevent burns. The risk of burns is higher if you cannot feel pain, heat, or cold. Activity  Return to your normal activities when your doctor says that it is safe. Avoid activities that make your symptoms worse. Take short rests during the day. When you rest for a long time, do some physical activity or stretching between periods of rest. Avoid sitting for a long time without moving. Get up and move around at least one time each  hour. Do exercises and stretches as told by your doctor. Do not lift anything that is heavier than 10 lb (4.5 kg). Avoid lifting heavy things even when you do not have symptoms. Avoid lifting heavy things over and over. When you lift objects, always lift in a way that is safe for your body. To do this, you should: Bend your knees. Keep the object close to your body. Avoid twisting. General instructions Stay at a healthy weight. Wear comfortable shoes that support your feet. Avoid wearing high heels. Avoid sleeping on a mattress  that is too soft or too hard. You might have less pain if you sleep on a mattress that is firm enough to support your back. Contact a doctor if: Your pain is not controlled by medicine. Your pain does not get better. Your pain gets worse. Your pain lasts longer than 4 weeks. You lose weight without trying. Get help right away if: You cannot control when you pee (urinate) or poop (have a bowel movement). You have weakness in any of these areas and it gets worse: Lower back. The area between your hip bones. Butt. Legs. You have redness or swelling of your back. You have a burning feeling when you pee. Summary Sciatica is pain, weakness, tingling, or loss of feeling (numbness) along the sciatic nerve. This may include the lower back, legs, hips, and butt. This condition happens when the sciatic nerve is pinched or has pressure put on it. Treatment often includes rest, exercise, medicines, and putting ice or heat on the affected area. This information is not intended to replace advice given to you by your health care provider. Make sure you discuss any questions you have with your health care provider. Document Revised: 07/20/2021 Document Reviewed: 07/20/2021 Elsevier Patient Education  2024 [employer].

## 2023-07-12 NOTE — Progress Notes (Signed)
   Subjective:    Patient ID: Toni Francis, female    DOB: Apr 28, 1955, 68 y.o.   MRN: [id number]   Chief Complaint: leg pain  Leg Pain  There was no injury mechanism. The pain is present in the left leg. The quality of the pain is described as burning, cramping and shooting. The pain is at a severity of 10/10. The pain is severe. Associated symptoms include a loss of motion. Associated symptoms comments: Pain with walking. The symptoms are aggravated by movement. She has tried acetaminophen for the symptoms. The treatment provided mild relief.    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  Eyes:  Negative for pain.  Respiratory:  Negative for shortness of breath.   Cardiovascular:  Negative for chest pain, palpitations and leg swelling.  Gastrointestinal:  Negative for abdominal pain.  Endocrine: Negative for polydipsia.  Skin:  Negative for rash.  Neurological:  Negative for dizziness, weakness and headaches.  Hematological:  Does not bruise/bleed easily.  All other systems reviewed and are negative.      Objective:   Physical Exam Constitutional:      Appearance: Normal appearance.  Cardiovascular:     Rate and Rhythm: Normal rate and regular rhythm.     Heart sounds: Normal heart sounds.  Pulmonary:     Breath sounds: Normal breath sounds.  Musculoskeletal:     Comments: FROM of lumbar spine with no increase in pain ( -) SLR bil Motor strength and sensation distally intact  Skin:    General: Skin is warm.  Neurological:     General: No focal deficit present.     Mental Status: She is alert and oriented to person, place, and time.   Psychiatric:        Mood and Affect: Mood normal.        Behavior: Behavior normal.    BP 139/87   Pulse 93   Temp (!) 96.9 F (36.1 C)   Ht 5\' 7"  (1.702 m)   Wt 259 lb 3.2 oz (117.6 kg)   SpO2 96%   BMI 40.60 kg/m         Assessment & Plan:   Toni Francis in today with chief complaint of Leg Pain (Left leg started hurting 1 week ago. Have tried ice, heat, topical ointment, elevating, and tramadol, nothing is helping. Leg is swelling. )   1. Sciatica of left side (Primary) Moist heat Rest RTO prn - predniSONE (DELTASONE) 20 MG tablet; Take 2 tablets (40 mg total) by mouth daily with breakfast for 5 days. 2 po daily for 5 days  Dispense: 10 tablet; Refill: 0 - methylPREDNISolone acetate (DEPO-MEDROL) injection 80 mg - ketorolac (TORADOL) injection 60 mg    The above assessment and management plan was discussed with the patient. The patient verbalized understanding of and has agreed to the management plan. Patient is aware to call the clinic if symptoms persist or worsen. Patient is aware when to return to the clinic for a follow-up visit. Patient educated on when it is appropriate to go to the emergency department.   Mary-Margaret Daphine Deutscher, FNP

## 2023-07-21 ENCOUNTER — Other Ambulatory Visit: Payer: Self-pay | Admitting: Nurse Practitioner

## 2023-07-21 DIAGNOSIS — J301 Allergic rhinitis due to pollen: Secondary | ICD-10-CM

## 2023-07-21 DIAGNOSIS — Z1231 Encounter for screening mammogram for malignant neoplasm of breast: Secondary | ICD-10-CM

## 2023-07-27 ENCOUNTER — Other Ambulatory Visit: Payer: Self-pay | Admitting: Nurse Practitioner

## 2023-07-27 DIAGNOSIS — K219 Gastro-esophageal reflux disease without esophagitis: Secondary | ICD-10-CM

## 2023-08-01 ENCOUNTER — Encounter: Payer: Self-pay | Admitting: Nurse Practitioner

## 2023-08-01 ENCOUNTER — Ambulatory Visit (INDEPENDENT_AMBULATORY_CARE_PROVIDER_SITE_OTHER)

## 2023-08-01 ENCOUNTER — Ambulatory Visit (INDEPENDENT_AMBULATORY_CARE_PROVIDER_SITE_OTHER): Admitting: Nurse Practitioner

## 2023-08-01 VITALS — BP 141/81 | HR 100 | Temp 96.7°F | Ht 67.0 in | Wt 265.0 lb

## 2023-08-01 DIAGNOSIS — M79671 Pain in right foot: Secondary | ICD-10-CM

## 2023-08-01 NOTE — Patient Instructions (Signed)
 Foot Sprain  A foot sprain is an injury to one of the ligaments in the feet. Ligaments are strong tissues that connect bones to each other. The ligament can be stretched too much. In some cases, it may tear. A tear can be either partial or complete. The severity of the sprain depends on how much of the ligament was damaged or torn. What are the causes? This condition is usually caused by suddenly twisting or pivoting your foot. What increases the risk? You are more likely to develop this condition if: You play a sport, such as basketball or football. You exercise or play a sport without first warming up your muscles. You start a new workout or sport. You suddenly increase how long or hard you exercise or play a sport. You have injured your foot or ankle before. What are the signs or symptoms? Symptoms of this condition start soon after an injury and include: Pain, especially in the arch of your foot. Bruising. Swelling. Being unable to walk or use your foot to support body weight. How is this diagnosed? This condition is diagnosed with a medical history and physical exam. You may also have imaging tests, such as: X-rays to check for broken bones (fractures). An MRI to see if the ligament is torn. How is this treated? Treatment for this condition depends on the severity of the sprain. Mild sprains and major sprains can be treated with: Rest, ice, pressure (compression), and elevation (RICE). Elevation means raising your injured foot. Keeping your foot in a fixed position (immobilization) for a period of time. This is done if your ligament is overstretched or partially torn. Your health care provider will apply a bandage, splint, or walking boot to keep your foot from moving until it heals. Using crutches or a scooter for a few weeks to avoid bearing weight on your foot while it is healing. Physical therapy exercises to improve movement and strength in your foot. Major sprains may also be  treated with: Surgery. This is done if your ligament is fully torn and a procedure is needed to reconnect it to the bone. A cast or splint. This will be needed after surgery. A cast or splint will need to stay on your foot while it heals. Follow these instructions at home: If you have a bandage, splint, or boot: Wear it as told by your health care provider. Remove it only as told by your health care provider. Loosen it if your toes tingle, become numb, or turn cold and blue. Keep it clean and dry. If you have a cast: Do not put pressure on any part of the cast until it is fully hardened. This may take several hours. Do not stick anything inside the cast to scratch your skin. Doing that increases your risk for infection. Check the skin around the cast every day. Tell your health care provider about any concerns. You may put lotion on dry skin around the edges of the cast. Do not put lotion on the skin underneath the cast. Keep it clean and dry. Bathing Do not take baths, swim, or use a hot tub until your health care provider approves. Ask your health care provider if you may take showers. You may only be allowed to take sponge baths. If the bandage, splint, boot, or cast is not waterproof: Do not let it get wet. Cover it with a watertight covering when you take a bath or shower. Managing pain, stiffness, and swelling  If directed, put ice on the  injured area. To do this: If you have a removable bandage, splint, or boot, remove it as told by your health care provider. Put ice in a plastic bag. Place a towel between your skin and the bag, or between your cast and the bag. Leave the ice on for 20 minutes, 2-3 times per day. Remove the ice if your skin turns bright red. This is very important. If you cannot feel pain, heat, or cold, you have a greater risk of damage to the area. Move your toes often to reduce stiffness and swelling. Elevate the injured area above the level of your heart while  you are sitting or lying down. Activity Do not use the injured foot to support your body weight until your health care provider says that you can. Use crutches or a scooter as told by your health care provider. Ask your health care provider what activities are safe for you. Do exercises as told by your health care provider. Gradually increase how much and how far you walk until your health care provider says it is safe to return to full activity. Driving Ask your health care provider if the medicine prescribed to you requires you to avoid driving or using machinery. Ask your health care provider when it is safe to drive if you have a bandage, splint, boot, or cast on your foot. General instructions Take over-the-counter and prescription medicines only as told by your health care provider. When you can walk without pain, wear supportive shoes that have stiff soles. Do not wear flip-flops. Do not walk barefoot. Keep all follow-up visits. This is important. Contact a health care provider if: Medicine does not help your pain. Your bruising or swelling gets worse or does not get better with treatment. Your splint, boot, or cast is damaged. Get help right away if: You develop severe numbness or tingling in your foot. Your foot turns blue, white, or gray, and it feels cold. Summary A foot sprain is an injury to one of the ligaments in the feet. Ligaments are strong tissues that connect bones to each other. You may need a bandage, splint, boot, or cast to support your foot while it heals. Sometimes, surgery may be needed. You may need physical therapy exercises to improve movement and strength in your foot. This information is not intended to replace advice given to you by your health care provider. Make sure you discuss any questions you have with your health care provider. Document Revised: 01/10/2023 Document Reviewed: 01/10/2023 Elsevier Patient Education  2024 [employer].

## 2023-08-01 NOTE — Progress Notes (Signed)
   Subjective:    Patient ID: Toni Francis, female    DOB: 05-Feb-1956, 68 y.o.   MRN: [id number]   Chief Complaint: foot pain  Foot Pain Associated symptoms include arthralgias (right aknle and foot). Pertinent negatives include no abdominal pain, chest pain, diaphoresis, headaches, rash or weakness.    Patient went on a bus trip with her church this weekend. When she was stepping on bus her right foot inverted and now she has ankle pain. She bought elastic brace which has helped her be able to bear weight in her foot.  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  Eyes:  Negative for pain.  Respiratory:  Negative for shortness of breath.   Cardiovascular:  Negative for chest pain, palpitations and leg swelling.  Gastrointestinal:  Negative for abdominal pain.  Endocrine: Negative for polydipsia.  Musculoskeletal:  Positive for arthralgias (right aknle and foot).  Skin:  Negative for rash.  Neurological:  Negative for dizziness, weakness and headaches.  Hematological:  Does not bruise/bleed easily.  All other systems reviewed and are negative.      Objective:   Physical Exam Constitutional:      Appearance: Normal appearance. She is obese.  Cardiovascular:     Rate and Rhythm: Normal rate and regular rhythm.  Pulmonary:     Breath sounds: Normal breath sounds.  Musculoskeletal:     Comments: Mild right ankle edema Pain on palpation medially and laterally FROM with pain on inversion and eversion  Skin:    General: Skin is warm.  Neurological:     General: No focal deficit present.     Mental Status: She is alert and oriented to person,  place, and time.  Psychiatric:        Mood and Affect: Mood normal.        Behavior: Behavior normal.    BP (!) 141/81   Pulse 100   Temp (!) 96.7 F (35.9 C) (Temporal)   Ht 5\' 7"  (1.702 m)   Wt 265 lb (120.2 kg)   SpO2 95%   BMI 41.50 kg/m   Right foot xray- no fracture-Preliminary reading by Paulene Floor, FNP  [facility]        Assessment & Plan:   Toni Francis in today with chief complaint of Foot Pain   1. Right foot pain (Primary) Tylenol Ice  Elevate Rest RTO prn - DG Foot Complete Right    The above assessment and management plan was discussed with the patient. The patient verbalized understanding of and has agreed to the management plan. Patient is aware to call the clinic if symptoms persist or worsen. Patient is aware when to return to the clinic for a follow-up visit. Patient educated on when it is appropriate to go to the emergency department.   Mary-Margaret Daphine Deutscher, FNP

## 2023-08-04 ENCOUNTER — Other Ambulatory Visit: Payer: Self-pay | Admitting: Nurse Practitioner

## 2023-08-04 MED ORDER — AZITHROMYCIN 250 MG PO TABS
ORAL_TABLET | ORAL | 0 refills | Status: DC
Start: 2023-08-04 — End: 2023-09-12

## 2023-08-05 ENCOUNTER — Ambulatory Visit: Payer: Medicare HMO

## 2023-08-05 ENCOUNTER — Ambulatory Visit (INDEPENDENT_AMBULATORY_CARE_PROVIDER_SITE_OTHER)

## 2023-08-05 VITALS — Ht 67.0 in | Wt 260.0 lb

## 2023-08-05 DIAGNOSIS — Z0000 Encounter for general adult medical examination without abnormal findings: Secondary | ICD-10-CM | POA: Diagnosis not present

## 2023-08-05 NOTE — Patient Instructions (Signed)
 Toni Francis , Thank you for taking time to come for your Medicare Wellness Visit. I appreciate your ongoing commitment to your health goals. Please review the following plan we discussed and let me know if I can assist you in the future.   Referrals/Orders/Follow-Ups/Clinician Recommendations:  Next Medicare AWV: August 10, 2024 at 9:20 am video visit.   This is a list of the screening recommended for you and due dates:  Health Maintenance  Topic Date Due   Zoster (Shingles) Vaccine (1 of 2) 08/23/1975   COVID-19 Vaccine (3 - Pfizer risk series) 08/11/2019   Pneumonia Vaccine (1 of 1 - PCV) 10/25/2023*   Hepatitis C Screening  10/25/2023*   Mammogram  08/12/2023   Flu Shot  11/25/2023   Medicare Annual Wellness Visit  08/04/2024   DEXA scan (bone density measurement)  10/26/2024   Colon Cancer Screening  02/23/2028   DTaP/Tdap/Td vaccine (3 - Td or Tdap) 01/05/2033   HPV Vaccine  Aged Out   Meningitis B Vaccine  Aged Out  *Topic was postponed. The date shown is not the original due date.    Advanced directives: (ACP Link)Information on Advanced Care Planning can be found at [facility] of [place] Advance Health Care Directives Advance Health Care Directives. [email or web address]  Advance Care Planning is important because it:  [x]  Makes sure you receive the medical care that is consistent with your values, goals, and preferences  [x]  It provides guidance to your family and loved ones and it also reduces their decisional burden about whether or not they are making the right decisions based on what you want done  Follow the link provided in your after visit summary or read over the paperwork we have mailed to you to help you started getting your Advance Directives in place. If you need assistance in completing these, please reach out to [place] so that we can help you!   Next Medicare Annual Wellness Visit scheduled for next year: yes  Understanding Your Risk for Falls Millions of people  have serious injuries from falls each year. It is important to understand your risk of falling. Talk with your health care provider about your risk and what you can do to lower it. If you do have a serious fall, make sure to tell your provider. Falling once raises your risk of falling again. How can falls affect me? Serious injuries from falls are common. These include: Broken bones, such as hip fractures. Head injuries, such as traumatic brain injuries (TBI) or concussions. A fear of falling can cause you to avoid activities and stay at home. This can make your muscles weaker and raise your risk for a fall. What can increase my risk? There are a number of risk factors that increase your risk for falling. The more risk factors you have, the higher your risk of falling. Serious injuries from a fall happen most often to people who are older than 67 years old. Teenagers and young adults ages 52-29 are also at higher risk. Common risk factors include: Weakness in the lower body. Being generally weak or confused due to long-term (chronic) illness. Dizziness or balance problems. Poor vision. Medicines that cause dizziness or drowsiness. These may include: Medicines for your blood pressure, heart, anxiety, insomnia, or swelling (edema). Pain medicines. Muscle relaxants. Other risk factors include: Drinking alcohol. Having had a fall in the past. Having foot pain or wearing improper footwear. Working at a dangerous job. Having any of the following in your home: Tripping  hazards, such as floor clutter or loose rugs. Poor lighting. Pets. Having dementia or memory loss. What actions can I take to lower my risk of falling?     Physical activity Stay physically fit. Do strength and balance exercises. Consider taking a regular class to build strength and balance. Yoga and tai chi are good options. Vision Have your eyes checked every year and your prescription for glasses or contacts updated as  needed. Shoes and walking aids Wear non-skid shoes. Wear shoes that have rubber soles and low heels. Do not wear high heels. Do not walk around the house in socks or slippers. Use a cane or walker as told by your provider. Home safety Attach secure railings on both sides of your stairs. Install grab bars for your bathtub, shower, and toilet. Use a non-skid mat in your bathtub or shower. Attach bath mats securely with double-sided, non-slip rug tape. Use good lighting in all rooms. Keep a flashlight near your bed. Make sure there is a clear path from your bed to the bathroom. Use night-lights. Do not use throw rugs. Make sure all carpeting is taped or tacked down securely. Remove all clutter from walkways and stairways, including extension cords. Repair uneven or broken steps and floors. Avoid walking on icy or slippery surfaces. Walk on the grass instead of on icy or slick sidewalks. Use ice melter to get rid of ice on walkways in the winter. Use a cordless phone. Questions to ask your health care provider Can you help me check my risk for a fall? Do any of my medicines make me more likely to fall? Should I take a vitamin D supplement? What exercises can I do to improve my strength and balance? Should I make an appointment to have my vision checked? Do I need a bone density test to check for weak bones (osteoporosis)? Would it help to use a cane or a walker? Where to find more information Centers for Disease Control and Prevention, STEADI: TonerPromos.no Community-Based Fall Prevention Programs: TonerPromos.no [employer] on Aging: BaseRingTones.pl Contact a health care provider if: You fall at home. You are afraid of falling at home. You feel weak, drowsy, or dizzy. This information is not intended to replace advice given to you by your health care provider. Make sure you discuss any questions you have with your health care provider. Document Revised: 12/14/2021 Document Reviewed:  12/14/2021 Elsevier Patient Education  2024 [employer].

## 2023-08-05 NOTE — Progress Notes (Signed)
 Because this visit was a virtual/telehealth visit,  certain criteria was not obtained, such a blood pressure, CBG if applicable, and timed get up and go. Any medications not marked as "taking" were not mentioned during the medication reconciliation part of the visit. Any vitals not documented were not able to be obtained due to this being a telehealth visit or patient was unable to self-report a recent blood pressure reading due to a lack of equipment at home via telehealth. Vitals that have been documented are verbally provided by the patient.   Subjective:   Toni Francis is a 68 y.o. who presents for a Medicare Wellness preventive visit.  Visit Complete: Virtual I connected with  Toni Francis on 08/05/23 by a audio enabled telemedicine application and verified that I am speaking with the correct person using two identifiers.  Patient Location: Home  Provider Location: Home Office  I discussed the limitations of evaluation and management by telemedicine. The patient expressed understanding and agreed to proceed.  Vital Signs: Because this visit was a virtual/telehealth visit, some criteria may be missing or patient reported. Any vitals not documented were not able to be obtained and vitals that have been documented are patient reported.  VideoDeclined- This patient declined Librarian, academic. Therefore the visit was completed with audio only.  Persons Participating in Visit: Patient.  AWV Questionnaire: No: Patient Medicare AWV questionnaire was not completed prior to this visit.  Cardiac Risk Factors include: advanced age (>84men, >76 women);obesity (BMI >30kg/m2);sedentary lifestyle;hypertension     Objective:    Today's Vitals   08/05/23 1133  Weight: 260 lb (117.9 kg)  Height: 5\' 7"  (1.702 m)   Body mass index is 40.72 kg/m.     08/05/2023   11:55 AM 04/27/2022    9:28 AM 01/25/2020    8:40 PM 08/13/2019   12:35 PM 08/09/2019    1:46 PM   Advanced Directives  Does Patient Have a Medical Advance Directive? No No No No No  Would patient like information on creating a medical advance directive? No - Patient declined No - Patient declined No - Patient declined No - Patient declined Yes (MAU/Ambulatory/Procedural Areas - Information given)    Current Medications (verified) Outpatient Encounter Medications as of 08/05/2023  Medication Sig   acetaminophen (TYLENOL) 500 MG tablet Take 1,000 mg by mouth daily as needed for moderate pain or headache.   celecoxib (CELEBREX) 200 MG capsule Take 1 capsule (200 mg total) by mouth 2 (two) times daily.   Cholecalciferol (VITAMIN D3) 25 MCG (1000 UT) CAPS Take 2,000 Units by mouth daily.   cyclobenzaprine (FLEXERIL) 10 MG tablet Take 1 tablet (10 mg total) by mouth 3 (three) times daily as needed for muscle spasms.   fluticasone (FLONASE) 50 MCG/ACT nasal spray Place 2 sprays into both nostrils daily. USE 2 SPRAY(S) IN EACH NOSTRIL TWICE DAILY   levocetirizine (XYZAL) 5 MG tablet TAKE 1 TABLET BY MOUTH ONCE DAILY IN THE EVENING   levothyroxine (SYNTHROID) 75 MCG tablet Take 1 tablet (75 mcg total) by mouth daily.   meloxicam (MOBIC) 15 MG tablet Take 15 mg by mouth daily.   Multiple Vitamins-Minerals (CENTRUM SILVER PO) Take 1 tablet by mouth daily.   Multiple Vitamins-Minerals (EYE VITAMINS PO) Take 1 capsule by mouth daily.   Nutritional Supplements (VITAMIN D BOOSTER PO) Take by mouth.   Omega-3 Fatty Acids (FISH OIL PO) Take 1 capsule by mouth daily.    omeprazole (PRILOSEC) 20 MG capsule Take  1 capsule by mouth once daily   triamterene-hydrochlorothiazide (MAXZIDE-25) 37.5-25 MG tablet Take 1 tablet by mouth daily.   ZINC OXIDE PO Take by mouth.   azithromycin (ZITHROMAX Z-PAK) 250 MG tablet As directed (Patient not taking: Reported on 08/05/2023)   furosemide (LASIX) 20 MG tablet Take 1 tablet (20 mg total) by mouth daily. (Patient not taking: Reported on 08/05/2023)   lisinopril  (ZESTRIL) 20 MG tablet Take 1 tablet (20 mg total) by mouth daily. (Patient not taking: Reported on 08/05/2023)   triamcinolone (KENALOG) 0.025 % cream Apply 1 Application topically 2 (two) times daily. (Patient not taking: Reported on 08/05/2023)   [DISCONTINUED] Ascorbic Acid (VITAMIN C) 500 MG CAPS Take by mouth. (Patient not taking: Reported on 08/05/2023)   [DISCONTINUED] Zinc Oxide-Vitamin C (ZINC PLUS VITAMIN C PO) Take 1 tablet by mouth daily. (Patient not taking: Reported on 08/05/2023)   Facility-Administered Encounter Medications as of 08/05/2023  Medication   promethazine (PHENERGAN) injection 25 mg    Allergies (verified) Ampicillin, Codeine, and Pseudoephedrine   History: Past Medical History:  Diagnosis Date   Abdominal hernia    Arthritis    GERD (gastroesophageal reflux disease)    Hypertension    Pinched nerve    L4-5,back   Past Surgical History:  Procedure Laterality Date   CHOLECYSTECTOMY     THYROIDECTOMY Right 08/13/2019   Procedure: RIGHT THYROID LOBECTOMY;  Surgeon: Drema Halon, MD;  [facility];  Service: ENT;  Laterality: Right;   Family History  Problem Relation Age of Onset   Colon cancer Maternal Uncle 50   Rectal cancer Neg Hx    Stomach cancer Neg Hx    Social History   Socioeconomic History   Marital status: Divorced    Spouse name: Not on file   Number of children: 2   Years of education: 12   Highest education level: Some college, no degree  Occupational History    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, therapeutic   Occupation: Retired  Tobacco Use   Smoking status: Never   Smokeless tobacco: Never  Vaping Use   Vaping status: Never Used  Substance and Sexual Activity   Alcohol use: No   Drug use: No   Sexual activity: Not Currently  Other Topics Concern   Not on file  Social History Narrative   Not on file   Social Drivers of Health   Financial Resource Strain: Low Risk  (08/05/2023)   Overall Financial Resource Strain (CARDIA)     Difficulty of Paying Living Expenses: Not hard at all  Food Insecurity: No Food Insecurity (08/05/2023)   Hunger Vital Sign    Worried About Running Out of Food in the Last Year: Never true    Ran Out of Food in the Last Year: Never true  Transportation Needs: No Transportation Needs (08/05/2023)   PRAPARE - Administrator, Civil Service (Medical): No    Lack of Transportation (Non-Medical): No  Physical Activity: Inactive (08/05/2023)   Exercise Vital Sign    Days of Exercise per Week: 0 days    Minutes of Exercise per Session: 0 min  Stress: No Stress Concern Present (08/05/2023)   [employer] of Occupational Health - Occupational Stress Questionnaire    Feeling of Stress : Not at all  Social Connections: Moderately Integrated (08/05/2023)   Social Connection and Isolation Panel [NHANES]    Frequency of Communication with Friends and Family: More than three times a week    Frequency of Social  Gatherings with Friends and Family: Once a week    Attends Religious Services: More than 4 times per year    Active Member of [employer] or Organizations: Yes    Attends Engineer, structural: More than 4 times per year    Marital Status: Divorced    Tobacco Counseling Counseling given: Yes    Clinical Intake:  Pre-visit preparation completed: Yes  Pain : No/denies pain     BMI - recorded: 40.72 Nutritional Risks: None Diabetes: No  No results found for: "HGBA1C"   How often do you need to have someone help you when you read instructions, pamphlets, or other written materials from your doctor or pharmacy?: 1 - Never  Interpreter Needed?: No      Activities of Daily Living     08/05/2023   11:55 AM  In your present state of health, do you have any difficulty performing the following activities:  Hearing? 0  Vision? 0  Difficulty concentrating or making decisions? 0  Walking or climbing stairs? 1  Comment due to chronic low back pain  Dressing or  bathing? 0  Doing errands, shopping? 0  Preparing Food and eating ? N  Using the Toilet? N  In the past six months, have you accidently leaked urine? N  Do you have problems with loss of bowel control? N  Managing your Medications? N  Managing your Finances? N  Housekeeping or managing your Housekeeping? N    Patient Care Team: Bennie Pierini, FNP as PCP - General (Family Medicine) [facility] (Ophthalmology) Candace Gallus as Physician Assistant (Neurosurgery) Renaldo Fiddler, MD as Consulting Physician (Pain Medicine) Evern Bio, Dois Davenport, NP as Nurse Practitioner (Dermatology) Sherian Rein, MD as Consulting Physician (Obstetrics and Gynecology) Hortencia Pilar, NP as Nurse Practitioner (Nurse Practitioner) Ernesto Rutherford, MD as Referring Physician (Ophthalmology)  Indicate any recent Medical Services you may have received from other than [facility] providers in the past year (date may be approximate).     Assessment:   This is a routine wellness examination for Yerania.  Hearing/Vision screen Hearing Screening - Comments:: Patient denies any hearing difficulties.   Vision Screening - Comments:: Wears rx glasses - up to date with routine eye exams  Sees Dr. Dione Booze in [place]   Goals Addressed             This Visit's Progress    DIET - EAT MORE FRUITS AND VEGETABLES   On track    Exercise 150 min/wk Moderate Activity   On track    Patient Stated   On track    I'd like to do some traveling       Depression Screen     08/05/2023   11:57 AM 08/01/2023    4:06 PM 02/21/2023    2:03 PM 01/06/2023   10:34 AM 12/01/2022   10:04 AM 09/06/2022   10:30 AM 04/22/2022    8:38 AM  PHQ 2/9 Scores  PHQ - 2 Score 0 0 0 0 0 0 0  PHQ- 9 Score 0  0 0 0  0    Fall Risk     08/05/2023   11:55 AM 08/01/2023    4:06 PM 02/21/2023    2:03 PM 01/06/2023   10:34 AM 12/01/2022   10:04 AM  Fall Risk   Falls in the past year? 0 0  1 0 0  Number falls in past yr: 0  0    Injury with Fall?  0  1    Risk for fall due to : No Fall Risks  Impaired balance/gait    Follow up Falls prevention discussed;Falls evaluation completed  Falls evaluation completed      MEDICARE RISK AT HOME:  Medicare Risk at Home Any stairs in or around the home?: Yes If so, are there any without handrails?: Yes (basement stairs don't have a handrail but patient states she absolutely does not go into the basement) Home free of loose throw rugs in walkways, pet beds, electrical cords, etc?: Yes Adequate lighting in your home to reduce risk of falls?: Yes Life alert?: No Use of a cane, walker or w/c?: No Grab bars in the bathroom?: No Shower chair or bench in shower?: Yes (pt states shower chair is for father. she does not need one) Elevated toilet seat or a handicapped toilet?: No  TIMED UP AND GO:  Was the test performed?  No  Cognitive Function: 6CIT completed    04/27/2022    9:29 AM  MMSE - Mini Mental State Exam  Orientation to time 5  Orientation to Place 5  Registration 3  Attention/ Calculation 5  Recall 2  Language- name 2 objects 2  Language- repeat 1  Language- follow 3 step command 3  Language- read & follow direction 1  Write a sentence 1  Copy design 1  Total score 29        08/05/2023   11:54 AM  6CIT Screen  What Year? 0 points  What month? 0 points  What time? 0 points  Count back from 20 0 points  Months in reverse 0 points  Repeat phrase 0 points  Total Score 0 points    Immunizations Immunization History  Administered Date(s) Administered   Influenza,inj,Quad PF,6+ Mos 02/08/2019   PFIZER(Purple Top)SARS-COV-2 Vaccination 06/23/2019, 07/14/2019   Tdap 02/22/2012, 01/06/2023   Zoster, Live 02/22/2012    Screening Tests Health Maintenance  Topic Date Due   Zoster Vaccines- Shingrix (1 of 2) 08/23/1975   COVID-19 Vaccine (3 - Pfizer risk series) 08/11/2019   Pneumonia Vaccine 67+ Years old (1  of 1 - PCV) 10/25/2023 (Originally 08/23/2006)   Hepatitis C Screening  10/25/2023 (Originally 08/23/1974)   MAMMOGRAM  08/12/2023   INFLUENZA VACCINE  11/25/2023   Medicare Annual Wellness (AWV)  08/04/2024   DEXA SCAN  10/26/2024   Colonoscopy  02/23/2028   DTaP/Tdap/Td (3 - Td or Tdap) 01/05/2033   HPV VACCINES  Aged Out   Meningococcal B Vaccine  Aged Out    Health Maintenance  Health Maintenance Due  Topic Date Due   Zoster Vaccines- Shingrix (1 of 2) 08/23/1975   COVID-19 Vaccine (3 - Pfizer risk series) 08/11/2019   Health Maintenance Items Addressed: Patient was advised of the recommended vaccinations that they are due for and where they can get those done at with verbal understanding.   Mammogram is scheduled for 08/17/2023  Additional Screening:  Vision Screening: Recommended annual ophthalmology exams for early detection of glaucoma and other disorders of the eye.  Dental Screening: Recommended annual dental exams for proper oral hygiene  Community Resource Referral / Chronic Care Management: CRR required this visit?  No   CCM required this visit?  No     Plan:     I have personally reviewed and noted the following in the patient's chart:   Medical and social history Use of alcohol, tobacco or illicit drugs  Current medications and supplements including opioid prescriptions. Patient is not  currently taking opioid prescriptions. Functional ability and status Nutritional status Physical activity Advanced directives List of other physicians Hospitalizations, surgeries, and ER visits in previous 12 months Vitals Screenings to include cognitive, depression, and falls Referrals and appointments  In addition, I have reviewed and discussed with patient certain preventive protocols, quality metrics, and best practice recommendations. A written personalized care plan for preventive services as well as general preventive health recommendations were provided to  patient.     Jordan Hawks Keyonni Percival, CMA   08/05/2023   After Visit Summary: (MyChart) Due to this being a telephonic visit, the after visit summary with patients personalized plan was offered to patient via MyChart   Notes: Nothing significant to report at this time.

## 2023-08-08 ENCOUNTER — Telehealth (INDEPENDENT_AMBULATORY_CARE_PROVIDER_SITE_OTHER): Admitting: Family Medicine

## 2023-08-08 ENCOUNTER — Ambulatory Visit: Payer: Self-pay

## 2023-08-08 ENCOUNTER — Encounter: Payer: Self-pay | Admitting: Family Medicine

## 2023-08-08 DIAGNOSIS — J0100 Acute maxillary sinusitis, unspecified: Secondary | ICD-10-CM

## 2023-08-08 MED ORDER — CEFDINIR 300 MG PO CAPS: 300.0000 mg | ORAL_CAPSULE | Freq: Two times a day (BID) | ORAL | 0 refills | Status: AC

## 2023-08-08 NOTE — Telephone Encounter (Signed)
  Chief Complaint: reporting imaging result Symptoms: NA Frequency: NA Pertinent Negatives: Patient denies NA Disposition: [] ED /[] Urgent Care (no appt availability in office) / [] Appointment(In office/virtual)/ []  [place] [facility]/ [] Home Care/ [] Refused Recommended Disposition /[] [place] [facility]/ [x]  Follow-up with PCP Additional Notes:  Toni Francis with [facility] radiology. Reporting right foot xray shows acute fracture, signed by Toni Francis.   Copied from CRM [id number]. Topic: Clinical - Lab/Test Results >> Aug 08, 2023  5:16 PM Toni Francis wrote: Reason for CRM: Toni Francis with Radiology requests to speak with clinical staff. Reason for Disposition . Lab or radiology calling with test results  Protocols used: PCP Call - No Triage-A-AH

## 2023-08-08 NOTE — Progress Notes (Signed)
   Virtual Visit via video Note   Due to COVID-19 pandemic this visit was conducted virtually. This visit type was conducted due to national recommendations for restrictions regarding the COVID-19 Pandemic (e.g. social distancing, sheltering in place) in an effort to limit this patient's exposure and mitigate transmission in our community. All issues noted in this document were discussed and addressed.  A physical exam was not performed with this format.  I connected with  Toni Francis  on 08/08/23 at 1512 by video and verified that I am speaking with the correct person using two identifiers. Toni Francis is currently located at home and no one is currently with her during the visit. The provider, Albertha Huger, FNP is located in their office at time of visit.  I discussed the limitations, risks, security and privacy concerns of performing an evaluation and management service by video  and the availability of in person appointments. I also discussed with the patient that there may be a patient responsible charge related to this service. The patient expressed understanding and agreed to proceed.  CC: sinusitis  History and Present Illness:  Toni Francis reports nasal congestion x 2 weeks. Worsening. Now with right frontal and maxillary pain and tenderness. Also with right ear pain. Has cough with yellow sputum. She denies fever, chills, chest pain, shortness of breath, or sore throat. She has tried mucinex, flonase, saline nasal spray, and a zpak without improvement.    ROS As per HPI.     Observations/Objective: Alert and oriented. Respirations unlabored. No cyanosis. Non toxic appearing. Normal mood and behavior.     Assessment and Plan: Toni Francis" was seen today for sinusitis.  Diagnoses and all orders for this visit:  Acute non-recurrent maxillary sinusitis Omnicef as below- has tolerated in the past. Return to office for new or worsening symptoms, or if symptoms persist.  -      cefdinir (OMNICEF) 300 MG capsule; Take 1 capsule (300 mg total) by mouth 2 (two) times daily for 7 days.     Follow Up Instructions: As needed.     I discussed the assessment and treatment plan with the patient. The patient was provided an opportunity to ask questions and all were answered. The patient agreed with the plan and demonstrated an understanding of the instructions.   The patient was advised to call back or seek an in-person evaluation if the symptoms worsen or if the condition fails to improve as anticipated.  The above assessment and management plan was discussed with the patient. The patient verbalized understanding of and has agreed to the management plan. Patient is aware to call the clinic if symptoms persist or worsen. Patient is aware when to return to the clinic for a follow-up visit. Patient educated on when it is appropriate to go to the emergency department.   Time call ended: 1519  I provided 7 minutes of face-to-face time during this encounter.    Albertha Huger, FNP

## 2023-08-09 NOTE — Telephone Encounter (Signed)
Patient of MMM. Please review and advise

## 2023-08-09 NOTE — Telephone Encounter (Signed)
 See result note.

## 2023-08-10 ENCOUNTER — Ambulatory Visit

## 2023-08-11 ENCOUNTER — Telehealth: Payer: Self-pay

## 2023-08-11 ENCOUNTER — Telehealth: Payer: Self-pay | Admitting: Nurse Practitioner

## 2023-08-11 NOTE — Telephone Encounter (Signed)
 Patient was told had her last visit that she does not need an ortho shoe/boot. Now that she has spoken with friend at church she is considering the shoe just for peace of mind. Pt does not really know what is best but wants the right treatment. Suggested that we can order whichever shoe/boot is needed and she can try it and see how she feels. Pt would like for the Rx to go to [facility].

## 2023-08-11 NOTE — Telephone Encounter (Signed)
 I recommended patient have boot. Do we have these here? If not I can place order for Ortho.

## 2023-08-11 NOTE — Telephone Encounter (Signed)
 Lmtcb

## 2023-08-11 NOTE — Telephone Encounter (Signed)
 Patient called requesting to speak directly with nurse [facility]. Says she was talking with a friend last night at church who works for Orth[employer] and says the friend told her that normally with a broken foot, they recommend wearing an Ortho shoe. Wants to discuss with [facility].

## 2023-08-11 NOTE — Telephone Encounter (Signed)
 R/C to [employer].

## 2023-08-11 NOTE — Telephone Encounter (Signed)
 Patient walked in and boot was given. Patient was very upset and rude to kim and I.

## 2023-08-17 ENCOUNTER — Ambulatory Visit
Admission: RE | Admit: 2023-08-17 | Discharge: 2023-08-17 | Disposition: A | Discharge status: Home / Self Care | Source: Ambulatory Visit | Attending: Nurse Practitioner | Admitting: Nurse Practitioner

## 2023-08-17 DIAGNOSIS — Z1231 Encounter for screening mammogram for malignant neoplasm of breast: Secondary | ICD-10-CM

## 2023-09-08 NOTE — Telephone Encounter (Signed)
 fyi

## 2023-09-12 ENCOUNTER — Ambulatory Visit (INDEPENDENT_AMBULATORY_CARE_PROVIDER_SITE_OTHER)

## 2023-09-12 ENCOUNTER — Encounter: Payer: Self-pay | Admitting: Nurse Practitioner

## 2023-09-12 ENCOUNTER — Ambulatory Visit: Admitting: Nurse Practitioner

## 2023-09-12 ENCOUNTER — Ambulatory Visit: Payer: Self-pay | Admitting: Nurse Practitioner

## 2023-09-12 VITALS — BP 136/78 | HR 76 | Temp 97.8°F | Ht 67.0 in | Wt 261.0 lb

## 2023-09-12 DIAGNOSIS — M79671 Pain in right foot: Secondary | ICD-10-CM | POA: Diagnosis not present

## 2023-09-12 DIAGNOSIS — S92352K Displaced fracture of fifth metatarsal bone, left foot, subsequent encounter for fracture with nonunion: Secondary | ICD-10-CM

## 2023-09-12 DIAGNOSIS — S92354A Nondisplaced fracture of fifth metatarsal bone, right foot, initial encounter for closed fracture: Secondary | ICD-10-CM

## 2023-09-12 NOTE — Progress Notes (Signed)
   Subjective:    Patient ID: Toni Francis, female    DOB: Dec 19, 1955, 68 y.o.   MRN: [id number]   Chief Complaint: Foot Pain (Right foot no better/)   Foot Pain Pertinent negatives include no abdominal pain, chest pain, diaphoresis, headaches, rash or weakness.    Patient in c/o right foot pain. Patient had fracture in right foot over a month ago. She was given a fracture shoe but she has not been wearing it all the time. Is some better but still hurts.  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  Eyes:  Negative for pain.  Respiratory:  Negative for shortness of breath.   Cardiovascular:  Negative for chest pain, palpitations and leg swelling.  Gastrointestinal:  Negative for abdominal pain.  Endocrine: Negative for polydipsia.  Skin:  Negative for rash.  Neurological:  Negative for dizziness, weakness and headaches.  Hematological:  Does not bruise/bleed easily.  All other systems reviewed and are negative.      Objective:   Physical Exam Musculoskeletal:     Comments: Pain on lateral side of right on palpation  Skin:    General: Skin is warm.  Neurological:     General: No focal deficit present.  Psychiatric:        Mood and Affect: Mood normal.        Behavior: Behavior normal.    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 Closed nondisplaced fracture of fifth metatarsal bone of right foot, initial encounter (Primary) - DG Foot Complete Right  2. 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 The patient verbalized understanding of and has agreed to the management plan. Patient is aware to call the clinic if symptoms persist or worsen. Patient is aware when to return to the clinic for a follow-up visit. Patient educated on when it is appropriate to go to the emergency department.   Mary-Margaret Gaylyn Keas, FNP

## 2023-09-13 ENCOUNTER — Ambulatory Visit: Admitting: Family

## 2023-09-13 ENCOUNTER — Encounter: Payer: Self-pay | Admitting: Family

## 2023-09-13 DIAGNOSIS — S92351D Displaced fracture of fifth metatarsal bone, right foot, subsequent encounter for fracture with routine healing: Secondary | ICD-10-CM | POA: Diagnosis not present

## 2023-09-13 NOTE — Progress Notes (Signed)
 Office Visit Note   Patient: Toni Francis           Date of Birth: Feb 04, 1956           MRN: [id number] Visit Date: 09/13/2023              Requested by: Delfina Feller, FNP [address] [place],  [place] [address] PCP: Delfina Feller, FNP  No chief complaint on file.     HPI: The patient is a 68 year old woman who is seen for initial evaluation of a base of the fifth metatarsal fracture on the right.  She initially injured her foot when she stepped off a curb and twisted her foot on April 5  She did have radiographs a few days after the initial injury which were not immediately read by radiology.  She has been in a postop shoe for the last several weeks she feels no pain with ambulation in the postop shoe when she wears a snug brace on her foot and ankle  Assessment & Plan: Visit Diagnoses: No diagnosis found.  Plan: Callus formation on radiographs we will continue with nonoperative management she will continue in her postop shoe she will follow-up in the office in 2 weeks for repeat radiographs Dr. Julio Ohm in agreement the plan  Follow-Up Instructions: Return in about 2 weeks (around 09/27/2023), or if symptoms worsen or fail to improve.   Ortho Exam  Patient is alert, oriented, no adenopathy, well-dressed, normal affect, normal respiratory effort. On examination right foot there is no edema no erythema no ecchymosis she has very mild tenderness to the base of the fifth metatarsal  Palpable dorsalis pedis pulse.  Imaging: DG Foot Complete Right Result Date: 09/12/2023 CLINICAL DATA:  Right fifth metatarsal fracture. EXAM: RIGHT FOOT COMPLETE - 3+ VIEW COMPARISON:  Right foot radiograph dated 08/01/2023 FINDINGS: Widening of the fracture of the base of the fifth metatarsal since the prior radiograph. No significant healing at this time. No new fracture or dislocation. The bones are osteopenic. The soft tissues are unremarkable. IMPRESSION: Widening of the fracture  of the base of the fifth metatarsal. Electronically Signed   By: Angus Bark M.D.   On: 09/12/2023 16:34   No images are attached to the encounter.  Labs: Lab Results  Component Value Date   CRP 1.1 (H) 01/29/2020   CRP 1.5 (H) 01/28/2020   CRP 3.2 (H) 01/27/2020   REPTSTATUS 01/29/2020 FINAL 01/24/2020   CULT  01/24/2020    NO GROWTH 5 DAYS Performed at [facility], [address] [place], [place] [address]      Lab Results  Component Value Date   ALBUMIN 4.1 10/25/2022   ALBUMIN 4.1 04/22/2022   ALBUMIN 4.0 10/19/2021    Lab Results  Component Value Date   MG 2.2 01/29/2020   MG 2.3 01/28/2020   MG 2.3 01/27/2020   Lab Results  Component Value Date   VD25OH 34.9 10/21/2017   VD25OH 40.6 04/15/2016   VD25OH 40.6 11/26/2014    No results found for: "PREALBUMIN"    Latest Ref Rng & Units 10/25/2022    9:37 AM 04/22/2022    9:12 AM 10/19/2021    8:47 AM  CBC EXTENDED  WBC 3.4 - 10.8 x10E3/uL 10.1  6.6  7.6   RBC 3.77 - 5.28 x10E6/uL 4.84  5.12  4.93   Hemoglobin 11.1 - 15.9 g/dL 82.9  56.2  13.0   HCT 34.0 - 46.6 % 42.1  43.8  40.1   Platelets 150 - 450 x10E3/uL 280  249  296   NEUT# 1.4 - 7.0 x10E3/uL 7.4  4.3  5.7   Lymph# 0.7 - 3.1 x10E3/uL 1.5  1.7  1.1      There is no height or weight on file to calculate BMI.  Orders:  No orders of the defined types were placed in this encounter.  No orders of the defined types were placed in this encounter.    Procedures: No procedures performed  Clinical Data: No additional findings.  ROS:  All other systems negative, except as noted in the HPI. Review of Systems  Objective: Vital Signs: There were no vitals taken for this visit.  Specialty Comments:  No specialty comments available.  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   Hypothyroidism 01/12/2021   GERD (gastroesophageal reflux disease) 10/08/2020    Morbid obesity (HCC) 03/06/2020   Hypertension    History of thyroid  cancer 08/13/2019   Allergic rhinitis 10/14/2015   Past Medical History:  Diagnosis Date   Abdominal hernia    Arthritis    GERD (gastroesophageal reflux disease)    Hypertension    Pinched nerve    L4-5,back    Family History  Problem Relation Age of Onset   Colon cancer Maternal Uncle 50   Rectal cancer Neg Hx    Stomach cancer Neg Hx     Past Surgical History:  Procedure Laterality Date   CHOLECYSTECTOMY     THYROIDECTOMY Right 08/13/2019   Procedure: RIGHT THYROID  LOBECTOMY;  Surgeon: Prescott Brodie, MD;  Location: [facility] OR;  Service: ENT;  Laterality: Right;   Social History   Occupational History    Employer: [employer]   Occupation: Retired  Tobacco Use   Smoking status: Never   Smokeless tobacco: Never  Vaping Use   Vaping status: Never Used  Substance and Sexual Activity   Alcohol use: No   Drug use: No   Sexual activity: Not Currently

## 2023-09-16 ENCOUNTER — Other Ambulatory Visit: Payer: Self-pay | Admitting: Nurse Practitioner

## 2023-09-16 DIAGNOSIS — J301 Allergic rhinitis due to pollen: Secondary | ICD-10-CM

## 2023-09-28 ENCOUNTER — Encounter: Payer: Self-pay | Admitting: Family

## 2023-09-28 ENCOUNTER — Other Ambulatory Visit (INDEPENDENT_AMBULATORY_CARE_PROVIDER_SITE_OTHER): Payer: Self-pay

## 2023-09-28 ENCOUNTER — Ambulatory Visit: Admitting: Family

## 2023-09-28 DIAGNOSIS — S92351D Displaced fracture of fifth metatarsal bone, right foot, subsequent encounter for fracture with routine healing: Secondary | ICD-10-CM

## 2023-09-28 NOTE — Progress Notes (Signed)
 Office Visit Note   Patient: Toni Francis           Date of Birth: February 20, 1956           MRN: [id number] Visit Date: 09/28/2023              Requested by: Delfina Feller, FNP [address] [place],  [place] [address] PCP: Delfina Feller, FNP  Chief Complaint  Patient presents with   Right Foot - Follow-up    5th MT fx       HPI: The patient is a 68 year old woman seen in follow-up for fifth metatarsal fracture right foot April 5 of this year.  She has only been placed in a postop shoe for the last 4 weeks she continues to have some throbbing pain which is intermittent.  She is full weightbearing in her postop shoe  Is pleased with her progress.  Assessment & Plan: Visit Diagnoses:  1. Closed fracture of base of fifth metatarsal bone with routine healing, right     Plan: She will continue her postop shoe.  Continue calcium  and vitamin D  supplements.  Plan to follow-up with repeat radiographs in 3 weeks hope to advance to regular shoewear at that time  Follow-Up Instructions: No follow-ups on file.   Ortho Exam  Patient is alert, oriented, no adenopathy, well-dressed, normal affect, normal respiratory effort. On examination right foot there is no edema no erythema she has minimal tenderness with palpation of the base of fifth metatarsal.    Palpable dorsalis pedis pulse.    Imaging: No results found. No images are attached to the encounter.  Labs: Lab Results  Component Value Date   CRP 1.1 (H) 01/29/2020   CRP 1.5 (H) 01/28/2020   CRP 3.2 (H) 01/27/2020   REPTSTATUS 01/29/2020 FINAL 01/24/2020   CULT  01/24/2020    NO GROWTH 5 DAYS Performed at [facility], [address], [place], [place] [address]      Lab Results  Component Value Date   ALBUMIN 4.1 10/25/2022   ALBUMIN 4.1 04/22/2022   ALBUMIN 4.0 10/19/2021    Lab Results  Component Value Date   MG 2.2 01/29/2020   MG 2.3 01/28/2020   MG 2.3 01/27/2020   Lab Results   Component Value Date   VD25OH 34.9 10/21/2017   VD25OH 40.6 04/15/2016   VD25OH 40.6 11/26/2014    No results found for: "PREALBUMIN"    Latest Ref Rng & Units 10/25/2022    9:37 AM 04/22/2022    9:12 AM 10/19/2021    8:47 AM  CBC EXTENDED  WBC 3.4 - 10.8 x10E3/uL 10.1  6.6  7.6   RBC 3.77 - 5.28 x10E6/uL 4.84  5.12  4.93   Hemoglobin 11.1 - 15.9 g/dL 01.0  27.2  53.6   HCT 34.0 - 46.6 % 42.1  43.8  40.1   Platelets 150 - 450 x10E3/uL 280  249  296   NEUT# 1.4 - 7.0 x10E3/uL 7.4  4.3  5.7   Lymph# 0.7 - 3.1 x10E3/uL 1.5  1.7  1.1      There is no height or weight on file to calculate BMI.  Orders:  Orders Placed This Encounter  Procedures   XR Foot Complete Right   No orders of the defined types were placed in this encounter.    Procedures: No procedures performed  Clinical Data: No additional findings.  ROS:  All other systems negative, except as noted in the HPI.  Review of Systems  Objective: Vital Signs: There were no vitals taken for this visit.  Specialty Comments:  No specialty comments available.  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   Hypothyroidism 01/12/2021   GERD (gastroesophageal reflux disease) 10/08/2020   Morbid obesity (HCC) 03/06/2020   Hypertension    History of thyroid  cancer 08/13/2019   Allergic rhinitis 10/14/2015   Past Medical History:  Diagnosis Date   Abdominal hernia    Arthritis    GERD (gastroesophageal reflux disease)    Hypertension    Pinched nerve    L4-5,back    Family History  Problem Relation Age of Onset   Colon cancer Maternal Uncle 50   Rectal cancer Neg Hx    Stomach cancer Neg Hx     Past Surgical History:  Procedure Laterality Date   CHOLECYSTECTOMY     THYROIDECTOMY Right 08/13/2019   Procedure: RIGHT THYROID  LOBECTOMY;  Surgeon: Prescott Brodie, MD;  Location: [facility] OR;  Service: ENT;  Laterality: Right;   Social  History   Occupational History    Employer: [employer]   Occupation: Retired  Tobacco Use   Smoking status: Never   Smokeless tobacco: Never  Vaping Use   Vaping status: Never Used  Substance and Sexual Activity   Alcohol use: No   Drug use: No   Sexual activity: Not Currently

## 2023-10-19 ENCOUNTER — Other Ambulatory Visit (INDEPENDENT_AMBULATORY_CARE_PROVIDER_SITE_OTHER): Payer: Self-pay

## 2023-10-19 ENCOUNTER — Ambulatory Visit: Admitting: Family

## 2023-10-19 ENCOUNTER — Encounter: Payer: Self-pay | Admitting: Family

## 2023-10-19 DIAGNOSIS — M79671 Pain in right foot: Secondary | ICD-10-CM

## 2023-10-19 NOTE — Progress Notes (Signed)
 Office Visit Note   Patient: Toni Francis           Date of Birth: 06-Apr-1956           MRN: [id number] Visit Date: 10/19/2023              Requested by: Gladis Mustard, FNP [address] [place],  [place] [address] PCP: Gladis Mustard, FNP  Chief Complaint  Patient presents with   Right Foot - Follow-up    5th MT fx       HPI: The patient is a 68 year old woman who is seen 2 and half months out from a base of the fifth metatarsal fracture.  She is currently in a postop shoe she reports minimal pain this is also intermittent she is feels she is doing well  Assessment & Plan: Visit Diagnoses:  1. Pain in right foot     Plan: Discussed base of the fifth metatarsal fracture offered ORIF.  Patient declined surgical intervention at this time she would like to continue with her postop shoe for 4 more weeks.  Repeat radiographs at follow-up  Follow-Up Instructions: No follow-ups on file.   Ortho Exam  Patient is alert, oriented, no adenopathy, well-dressed, normal affect, normal respiratory effort. On examination right foot the foot is plantigrade there is no erythema no edema she continues to have mild tenderness to palpation of the base of the fifth metatarsal there is a palpable dorsalis pedis pulse.    Imaging: No results found. No images are attached to the encounter.  Labs: Lab Results  Component Value Date   CRP 1.1 (H) 01/29/2020   CRP 1.5 (H) 01/28/2020   CRP 3.2 (H) 01/27/2020   REPTSTATUS 01/29/2020 FINAL 01/24/2020   CULT  01/24/2020    NO GROWTH 5 DAYS Performed at [facility], [address] [place], [place] [address]      Lab Results  Component Value Date   ALBUMIN 4.1 10/25/2022   ALBUMIN 4.1 04/22/2022   ALBUMIN 4.0 10/19/2021    Lab Results  Component Value Date   MG 2.2 01/29/2020   MG 2.3 01/28/2020   MG 2.3 01/27/2020   Lab Results  Component Value Date   VD25OH 34.9 10/21/2017   VD25OH 40.6 04/15/2016    VD25OH 40.6 11/26/2014    No results found for: PREALBUMIN    Latest Ref Rng & Units 10/25/2022    9:37 AM 04/22/2022    9:12 AM 10/19/2021    8:47 AM  CBC EXTENDED  WBC 3.4 - 10.8 x10E3/uL 10.1  6.6  7.6   RBC 3.77 - 5.28 x10E6/uL 4.84  5.12  4.93   Hemoglobin 11.1 - 15.9 g/dL 86.3  86.0  86.9   HCT 34.0 - 46.6 % 42.1  43.8  40.1   Platelets 150 - 450 x10E3/uL 280  249  296   NEUT# 1.4 - 7.0 x10E3/uL 7.4  4.3  5.7   Lymph# 0.7 - 3.1 x10E3/uL 1.5  1.7  1.1      There is no height or weight on file to calculate BMI.  Orders:  Orders Placed This Encounter  Procedures   XR Foot Complete Right   No orders of the defined types were placed in this encounter.    Procedures: No procedures performed  Clinical Data: No additional findings.  ROS:  All other systems negative, except as noted in the HPI. Review of Systems  Objective: Vital Signs: There were no vitals taken for  this visit.  Specialty Comments:  No specialty comments available.  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   Hypothyroidism 01/12/2021   GERD (gastroesophageal reflux disease) 10/08/2020   Morbid obesity (HCC) 03/06/2020   Hypertension    History of thyroid  cancer 08/13/2019   Allergic rhinitis 10/14/2015   Past Medical History:  Diagnosis Date   Abdominal hernia    Arthritis    GERD (gastroesophageal reflux disease)    Hypertension    Pinched nerve    L4-5,back    Family History  Problem Relation Age of Onset   Colon cancer Maternal Uncle 50   Rectal cancer Neg Hx    Stomach cancer Neg Hx     Past Surgical History:  Procedure Laterality Date   CHOLECYSTECTOMY     THYROIDECTOMY Right 08/13/2019   Procedure: RIGHT THYROID  LOBECTOMY;  Surgeon: Ethyl Lonni BRAVO, MD;  Location: [facility] OR;  Service: ENT;  Laterality: Right;   Social History   Occupational History    Employer: [employer]    Occupation: Retired  Tobacco Use   Smoking status: Never   Smokeless tobacco: Never  Vaping Use   Vaping status: Never Used  Substance and Sexual Activity   Alcohol use: No   Drug use: No   Sexual activity: Not Currently

## 2023-10-25 ENCOUNTER — Other Ambulatory Visit: Payer: Self-pay | Admitting: Nurse Practitioner

## 2023-10-25 DIAGNOSIS — K219 Gastro-esophageal reflux disease without esophagitis: Secondary | ICD-10-CM

## 2023-11-22 ENCOUNTER — Other Ambulatory Visit: Payer: Self-pay | Admitting: Nurse Practitioner

## 2023-11-22 DIAGNOSIS — K219 Gastro-esophageal reflux disease without esophagitis: Secondary | ICD-10-CM

## 2023-11-22 MED ORDER — OMEPRAZOLE 20 MG PO CPDR: 20.0000 mg | DELAYED_RELEASE_CAPSULE | Freq: Every day | ORAL | 0 refills | Status: DC

## 2023-11-22 NOTE — Telephone Encounter (Signed)
 MMM pt NTBS 30-d given 10/25/23

## 2023-11-22 NOTE — Addendum Note (Signed)
 Addended by: INA CRAVEN D on: 11/22/2023 10:19 AM   Modules accepted: Orders

## 2023-11-22 NOTE — Telephone Encounter (Signed)
 I called pt & made her an appt w/MMM on 12-01-2023 for med refill.

## 2023-11-23 ENCOUNTER — Ambulatory Visit: Admitting: Family

## 2023-11-23 ENCOUNTER — Encounter: Payer: Self-pay | Admitting: Family

## 2023-11-23 ENCOUNTER — Other Ambulatory Visit (INDEPENDENT_AMBULATORY_CARE_PROVIDER_SITE_OTHER): Payer: Self-pay

## 2023-11-23 DIAGNOSIS — S92351D Displaced fracture of fifth metatarsal bone, right foot, subsequent encounter for fracture with routine healing: Secondary | ICD-10-CM | POA: Diagnosis not present

## 2023-11-23 NOTE — Progress Notes (Signed)
 Office Visit Note   Patient: Toni Francis           Date of Birth: [birth year]           MRN: [id number] Visit Date: 11/23/2023              Requested by: Gladis Mustard, FNP [address] [place],  [place] [address] PCP: Gladis Mustard, FNP  Chief Complaint  Patient presents with   Right Foot - Fracture, Follow-up      HPI: The patient is a 68 year old woman who presents in follow-up for base of fifth metatarsal fracture on the right she has been in a postop shoe she feels the pain to the base the fifth metatarsal area has largely resolved she continues with some pain mainly to her fifth toe she has been taping her foot attempting to buddy tape the toe she feels that she has shooting pain even at rest that the toe is sliding back and folding under   Assessment & Plan: Visit Diagnoses:  1. Closed fracture of base of fifth metatarsal bone with routine healing, right     Plan: Reassurance provided fifth metatarsal fracture is healing she may resume regular shoewear advance her activities as tolerated  Follow-Up Instructions: No follow-ups on file.   Ortho Exam  Patient is alert, oriented, no adenopathy, well-dressed, normal affect, normal respiratory effort. On examination right foot the toes are straight the foot is plantigrade there is no edema no erythema she has very minimal tenderness to deep palpation of the fifth metatarsal base.  Palpable dorsalis pedis pulse.    Imaging: No results found. No images are attached to the encounter.  Labs: Lab Results  Component Value Date   CRP 1.1 (H) 01/29/2020   CRP 1.5 (H) 01/28/2020   CRP 3.2 (H) 01/27/2020   REPTSTATUS 01/29/2020 FINAL 01/24/2020   CULT  01/24/2020    NO GROWTH 5 DAYS Performed at [facility], [address], [place], [place] [address]      Lab Results  Component Value Date   ALBUMIN 4.1 10/25/2022   ALBUMIN 4.1 04/22/2022   ALBUMIN 4.0 10/19/2021    Lab Results   Component Value Date   MG 2.2 01/29/2020   MG 2.3 01/28/2020   MG 2.3 01/27/2020   Lab Results  Component Value Date   VD25OH 34.9 10/21/2017   VD25OH 40.6 04/15/2016   VD25OH 40.6 11/26/2014    No results found for: PREALBUMIN    Latest Ref Rng & Units 10/25/2022    9:37 AM 04/22/2022    9:12 AM 10/19/2021    8:47 AM  CBC EXTENDED  WBC 3.4 - 10.8 x10E3/uL 10.1  6.6  7.6   RBC 3.77 - 5.28 x10E6/uL 4.84  5.12  4.93   Hemoglobin 11.1 - 15.9 g/dL 86.3  86.0  86.9   HCT 34.0 - 46.6 % 42.1  43.8  40.1   Platelets 150 - 450 x10E3/uL 280  249  296   NEUT# 1.4 - 7.0 x10E3/uL 7.4  4.3  5.7   Lymph# 0.7 - 3.1 x10E3/uL 1.5  1.7  1.1      There is no height or weight on file to calculate BMI.  Orders:  Orders Placed This Encounter  Procedures   XR Foot Complete Right   No orders of the defined types were placed in this encounter.    Procedures: No procedures performed  Clinical Data: No additional findings.  ROS:  All other  systems negative, except as noted in the HPI. Review of Systems  Objective: Vital Signs: There were no vitals taken for this visit.  Specialty Comments:  No specialty comments available.  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   Hypothyroidism 01/12/2021   GERD (gastroesophageal reflux disease) 10/08/2020   Morbid obesity (HCC) 03/06/2020   Hypertension    History of thyroid  cancer 08/13/2019   Allergic rhinitis 10/14/2015   Past Medical History:  Diagnosis Date   Abdominal hernia    Arthritis    GERD (gastroesophageal reflux disease)    Hypertension    Pinched nerve    L4-5,back    Family History  Problem Relation Age of Onset   Colon cancer Maternal Uncle 50   Rectal cancer Neg Hx    Stomach cancer Neg Hx     Past Surgical History:  Procedure Laterality Date   CHOLECYSTECTOMY     THYROIDECTOMY Right 08/13/2019   Procedure: RIGHT THYROID   LOBECTOMY;  Surgeon: Ethyl Lonni BRAVO, MD;  Location: [facility] OR;  Service: ENT;  Laterality: Right;   Social History   Occupational History    Employer: [employer]   Occupation: Retired  Tobacco Use   Smoking status: Never   Smokeless tobacco: Never  Vaping Use   Vaping status: Never Used  Substance and Sexual Activity   Alcohol use: No   Drug use: No   Sexual activity: Not Currently

## 2023-11-25 ENCOUNTER — Telehealth: Payer: Self-pay | Admitting: Family

## 2023-12-01 ENCOUNTER — Ambulatory Visit: Admitting: Nurse Practitioner

## 2023-12-01 ENCOUNTER — Encounter: Payer: Self-pay | Admitting: Nurse Practitioner

## 2023-12-01 VITALS — BP 132/82 | HR 71 | Temp 97.9°F | Ht 67.0 in | Wt 257.0 lb

## 2023-12-01 DIAGNOSIS — Z87440 Personal history of urinary (tract) infections: Secondary | ICD-10-CM

## 2023-12-01 DIAGNOSIS — J301 Allergic rhinitis due to pollen: Secondary | ICD-10-CM | POA: Diagnosis not present

## 2023-12-01 DIAGNOSIS — K219 Gastro-esophageal reflux disease without esophagitis: Secondary | ICD-10-CM | POA: Diagnosis not present

## 2023-12-01 DIAGNOSIS — I10 Essential (primary) hypertension: Secondary | ICD-10-CM

## 2023-12-01 DIAGNOSIS — M5441 Lumbago with sciatica, right side: Secondary | ICD-10-CM | POA: Diagnosis not present

## 2023-12-01 DIAGNOSIS — G8929 Other chronic pain: Secondary | ICD-10-CM | POA: Diagnosis not present

## 2023-12-01 DIAGNOSIS — E890 Postprocedural hypothyroidism: Secondary | ICD-10-CM

## 2023-12-01 LAB — URINALYSIS, COMPLETE
Bilirubin, UA: NEGATIVE
Glucose, UA: NEGATIVE
Ketones, UA: NEGATIVE
Nitrite, UA: NEGATIVE
Protein,UA: NEGATIVE
RBC, UA: NEGATIVE
Specific Gravity, UA: 1.02 (ref 1.005–1.030)
Urobilinogen, Ur: 0.2 mg/dL (ref 0.2–1.0)
pH, UA: 5.5 (ref 5.0–7.5)

## 2023-12-01 LAB — MICROSCOPIC EXAMINATION
Epithelial Cells (non renal): >10 /HPF — AB (ref 0–10)
RBC, Urine: NONE SEEN /HPF (ref 0–2)
Renal Epithel, UA: NONE SEEN /HPF
Yeast, UA: NONE SEEN

## 2023-12-01 LAB — BAYER DCA HB A1C WAIVED: HB A1C (BAYER DCA - WAIVED): 5.5 % (ref 4.8–5.6)

## 2023-12-01 LAB — LIPID PANEL

## 2023-12-01 MED ORDER — KETOROLAC TROMETHAMINE 60 MG/2ML IM SOLN
60.0000 mg | Freq: Once | INTRAMUSCULAR | Status: AC
  Administered 2023-12-01: 60 mg via INTRAMUSCULAR

## 2023-12-01 MED ORDER — METHYLPREDNISOLONE ACETATE 80 MG/ML IJ SUSP
80.0000 mg | Freq: Once | INTRAMUSCULAR | Status: AC
  Administered 2023-12-01: 80 mg via INTRAMUSCULAR

## 2023-12-01 MED ORDER — TRIAMTERENE-HCTZ 37.5-25 MG PO TABS: 1.0000 | ORAL_TABLET | Freq: Every day | ORAL | 1 refills | Status: DC

## 2023-12-01 MED ORDER — OMEPRAZOLE 20 MG PO CPDR: 20.0000 mg | DELAYED_RELEASE_CAPSULE | Freq: Every day | ORAL | 1 refills | Status: DC

## 2023-12-01 MED ORDER — LEVOTHYROXINE SODIUM 75 MCG PO TABS: 75.0000 ug | ORAL_TABLET | Freq: Every day | ORAL | 1 refills | Status: DC

## 2023-12-01 NOTE — Progress Notes (Addendum)
 Subjective:    Patient ID: Toni Francis, female    DOB: May 08, 1955, 68 y.o.   MRN: [id number]   Chief Complaint: medical management of chronic issues     HPI:  Toni Francis is a 68 y.o. who identifies as a female who was assigned female at birth.   Social history: Lives with: dad and brother Work history: retired   Water engineer in today for follow up of the following chronic medical issues:  1. Primary hypertension No c/o chest pain, sob or headache. Does not check blood pressure at home very often. BP Readings from Last 3 Encounters:  09/12/23 136/78  08/01/23 (!) 141/81  07/12/23 139/87      2. Postoperative hypothyroidism No problems that she is aware of Lab Results  Component Value Date   TSH 4.160 10/25/2022     3. Gastroesophageal reflux disease without esophagitis Is on omperazole daily and is doing well.  4. Seasonal allergic rhinitis due to pollen Use flonase  and xyzal  daily.   5. Morbid obesity (HCC) Weight is down 4lbs  Wt Readings from Last 3 Encounters:  12/01/23 257 lb (116.6 kg)  09/12/23 261 lb (118.4 kg)  08/05/23 260 lb (117.9 kg)   BMI Readings from Last 3 Encounters:  12/01/23 40.25 kg/m  09/12/23 40.88 kg/m  08/05/23 40.72 kg/m      New complaints: Right low back pain- has seen chiropractor- some better- rates pain 5/10 today. No lowe ext weakness. Standing and walking increases pain. Hx of UTI- wants urine checked today Allergies  Allergen Reactions   Ampicillin Rash    Did it involve swelling of the face/tongue/throat, SOB, or low BP? No Did it involve sudden or severe rash/hives, skin peeling, or any reaction on the inside of your mouth or nose? Yes Did you need to seek medical attention at a hospital or doctor's office? Yes When did it last happen?      10 + years If all above answers are "NO", may proceed with cephalosporin use.    Codeine Nausea And Vomiting and Other (See Comments)    Also causes headaches     Pseudoephedrine  Palpitations   Outpatient Encounter Medications as of 12/01/2023  Medication Sig   acetaminophen  (TYLENOL ) 500 MG tablet Take 1,000 mg by mouth daily as needed for moderate pain or headache.   celecoxib  (CELEBREX ) 200 MG capsule Take 1 capsule (200 mg total) by mouth 2 (two) times daily.   Cholecalciferol (VITAMIN D3) 25 MCG (1000 UT) CAPS Take 2,000 Units by mouth daily.   cyclobenzaprine  (FLEXERIL ) 10 MG tablet Take 1 tablet (10 mg total) by mouth 3 (three) times daily as needed for muscle spasms.   fluticasone  (FLONASE ) 50 MCG/ACT nasal spray USE 2 SPRAY(S) IN EACH NOSTRIL ONCE TO TWICE DAILY   furosemide  (LASIX ) 20 MG tablet Take 1 tablet (20 mg total) by mouth daily.   levocetirizine (XYZAL ) 5 MG tablet TAKE 1 TABLET BY MOUTH ONCE DAILY IN THE EVENING   levothyroxine  (SYNTHROID ) 75 MCG tablet Take 1 tablet (75 mcg total) by mouth daily.   lisinopril  (ZESTRIL ) 20 MG tablet Take 1 tablet (20 mg total) by mouth daily.   meloxicam  (MOBIC ) 15 MG tablet Take 15 mg by mouth daily.   Multiple Vitamins-Minerals (CENTRUM SILVER PO) Take 1 tablet by mouth daily.   Multiple Vitamins-Minerals (EYE VITAMINS PO) Take 1 capsule by mouth daily.   Nutritional Supplements (VITAMIN D  BOOSTER PO) Take by mouth.   Omega-3 Fatty Acids (FISH OIL  PO) Take 1 capsule by mouth daily.    omeprazole  (PRILOSEC) 20 MG capsule Take 1 capsule (20 mg total) by mouth daily.   triamcinolone  (KENALOG ) 0.025 % cream Apply 1 Application topically 2 (two) times daily.   triamterene -hydrochlorothiazide  (MAXZIDE -25) 37.5-25 MG tablet Take 1 tablet by mouth daily.   ZINC  OXIDE PO Take by mouth.   Facility-Administered Encounter Medications as of 12/01/2023  Medication   promethazine  (PHENERGAN ) injection 25 mg    Past Surgical History:  Procedure Laterality Date   CHOLECYSTECTOMY     THYROIDECTOMY Right 08/13/2019   Procedure: RIGHT THYROID  LOBECTOMY;  Surgeon: Ethyl Lonni BRAVO, MD;  Location: [facility] OR;   Service: ENT;  Laterality: Right;    Family History  Problem Relation Age of Onset   Colon cancer Maternal Uncle 50   Rectal cancer Neg Hx    Stomach cancer Neg Hx       Controlled substance contract: n/a     Review of Systems  Constitutional:  Negative for diaphoresis.  Eyes:  Negative for pain.  Respiratory:  Negative for shortness of breath.   Cardiovascular:  Negative for chest pain, palpitations and leg swelling.  Gastrointestinal:  Negative for abdominal pain.  Endocrine: Negative for polydipsia.  Skin:  Negative for rash.  Neurological:  Negative for dizziness, weakness and headaches.  Hematological:  Does not bruise/bleed easily.  All other systems reviewed and are negative.      Objective:   Physical Exam Vitals and nursing note reviewed.  Constitutional:      General: She is not in acute distress.    Appearance: Normal appearance. She is well-developed.  HENT:     Head: Normocephalic.     Right Ear: Tympanic membrane normal.     Left Ear: Tympanic membrane normal.     Nose: Nose normal.     Mouth/Throat:     Mouth: Mucous membranes are moist.  Eyes:     Pupils: Pupils are equal, round, and reactive to light.  Neck:     Vascular: No carotid bruit or JVD.  Cardiovascular:     Rate and Rhythm: Normal rate and regular rhythm.     Heart sounds: Normal heart sounds.  Pulmonary:     Effort: Pulmonary effort is normal. No respiratory distress.     Breath sounds: Normal breath sounds. No wheezing or rales.  Chest:     Chest wall: No tenderness.  Abdominal:     General: Bowel sounds are normal. There is no distension or abdominal bruit.     Palpations: Abdomen is soft. There is no hepatomegaly, splenomegaly, mass or pulsatile mass.     Tenderness: There is no abdominal tenderness.  Musculoskeletal:        General: Normal range of motion.     Cervical back: Normal range of motion and neck supple.  Lymphadenopathy:     Cervical: No cervical adenopathy.   Skin:    General: Skin is warm and dry.  Neurological:     Mental Status: She is alert and oriented to person, place, and time.     Deep Tendon Reflexes: Reflexes are normal and symmetric.  Psychiatric:        Behavior: Behavior normal.        Thought Content: Thought content normal.        Judgment: Judgment normal.     BP 132/82   Pulse 71   Temp 97.9 F (36.6 C) (Temporal)   Ht 5' 7 (1.702 m)   Wt  257 lb (116.6 kg)   BMI 40.25 kg/m         Assessment & Plan:   Toni Francis comes in today with chief complaint of No chief complaint on file.   Diagnosis and orders addressed:  1. Primary hypertension Low sodium diet - triamterene -hydrochlorothiazide  (MAXZIDE -25) 37.5-25 MG tablet; Take 1 tablet by mouth daily. For blood pressure and fluid  Dispense: 90 tablet; Refill: 1 - CBC with Differential/Platelet - CMP14+EGFR - Lipid panel  2. Postoperative hypothyroidism Labs oending - levothyroxine  (SYNTHROID ) 75 MCG tablet; Take 1 tablet (75 mcg total) by mouth daily.  Dispense: 90 tablet; Refill: 1 - Thyroid  Panel With TSH  3. Gastroesophageal reflux disease without esophagitis Avoid spicy foods Do not eat 2 hours prior to bedtime - omeprazole  (PRILOSEC) 20 MG capsule; Take 1 capsule (20 mg total) by mouth daily.  Dispense: 90 capsule; Refill: 1  4. Seasonal allergic rhinitis due to pollen Continue OTC meds  5. Morbid obesity (HCC) Discussed diet and exercise for person with BMI >25 Will recheck weight in 3-6 months  6. Hx uti Results pending  7. Chronic back pain with sciatica Toradol  and prednisone  Back stretches  Labs pending Health Maintenance reviewed Diet and exercise encouraged  Follow up plan: 6 months   [name], FNP

## 2023-12-01 NOTE — Addendum Note (Signed)
 Addended by: GLADIS MUSTARD on: 12/01/2023 08:52 AM   Modules accepted: Orders, Level of Service

## 2023-12-01 NOTE — Patient Instructions (Signed)

## 2023-12-02 ENCOUNTER — Ambulatory Visit: Payer: Self-pay | Admitting: Nurse Practitioner

## 2023-12-02 ENCOUNTER — Other Ambulatory Visit: Payer: Self-pay

## 2023-12-02 DIAGNOSIS — E890 Postprocedural hypothyroidism: Secondary | ICD-10-CM

## 2023-12-02 LAB — LIPID PANEL
Cholesterol, Total: 196 mg/dL (ref 100–199)
HDL: 54 mg/dL (ref >39)
LDL CALC COMMENT:: 3.6 ratio (ref 0.0–4.4)
LDL Chol Calc (NIH): 109 mg/dL — AB (ref 0–99)
Triglycerides: 190 mg/dL — AB (ref 0–149)
VLDL Cholesterol Cal: 33 mg/dL (ref 5–40)

## 2023-12-02 LAB — VITAMIN D 25 HYDROXY (VIT D DEFICIENCY, FRACTURES): Vit D, 25-Hydroxy: 53.3 ng/mL (ref 30.0–100.0)

## 2023-12-02 LAB — CMP14+EGFR
ALT: 23 IU/L (ref 0–32)
AST: 25 IU/L (ref 0–40)
Albumin: 4.6 g/dL (ref 3.9–4.9)
Alkaline Phosphatase: 83 IU/L (ref 44–121)
BUN/Creatinine Ratio: 19 (ref 12–28)
BUN: 20 mg/dL (ref 8–27)
Bilirubin Total: 0.5 mg/dL (ref 0.0–1.2)
CO2: 24 mmol/L (ref 20–29)
Calcium: 10 mg/dL (ref 8.7–10.3)
Chloride: 100 mmol/L (ref 96–106)
Creatinine, Ser: 1.04 mg/dL — AB (ref 0.57–1.00)
Globulin, Total: 2.1 g/dL (ref 1.5–4.5)
Glucose: 91 mg/dL (ref 70–99)
Potassium: 4.5 mmol/L (ref 3.5–5.2)
Sodium: 142 mmol/L (ref 134–144)
Total Protein: 6.7 g/dL (ref 6.0–8.5)
eGFR: 59 mL/min/1.73 — AB (ref >59)

## 2023-12-02 LAB — CBC WITH DIFFERENTIAL/PLATELET
Basophils Absolute: 0.1 x10E3/uL (ref 0.0–0.2)
Basos: 1 %
EOS (ABSOLUTE): 0.1 x10E3/uL (ref 0.0–0.4)
Eos: 1 %
Hematocrit: 47.8 % — ABNORMAL HIGH (ref 34.0–46.6)
Hemoglobin: 15 g/dL (ref 11.1–15.9)
Immature Grans (Abs): 0 x10E3/uL (ref 0.0–0.1)
Immature Granulocytes: 0 %
Lymphocytes Absolute: 1.7 x10E3/uL (ref 0.7–3.1)
Lymphs: 22 %
MCH: 28.5 pg (ref 26.6–33.0)
MCHC: 31.4 g/dL — ABNORMAL LOW (ref 31.5–35.7)
MCV: 91 fL (ref 79–97)
Monocytes Absolute: 0.7 x10E3/uL (ref 0.1–0.9)
Monocytes: 10 %
Neutrophils Absolute: 5 x10E3/uL (ref 1.4–7.0)
Neutrophils: 65 %
Platelets: 279 x10E3/uL (ref 150–450)
RBC: 5.26 x10E6/uL (ref 3.77–5.28)
RDW: 13.2 % (ref 11.7–15.4)
WBC: 7.7 x10E3/uL (ref 3.4–10.8)

## 2023-12-02 LAB — THYROID PANEL WITH TSH
Free Thyroxine Index: 2.3 (ref 1.2–4.9)
T3 Uptake Ratio: 27 (ref 24–39)
T4, Total: 8.5 ug/dL (ref 4.5–12.0)
TSH: 7.02 u[IU]/mL — AB (ref 0.450–4.500)

## 2023-12-02 MED ORDER — DOXYCYCLINE HYCLATE 100 MG PO TABS: 100.0000 mg | ORAL_TABLET | Freq: Two times a day (BID) | ORAL | 0 refills | Status: DC

## 2023-12-02 MED ORDER — LEVOTHYROXINE SODIUM 75 MCG PO TABS: 75.0000 ug | ORAL_TABLET | Freq: Every day | ORAL | 3 refills | Status: AC

## 2023-12-02 MED ORDER — LEVOTHYROXINE SODIUM 88 MCG PO TABS: 88.0000 ug | ORAL_TABLET | Freq: Every day | ORAL | 1 refills | Status: DC

## 2023-12-13 ENCOUNTER — Ambulatory Visit: Payer: Self-pay | Admitting: *Deleted

## 2023-12-13 ENCOUNTER — Encounter: Payer: Self-pay | Admitting: Nurse Practitioner

## 2023-12-13 ENCOUNTER — Ambulatory Visit (INDEPENDENT_AMBULATORY_CARE_PROVIDER_SITE_OTHER): Admitting: Nurse Practitioner

## 2023-12-13 VITALS — BP 116/78 | HR 68 | Temp 97.9°F | Ht 67.0 in | Wt 259.6 lb

## 2023-12-13 DIAGNOSIS — M5441 Lumbago with sciatica, right side: Secondary | ICD-10-CM

## 2023-12-13 DIAGNOSIS — M79604 Pain in right leg: Secondary | ICD-10-CM | POA: Diagnosis not present

## 2023-12-13 DIAGNOSIS — G8929 Other chronic pain: Secondary | ICD-10-CM | POA: Diagnosis not present

## 2023-12-13 MED ORDER — PREDNISONE 10 MG (21) PO TBPK: ORAL_TABLET | ORAL | 0 refills | Status: DC

## 2023-12-13 NOTE — Progress Notes (Signed)
 Acute Office Visit  Subjective:     Patient ID: Toni Francis, female    DOB: Aug 31, 1955, 68 y.o.   MRN: [id number]  Chief Complaint  Patient presents with   Leg Pain    Right leg pain for 2 weeks     HPI Toni Francis is a 68 year old female who presents on 12/13/2023 for acute right leg pain. She reports attending chiropractic care yesterday and has been seen weekly for chronic back issues. She was seen by her PCP on 12/01/2023 for similar symptoms and received an IM injection of Toradol  and Prednisone , which she states provided relief for only one day. She is currently prescribed Mobic , Celebrex , and Flexeril  TID PRN; however, she reports only taking Flexeril  at bedtime due to drowsiness and is not taking Celebrex , requesting that it be removed from her medication list.  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  HENT:  Negative for congestion and sore throat.   Cardiovascular:  Negative for chest pain.  Musculoskeletal:        Right leg pain 4/10 throbbing pain  Skin:  Negative for itching and rash.  Neurological:  Negative for dizziness and headaches.   Negative unless indicated in HPI    Objective:    BP  116/78   Pulse 68   Temp 97.9 F (36.6 C) (Temporal)   Ht 5' 7 (1.702 m)   Wt 259 lb 9.6 oz (117.8 kg)   SpO2 95%   BMI 40.66 kg/m  BP Readings from Last 3 Encounters:  12/13/23 116/78  12/01/23 132/82  09/12/23 136/78   Wt Readings from Last 3 Encounters:  12/13/23 259 lb 9.6 oz (117.8 kg)  12/01/23 257 lb (116.6 kg)  09/12/23 261 lb (118.4 kg)      Physical Exam Vitals and nursing note reviewed.  Constitutional:      General: She is not in acute distress.    Appearance: She is obese.  HENT:     Head: Normocephalic and atraumatic.     Nose: Nose normal.  Eyes:     General:        Left eye: No discharge.     Extraocular Movements: Extraocular movements intact.     Conjunctiva/sclera: Conjunctivae normal.     Pupils: Pupils are equal, round, and reactive to light.  Cardiovascular:     Heart sounds: Normal heart sounds.  Pulmonary:     Effort: Pulmonary effort is normal.     Breath sounds: Normal breath sounds.  Musculoskeletal:        General: Normal range of motion.     Right lower leg: No edema.     Left lower leg: No edema.  Skin:    General: Skin is warm and dry.  Neurological:     Mental Status: She is alert and oriented to person, place, and time.  Psychiatric:        Mood and Affect: Mood normal.        Behavior: Behavior normal.        Thought Content: Thought content normal.        Judgment: Judgment normal.    Pertinent labs & imaging results that were available during my care of the patient were reviewed by me and considered in my medical decision making.  No results found for any visits on 12/13/23.      Assessment & Plan:  Chronic right-sided low back pain with right-sided sciatica  Right leg pain  Other orders -     predniSONE ; Use as directed on back of pill pack  Dispense: 21 tablet; Refill: 0   Toni Francis is a 68 year old Caucasian female seen today for right leg pain, no acute distress Medrol  Dosepak 10 mg #21 dispensed, she is  instructed to follow the instructions on the box  Continue previously prescribed Mobic , Flexeril  Continue follow-up with chiropractor, stretches Heat pad 15 minutes session as tolerated the above assessment and management plan was discussed with the patient. The patient verbalized understanding of and has agreed to the management plan. Patient is aware to call the clinic if they develop any new symptoms or if symptoms persist or worsen. Patient is aware when to return to the clinic for a follow-up visit. Patient educated on when it is appropriate to go to the emergency department.  Return if symptoms worsen or fail to improve.  Toni Francis [place] Thompson, DNP [facility] [address] [place], [place] [address] [phone]  Note: This document was prepared by [employer] voice dictation technology and any errors that results from this process are unintentional.

## 2023-12-13 NOTE — Telephone Encounter (Signed)
  FYI Only or Action Required?: FYI only for provider.  Patient was last seen in primary care on 12/01/2023 by Toni Mustard, FNP.  Called Nurse Triage reporting Leg Pain.  Symptoms began today.  Interventions attempted: Nothing.  Symptoms are: gradually worsening.  Triage Disposition: See HCP Within 4 Hours (Or PCP Triage)  Patient/caregiver understands and will follow disposition?: Yes               Copied from CRM #[id number]. Topic: Clinical - Red Word Triage >> Dec 13, 2023  8:39 AM [name] wrote: Right leg is throbbing it's gotten worse since seeing her pcp Reason for Disposition  [1] SEVERE pain (e.g., excruciating, unable to do any normal activities) AND [2] not improved after 2 hours of pain medicine  Answer Assessment - Initial Assessment Questions Patient did not want to complete triage and reports this is ridiculous she can't just call in and see her Dr. When she needs to . Scheduled appt and appt shows DOD instead of intended PCP.       1. ONSET: When did the pain start?      Right leg pain on going patient did not want to continue triage 2. LOCATION: Where is the pain located?      Right leg 3. PAIN: How bad is the pain?    (Scale 1-10; or mild, moderate, severe)     Throbbing 4. WORK OR EXERCISE: Has there been any recent work or exercise that involved this part of the body?      Na  5. CAUSE: What do you think is causing the leg pain?     Na  6. OTHER SYMPTOMS: Do you have any other symptoms? (e.g., chest pain, back pain, breathing difficulty, swelling, rash, fever, numbness, weakness)     Back pain , right leg pain worsening and wants to see her DR 7. PREGNANCY: Is there any chance you are pregnant? When was your last menstrual period?     na  Protocols used: Leg Pain-A-AH

## 2023-12-13 NOTE — Telephone Encounter (Signed)
 Noted

## 2024-01-11 ENCOUNTER — Other Ambulatory Visit: Payer: Self-pay | Admitting: Nurse Practitioner

## 2024-01-11 MED ORDER — CYCLOBENZAPRINE HCL 10 MG PO TABS: 10.0000 mg | ORAL_TABLET | Freq: Three times a day (TID) | ORAL | 0 refills | Status: AC | PRN

## 2024-01-11 NOTE — Progress Notes (Signed)
 Meds ordered this encounter  Medications   cyclobenzaprine  (FLEXERIL ) 10 MG tablet    Sig: Take 1 tablet (10 mg total) by mouth 3 (three) times daily as needed for muscle spasms.    Dispense:  30 tablet    Refill:  0    Supervising Provider:   MARYANNE CHEW A [[id number]]   [name], FNP

## 2024-01-14 ENCOUNTER — Other Ambulatory Visit: Payer: Self-pay | Admitting: Nurse Practitioner

## 2024-01-14 DIAGNOSIS — J301 Allergic rhinitis due to pollen: Secondary | ICD-10-CM

## 2024-01-17 ENCOUNTER — Encounter: Payer: Self-pay | Admitting: Nurse Practitioner

## 2024-01-17 ENCOUNTER — Ambulatory Visit (INDEPENDENT_AMBULATORY_CARE_PROVIDER_SITE_OTHER): Admitting: Nurse Practitioner

## 2024-01-17 VITALS — BP 139/86 | HR 72 | Temp 97.8°F | Ht 67.0 in | Wt 260.0 lb

## 2024-01-17 DIAGNOSIS — M7071 Other bursitis of hip, right hip: Secondary | ICD-10-CM | POA: Diagnosis not present

## 2024-01-17 DIAGNOSIS — J0101 Acute recurrent maxillary sinusitis: Secondary | ICD-10-CM | POA: Diagnosis not present

## 2024-01-17 MED ORDER — PREDNISONE 20 MG PO TABS: ORAL_TABLET | ORAL | 0 refills | Status: DC

## 2024-01-17 MED ORDER — METHYLPREDNISOLONE ACETATE 80 MG/ML IJ SUSP
80.0000 mg | Freq: Once | INTRAMUSCULAR | Status: AC
  Administered 2024-01-17: 80 mg via INTRAMUSCULAR

## 2024-01-17 MED ORDER — KETOROLAC TROMETHAMINE 60 MG/2ML IM SOLN
60.0000 mg | Freq: Once | INTRAMUSCULAR | Status: AC
  Administered 2024-01-17: 60 mg via INTRAMUSCULAR

## 2024-01-17 MED ORDER — DOXYCYCLINE HYCLATE 100 MG PO TABS: 100.0000 mg | ORAL_TABLET | Freq: Two times a day (BID) | ORAL | 0 refills | Status: DC

## 2024-01-17 NOTE — Progress Notes (Signed)
 Subjective:    Patient ID: Toni Francis, female    DOB: 1955/05/20, 68 y.o.   MRN: [id number]   Chief Complaint: Sinus Problem and Leg Pain (Right leg/)   Sinus Problem This is a new problem. The current episode started in the past 7 days. The problem has been waxing and waning since onset. The fever has been present for 3 to 4 days. Her pain is at a severity of 6/10. The pain is mild. Associated symptoms include congestion, coughing and sinus pressure. Pertinent negatives include no chills. Past treatments include nothing. The treatment provided mild relief.  Leg Pain  Incident onset: 1 month at least. The pain is present in the right hip. The quality of the pain is described as aching. The pain is at a severity of 9/10. The pain is mild. The pain has been Intermittent since onset. Associated symptoms include an inability to bear weight. Pertinent negatives include no loss of motion, numbness or tingling. She reports no foreign bodies present. The symptoms are aggravated by weight bearing and movement. She has tried acetaminophen  for the symptoms. The treatment provided mild relief.     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  HENT:  Positive for congestion, sinus pressure and sinus pain.   Respiratory:  Positive for cough.   Musculoskeletal:  Positive for arthralgias (right hip).  Neurological:  Negative for tingling and numbness.       Objective:   Physical Exam HENT:     Right Ear: Tympanic membrane normal. There is impacted cerumen.      Left Ear: Tympanic membrane normal. There is impacted cerumen.     Nose: Congestion and rhinorrhea present.     Right Sinus: Maxillary sinus tenderness present.     Left Sinus: Maxillary sinus tenderness present.     Mouth/Throat:     Mouth: Mucous membranes are moist.  Cardiovascular:     Rate and Rhythm: Normal rate and regular rhythm.     Heart sounds: Normal heart sounds.  Pulmonary:     Effort: Pulmonary effort is normal.     Breath sounds: Normal breath sounds.  Musculoskeletal:     Cervical back: Normal range of motion.     Comments: FROM of right hip with pain on abduction and internal rotation  Skin:    General: Skin is warm.  Neurological:     General: No focal deficit present.     Mental Status: She is oriented to person, place, and time.  Psychiatric:        Mood and Affect: Mood normal.        Behavior: Behavior normal.     BP 139/86   Pulse 72   Temp 97.8 F (36.6 C) (Temporal)   Ht 5' 7 (1.702 m)   Wt 260 lb (117.9 kg)   SpO2 99%   BMI 40.72 kg/m        Assessment & Plan:   Toni Francis in today with chief complaint of Sinus Problem and Leg Pain (Right leg/)  1. Acute recurrent maxillary sinusitis (Primary) 1. Take meds as prescribed 2. Use a cool mist humidifier especially during the winter months and when heat has been humid. 3. Use saline nose sprays frequently 4. Saline irrigations of the nose can be very helpful if done frequently.  * 4X daily for 1 week*  * Use of a nettie pot can be helpful with this. Follow directions with this* 5. Drink plenty of fluids 6. Keep thermostat turn down low 7.For any cough or congestion- mucinex  if needed 8. For fever or aces or pains- take tylenol  or ibuprofen  appropriate for age and weight.  * for fevers greater than 101 orally you may alternate ibuprofen  and tylenol  every  3 hours.    - doxycycline  (VIBRA -TABS) 100 MG tablet; Take 1 tablet (100 mg total) by mouth 2 (two) times daily. 1 po bid   Dispense: 20 tablet; Refill: 0  2. Bursitis of other bursa of right hip Moist heat - predniSONE  (DELTASONE ) 20 MG tablet; 2 po at sametime daily for 5 days-  Dispense: 10 tablet; Refill: 0 - methylPREDNISolone  acetate (DEPO-MEDROL ) injection 80 mg - ketorolac  (TORADOL ) injection 60 mg    The above assessment and management plan was discussed with the patient. The patient verbalized understanding of and has agreed to the management plan. Patient is aware to call the clinic if symptoms persist or worsen. Patient is aware when to return to the clinic for a follow-up visit. Patient educated on when it is appropriate to go to the emergency department.   [name], FNP

## 2024-01-17 NOTE — Patient Instructions (Signed)
Bursitis  Bursitis is inflammation and irritation of a bursa, which is a small fluid-filled sac that cushions and protects the joints. These sacs are located between bones and muscles, bones and muscle attachments, or bones and skin areas that are next to bones. A bursa protects those structures from the wear and tear that results from frequent movement. If the bursa becomes irritated, it can fill with extra fluid. Bursitis is most common near joints, especially the knees, elbows, hips, and shoulders. What are the causes? This condition may be caused by: An injury like a direct hit to a joint area, such as falling on your knee or elbow. Overuse of a joint (repetitive stress). Infection. This can happen if bacteria get into a bursa through a cut or scrape near a joint. Diseases that cause joint inflammation, such as gout and rheumatoid arthritis. What increases the risk? You are more likely to develop this condition if: You have a job or hobby that involves a lot of repetitive stress on your joints. You have a condition that weakens your body's defense system (immune system), such as diabetes, cancer, or HIV. You do any of these often: Lift and reach overhead. Kneel or lean on hard surfaces. Participate in physical activities that include repetitive motion, like running or walking. What are the signs or symptoms? Common symptoms of this condition include: Pain that gets worse when you move the affected body part or use it to support your body weight. Inflammation. Stiffness. Other symptoms include: Redness. Swelling. Tenderness. Warmth. Pain that continues after rest. Fever or chills. These may occur in bursitis that is caused by infection. How is this diagnosed? This condition may be diagnosed based on: Your medical history and a physical exam. Imaging tests, such as an MRI or X-ray. Draining fluid from the bursa to test it for infection or gout. Blood tests to rule out other causes  of inflammation. How is this treated? This condition can usually be treated at home with rest, ice, applying pressure (compression), and raising the body part that is affected (elevation). This is called RICE therapy. For mild bursitis, RICE therapy may be all you need. Other treatments may include: Over-the-counter medicines to relieve pain and inflammation. Injections of anti-inflammatory medicines. These medicines may be injected into and around the area of bursitis. Draining of fluid from the bursa to relieve pain and improve movement. Antibiotic medicine if there is an infection. Using a splint, brace, elastic wrap, pads, or walking aid, such as a cane. Physical or occupational therapy if you continue to have pain or limited movement. Your physical or occupational therapist can help determine what may have caused or contributed to the bursitis. This will help avoid further episodes. Surgery to remove a damaged or infected bursa. This may be needed if other treatments have not worked. Follow these instructions at home: Medicines Take over-the-counter and prescription medicines only as told by your health care provider. If you were prescribed an antibiotic medicine, take it as told by your health care provider. Do not stop using the antibiotic even if you start to feel better. Managing pain, stiffness, and swelling     Raise (elevate) the injured area above the level of your heart while you are sitting or lying down. If directed, put ice on the affected area. To do this: Put ice in a plastic bag. Place a towel between your skin and the bag, or between your splint or brace and the bag. Leave the ice on for 20 minutes,  2-3 times a day. Remove the ice if your skin turns bright red. This is very important. If you cannot feel pain, heat, or cold, you have a greater risk of damage to the area. If directed, apply heat to the affected area as often as told by your health care provider. Use the  heat source that your health care provider recommends, such as a moist heat pack or a heating pad. Place a towel between your skin and the heat source. Leave the heat on for 20-30 minutes. Remove the heat if your skin turns bright red. This is especially important if you are unable to feel pain, heat, or cold. You may have a greater risk of getting burned. General instructions Rest the affected area as told by your health care provider. Avoid activities that make pain worse. Use splints, braces, pads, elastic wrap, or walking aids as told by your health care provider. Keep all follow-up visits. This is important. Preventing future episodes Wear knee pads if you kneel often. Wear sturdy running or walking shoes that fit well. Take breaks regularly from repetitive activity. Warm up by stretching before doing any activity that takes a lot of effort. Maintain a healthy weight or lose weight as recommended by your health care provider. If you need help doing this, ask your health care provider. Exercise regularly. Start any new physical activity gradually. Work with your physical or occupational therapist and your health care provider to help determine what caused the bursitis. Contact a health care provider if: You have a fever or chills. Your symptoms do not get better with treatment. You have pain or swelling that gets worse, or it goes away and then comes back. You have pus draining from the affected area. You have redness around the affected area. The affected area is warm to the touch. Summary Bursitis is inflammation and irritation of a bursa, which is a small fluid-filled sac that cushions and protects the joints. Rest the affected area as told by your health care provider. Avoid activities that make pain worse. This condition can usually be treated at home with rest, ice, applying pressure (compression), and raising the body part that is affected (elevation). This is called RICE  therapy. This information is not intended to replace advice given to you by your health care provider. Make sure you discuss any questions you have with your health care provider. Document Revised: 04/07/2021 Document Reviewed: 04/07/2021 Elsevier Patient Education  2024 [employer].

## 2024-01-18 ENCOUNTER — Ambulatory Visit: Admitting: Family

## 2024-01-18 DIAGNOSIS — M25551 Pain in right hip: Secondary | ICD-10-CM

## 2024-01-18 DIAGNOSIS — M7061 Trochanteric bursitis, right hip: Secondary | ICD-10-CM

## 2024-01-18 NOTE — Progress Notes (Signed)
 Office Visit Note   Patient: Toni Francis           Date of Birth: [birth year]           MRN: [id number] Visit Date: 01/18/2024              Requested by: Gladis Mustard, FNP [address] [place],  [place] [address] PCP: Gladis Mustard, FNP  Chief Complaint  Patient presents with   Right Foot - Follow-up      HPI: The patient is a 68 year old woman who presents in follow-up for base of the fifth metatarsal fracture right foot in April of this year.  She has continued to wear Tommy Copper sleeves over her foot as well as a lace up ankle brace.  She also has been taping around the forefoot attempting to buddy tape and pulled the right fifth toe snug to the fourth toe she feels this will aid in her healing  Assessment & Plan: Visit Diagnoses:  1. Trochanteric bursitis, right hip     Plan: Encouraged her to resume regular shoewear encouraged her to use supportive shoe wear order a sole orthotic.  She may discontinue the Tommy Copper sleeves as well as the ankle brace discontinue taping  Follow-Up Instructions: No follow-ups on file.   Ortho Exam  Patient is alert, oriented, no adenopathy, well-dressed, normal affect, normal respiratory effort. On examination right foot the toes are straight the foot is plantigrade there is no edema no erythema she has very minimal tenderness to deep palpation of the fifth metatarsal base.  Palpable dorsalis pedis pulse.      Imaging: No results found. No images are attached to the encounter.  Labs: Lab Results  Component Value Date   HGBA1C 5.5 12/01/2023   CRP 1.1 (H) 01/29/2020   CRP 1.5 (H) 01/28/2020   CRP 3.2 (H) 01/27/2020   REPTSTATUS 01/29/2020 FINAL 01/24/2020   CULT  01/24/2020    NO GROWTH 5 DAYS Performed at [facility], [address], [place], [place] [address]      Lab Results  Component Value Date   ALBUMIN 4.6 12/01/2023   ALBUMIN 4.1 10/25/2022   ALBUMIN 4.1 04/22/2022    Lab  Results  Component Value Date   MG 2.2 01/29/2020   MG 2.3 01/28/2020   MG 2.3 01/27/2020   Lab Results  Component Value Date   VD25OH 53.3 12/01/2023   VD25OH 34.9 10/21/2017   VD25OH 40.6 04/15/2016    No results found for: PREALBUMIN    Latest Ref Rng & Units 12/01/2023    9:02 AM 10/25/2022    9:37 AM 04/22/2022    9:12 AM  CBC EXTENDED  WBC 3.4 - 10.8 x10E3/uL 7.7  10.1  6.6   RBC 3.77 - 5.28 x10E6/uL 5.26  4.84  5.12   Hemoglobin 11.1 - 15.9 g/dL 84.9  86.3  86.0   HCT 34.0 - 46.6 % 47.8  42.1  43.8   Platelets 150 - 450 x10E3/uL 279  280  249   NEUT# 1.4 - 7.0 x10E3/uL 5.0  7.4  4.3   Lymph# 0.7 - 3.1 x10E3/uL 1.7  1.5  1.7      There is no height or weight on file to calculate BMI.  Orders:  Orders Placed This Encounter  Procedures   Large Joint Inj: R greater trochanter   No orders of the defined types were placed in this encounter.    Procedures: Large Joint Inj: R  greater trochanter on 01/18/2024 3:34 PM Medications: 5 mL lidocaine  1 %; 40 mg methylPREDNISolone  acetate 40 MG/ML     Clinical Data: No additional findings.  ROS:  All other systems negative, except as noted in the HPI. Review of Systems  Objective: Vital Signs: There were no vitals taken for this visit.  Specialty Comments:  No specialty comments available.  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   Hypothyroidism 01/12/2021   GERD (gastroesophageal reflux disease) 10/08/2020   Morbid obesity (HCC) 03/06/2020   Hypertension    History of thyroid  cancer 08/13/2019   Allergic rhinitis 10/14/2015   Past Medical History:  Diagnosis Date   Abdominal hernia    Arthritis    GERD (gastroesophageal reflux disease)    Hypertension    Pinched nerve    L4-5,back    Family History  Problem Relation Age  of Onset   Colon cancer Maternal Uncle 50   Rectal cancer Neg Hx    Stomach cancer Neg Hx     Past Surgical History:  Procedure Laterality Date   CHOLECYSTECTOMY     THYROIDECTOMY Right 08/13/2019   Procedure: RIGHT THYROID  LOBECTOMY;  Surgeon: Ethyl Lonni BRAVO, MD;  Location: [facility] OR;  Service: ENT;  Laterality: Right;   Social History   Occupational History    Employer: [employer]   Occupation: Retired  Tobacco Use   Smoking status: Never   Smokeless tobacco: Never  Vaping Use   Vaping status: Never Used  Substance and Sexual Activity   Alcohol use: No   Drug use: No   Sexual activity: Not Currently

## 2024-01-24 ENCOUNTER — Encounter: Payer: Self-pay | Admitting: Family

## 2024-01-24 MED ORDER — LIDOCAINE HCL 1 % IJ SOLN
5.0000 mL | INTRAMUSCULAR | Status: AC | PRN
  Administered 2024-01-18: 5 mL

## 2024-01-24 MED ORDER — METHYLPREDNISOLONE ACETATE 40 MG/ML IJ SUSP
40.0000 mg | INTRAMUSCULAR | Status: AC | PRN
  Administered 2024-01-18: 40 mg via INTRA_ARTICULAR

## 2024-02-23 ENCOUNTER — Ambulatory Visit (INDEPENDENT_AMBULATORY_CARE_PROVIDER_SITE_OTHER): Admitting: Nurse Practitioner

## 2024-02-23 ENCOUNTER — Encounter: Payer: Self-pay | Admitting: Nurse Practitioner

## 2024-02-23 ENCOUNTER — Ambulatory Visit: Payer: Self-pay

## 2024-02-23 VITALS — BP 124/80 | HR 86 | Temp 97.9°F | Ht 67.0 in | Wt 259.0 lb

## 2024-02-23 DIAGNOSIS — M5441 Lumbago with sciatica, right side: Secondary | ICD-10-CM | POA: Diagnosis not present

## 2024-02-23 MED ORDER — METHYLPREDNISOLONE ACETATE 80 MG/ML IJ SUSP
80.0000 mg | Freq: Once | INTRAMUSCULAR | Status: AC
  Administered 2024-02-23: 80 mg via INTRAMUSCULAR

## 2024-02-23 MED ORDER — KETOROLAC TROMETHAMINE 60 MG/2ML IM SOLN
60.0000 mg | Freq: Once | INTRAMUSCULAR | Status: AC
  Administered 2024-02-23: 60 mg via INTRAMUSCULAR

## 2024-02-23 MED ORDER — PREDNISONE 20 MG PO TABS: ORAL_TABLET | ORAL | 0 refills | Status: DC

## 2024-02-23 NOTE — Patient Instructions (Signed)
 Back Exercises These exercises help to make your trunk and back strong. They also help to keep the lower back flexible. Doing these exercises can help to prevent or lessen pain in your lower back. If you have back pain, try to do these exercises 2-3 times each day or as told by your doctor. As you get better, do the exercises once each day. Repeat the exercises more often as told by your doctor. To stop back pain from coming back, do the exercises once each day, or as told by your doctor. Do exercises exactly as told by your doctor. Stop right away if you feel sudden pain or your pain gets worse. Exercises Single knee to chest Do these steps 3-5 times in a row for each leg: Lie on your back on a firm bed or the floor with your legs stretched out. Bring one knee to your chest. Grab your knee or thigh with both hands and hold it in place. Pull on your knee until you feel a gentle stretch in your lower back or butt. Keep doing the stretch for 10-30 seconds. Slowly let go of your leg and straighten it. Pelvic tilt Do these steps 5-10 times in a row: Lie on your back on a firm bed or the floor with your legs stretched out. Bend your knees so they point up to the ceiling. Your feet should be flat on the floor. Tighten your lower belly (abdomen) muscles to press your lower back against the floor. This will make your tailbone point up to the ceiling instead of pointing down to your feet or the floor. Stay in this position for 5-10 seconds while you gently tighten your muscles and breathe evenly. Cat-cow Do these steps until your lower back bends more easily: Get on your hands and knees on a firm bed or the floor. Keep your hands under your shoulders, and keep your knees under your hips. You may put padding under your knees. Let your head hang down toward your chest. Tighten (contract) the muscles in your belly. Point your tailbone toward the floor so your lower back becomes rounded like the back of a  cat. Stay in this position for 5 seconds. Slowly lift your head. Let the muscles of your belly relax. Point your tailbone up toward the ceiling so your back forms a sagging arch like the back of a cow. Stay in this position for 5 seconds.  Press-ups Do these steps 5-10 times in a row: Lie on your belly (face-down) on a firm bed or the floor. Place your hands near your head, about shoulder-width apart. While you keep your back relaxed and keep your hips on the floor, slowly straighten your arms to raise the top half of your body and lift your shoulders. Do not use your back muscles. You may change where you place your hands to make yourself more comfortable. Stay in this position for 5 seconds. Keep your back relaxed. Slowly return to lying flat on the floor.  Bridges Do these steps 10 times in a row: Lie on your back on a firm bed or the floor. Bend your knees so they point up to the ceiling. Your feet should be flat on the floor. Your arms should be flat at your sides, next to your body. Tighten your butt muscles and lift your butt off the floor until your waist is almost as high as your knees. If you do not feel the muscles working in your butt and the back of  your thighs, slide your feet 1-2 inches (2.5-5 cm) farther away from your butt. Stay in this position for 3-5 seconds. Slowly lower your butt to the floor, and let your butt muscles relax. If this exercise is too easy, try doing it with your arms crossed over your chest. Belly crunches Do these steps 5-10 times in a row: Lie on your back on a firm bed or the floor with your legs stretched out. Bend your knees so they point up to the ceiling. Your feet should be flat on the floor. Cross your arms over your chest. Tip your chin a little bit toward your chest, but do not bend your neck. Tighten your belly muscles and slowly raise your chest just enough to lift your shoulder blades a tiny bit off the floor. Avoid raising your body  higher than that because it can put too much stress on your lower back. Slowly lower your chest and your head to the floor. Back lifts Do these steps 5-10 times in a row: Lie on your belly (face-down) with your arms at your sides, and rest your forehead on the floor. Tighten the muscles in your legs and your butt. Slowly lift your chest off the floor while you keep your hips on the floor. Keep the back of your head in line with the curve in your back. Look at the floor while you do this. Stay in this position for 3-5 seconds. Slowly lower your chest and your face to the floor. Contact a doctor if: Your back pain gets a lot worse when you do an exercise. Your back pain does not get better within 2 hours after you exercise. If you have any of these problems, stop doing the exercises. Do not do them again unless your doctor says it is okay. Get help right away if: You have sudden, very bad back pain. If this happens, stop doing the exercises. Do not do them again unless your doctor says it is okay. This information is not intended to replace advice given to you by your health care provider. Make sure you discuss any questions you have with your health care provider. Document Revised: 06/25/2020 Document Reviewed: 06/25/2020 Elsevier Patient Education  2024 [employer].

## 2024-02-23 NOTE — Addendum Note (Signed)
 Addended by: GLADIS MUSTARD on: 02/23/2024 04:10 PM   Modules accepted: Level of Service

## 2024-02-23 NOTE — Progress Notes (Signed)
   Subjective:    Patient ID: Toni Francis, female    DOB: [birth year], 68 y.o.   MRN: [id number]   Chief Complaint: Back Pain   Back Pain This is a chronic problem. The problem occurs intermittently. The problem has been waxing and waning since onset. The pain is present in the lumbar spine. The quality of the pain is described as aching, burning and shooting. The pain radiates to the right thigh. The pain is at a severity of 7/10. The pain is moderate. The symptoms are aggravated by twisting and standing. Stiffness is present All day. Pertinent negatives include no paresthesias, tingling or weakness. She has tried muscle relaxant for the symptoms. The treatment provided mild relief.    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  Neurological:  Negative for tingling, weakness and paresthesias.       Objective:   Physical Exam Constitutional:      Appearance: Normal appearance. She is obese.  Cardiovascular:     Rate and Rhythm: Normal rate and regular rhythm.     Heart sounds: Normal heart sounds.  Pulmonary:     Breath sounds: Normal breath sounds.  Musculoskeletal:     Comments: FROM of lumbar spine with pain on rotation to left (+) SLR on right at 90 degrees Motor strength ans sensation  Skin:    General: Skin is warm.  Neurological:     General: No focal deficit present.     Mental Status: She is alert and oriented to person, place, and time.  Psychiatric:        Mood and Affect: Mood normal.        Behavior: Behavior normal.    BP  124/80   Pulse 86   Temp 97.9 F (36.6 C) (Temporal)   Ht 5' 7 (1.702 m)   Wt 259 lb (117.5 kg)   SpO2 95%   BMI 40.57 kg/m         Assessment & Plan:   Toni Francis in today with chief complaint of Back Pain   1. Acute right-sided low back pain with right-sided sciatica (Primary) Moist heat Back stretches - methylPREDNISolone  acetate (DEPO-MEDROL ) injection 80 mg - ketorolac  (TORADOL ) injection 60 mg  Meds ordered this encounter  Medications   methylPREDNISolone  acetate (DEPO-MEDROL ) injection 80 mg   ketorolac  (TORADOL ) injection 60 mg   predniSONE  (DELTASONE ) 20 MG tablet    Sig: 2 po at sametime daily for 5 days-    Dispense:  10 tablet    Refill:  0    Supervising Provider:   MARYANNE CHEW A [[id number]]     The above assessment and management plan was discussed with the patient. The patient verbalized understanding of and has agreed to the management plan. Patient is aware to call the clinic if symptoms persist or worsen. Patient is aware when to return to the clinic for a follow-up visit. Patient educated on when it is appropriate to go to the emergency department.   [name], FNP

## 2024-02-23 NOTE — Telephone Encounter (Signed)
 FYI Only or Action Required?: FYI only for provider: appointment scheduled on 02/23/2024 at 4 PM.  Patient was last seen in primary care on 01/17/2024 by Gladis Mustard, FNP.  Called Nurse Triage reporting Back Pain.  Symptoms began about a month ago.  Interventions attempted: OTC medications: Tylenol  and Rest, hydration, or home remedies.  Symptoms are: gradually worsening.  Triage Disposition: See HCP Within 4 Hours (Or PCP Triage)  Patient/caregiver understands and will follow disposition?: Yes  Copied from CRM #[id number]. Topic: Clinical - Red Word Triage >> Feb 23, 2024 11:56 AM Willma SAUNDERS wrote: [facility] that prompted transfer to Nurse Triage: Patient is having pain in her back and right leg. Has been ongoing for about month but is much worse today. Reason for Disposition  [1] Pain radiates into the thigh or further down the leg AND [2] both legs  Answer Assessment - Initial Assessment Questions Hx of a bulging disc. Patient reports pain started about month ago. Pain does radiate into her leg. Scheduled to see PCP today at 4:00 PM  1. ONSET: When did the pain begin? (e.g., minutes, hours, days)     Started a month ago 2. LOCATION: Where does it hurt? (upper, mid or lower back)     Lower back 3. SEVERITY: How bad is the pain?  (e.g., Scale 1-10; mild, moderate, or severe)     6 out of 10 4. PATTERN: Is the pain constant? (e.g., yes, no; constant, intermittent)      intermittent 5. RADIATION: Does the pain shoot into your legs or somewhere else?     Radiates into right leg 6. CAUSE:  What do you think is causing the back pain?      unsure 7. BACK OVERUSE:  Any recent lifting of heavy objects, strenuous work or exercise?     no 8. MEDICINES: What have you taken so far for the pain? (e.g., nothing, acetaminophen , NSAIDS)     Tylenol , Meloxicam  9. NEUROLOGIC SYMPTOMS: Do you have any weakness, numbness, or problems with bowel/bladder control?      no 10. OTHER SYMPTOMS: Do you have any other symptoms? (e.g., fever, abdomen pain, burning with urination, blood in urine)       Leg pain  Protocols used: Back Pain-A-AH

## 2024-02-23 NOTE — Telephone Encounter (Signed)
Noted  -LS

## 2024-02-27 ENCOUNTER — Encounter: Payer: Self-pay | Admitting: Radiology

## 2024-03-08 ENCOUNTER — Other Ambulatory Visit: Payer: Self-pay | Admitting: Pain Medicine

## 2024-03-08 DIAGNOSIS — M5416 Radiculopathy, lumbar region: Secondary | ICD-10-CM

## 2024-03-09 ENCOUNTER — Encounter: Payer: Self-pay | Admitting: Pain Medicine

## 2024-03-13 ENCOUNTER — Inpatient Hospital Stay
Admission: RE | Admit: 2024-03-13 | Discharge: 2024-03-13 | Disposition: A | Source: Ambulatory Visit | Attending: Pain Medicine | Admitting: Pain Medicine

## 2024-03-13 DIAGNOSIS — M5416 Radiculopathy, lumbar region: Secondary | ICD-10-CM

## 2024-04-06 ENCOUNTER — Encounter: Payer: Self-pay | Admitting: Nurse Practitioner

## 2024-04-06 ENCOUNTER — Ambulatory Visit: Payer: Self-pay | Admitting: Nurse Practitioner

## 2024-04-06 ENCOUNTER — Ambulatory Visit: Payer: Self-pay

## 2024-04-06 ENCOUNTER — Ambulatory Visit: Admitting: Nurse Practitioner

## 2024-04-06 ENCOUNTER — Ambulatory Visit

## 2024-04-06 VITALS — BP 128/82 | HR 64 | Temp 97.6°F | Ht 67.0 in | Wt 258.0 lb

## 2024-04-06 DIAGNOSIS — M79671 Pain in right foot: Secondary | ICD-10-CM

## 2024-04-06 DIAGNOSIS — R319 Hematuria, unspecified: Secondary | ICD-10-CM

## 2024-04-06 DIAGNOSIS — M5431 Sciatica, right side: Secondary | ICD-10-CM

## 2024-04-06 LAB — URINALYSIS, ROUTINE W REFLEX MICROSCOPIC
Bilirubin, UA: NEGATIVE
Glucose, UA: NEGATIVE
Ketones, UA: NEGATIVE
Leukocytes,UA: NEGATIVE
Nitrite, UA: NEGATIVE
Protein,UA: NEGATIVE
Specific Gravity, UA: 1.015 (ref 1.005–1.030)
Urobilinogen, Ur: 0.2 mg/dL (ref 0.2–1.0)
pH, UA: 5.5 (ref 5.0–7.5)

## 2024-04-06 LAB — MICROSCOPIC EXAMINATION
Bacteria, UA: NONE SEEN
RBC, Urine: NONE SEEN /HPF (ref 0–2)
Renal Epithel, UA: NONE SEEN /HPF
Yeast, UA: NONE SEEN

## 2024-04-06 MED ORDER — METHYLPREDNISOLONE ACETATE 80 MG/ML IJ SUSP
80.0000 mg | Freq: Once | INTRAMUSCULAR | Status: AC
  Administered 2024-04-06: 80 mg via INTRAMUSCULAR

## 2024-04-06 MED ORDER — KETOROLAC TROMETHAMINE 60 MG/2ML IM SOLN
60.0000 mg | Freq: Once | INTRAMUSCULAR | Status: AC
  Administered 2024-04-06: 60 mg via INTRAMUSCULAR

## 2024-04-06 NOTE — Patient Instructions (Signed)
 Foot Pain Many things can cause foot pain. Common causes include injuries to the foot. The injuries include sprains or broken bones, or injuries that affect the nerves in the feet. Other causes of foot pain include arthritis, blisters, and bunions. To know what causes your foot pain, your health care provider will take a detailed history of your symptoms. They will also do a physical exam as well as imaging tests, such as X-ray or MRI. Follow these instructions at home: Managing pain, stiffness, and swelling  If told, put ice on the painful area. Put ice in a plastic bag. Place a towel between your skin and the bag. Leave the ice on for 20 minutes, 2-3 times a day. If your skin turns bright red, remove the ice right away to prevent skin damage. The risk of damage is higher if you cannot feel pain, heat, or cold. Activity Do not stand or walk for long periods. Do stretches to relieve foot pain and stiffness as told by your provider. Do not lift anything that is heavier than 10 lb (4.5 kg), or the limit that you are told, until your provider says that it is safe. Lifting a lot of weight can put added pressure on your feet. Return to your normal activities as told by your provider. Ask your provider what activities are safe for you. Lifestyle Wear comfortable, supportive shoes that fit you well. Do not wear high heels. Keep your feet clean and dry. General instructions Take over-the-counter and prescription medicines only as told by your provider. Rub your foot gently. Pay attention to any changes in your symptoms. Let your provider know if symptoms become worse. Keep all follow-up visits. Your provider will want to monitor your progress. Contact a health care provider if: Your pain does not get better after a few days of treatment at home. Your pain gets worse. You cannot stand on your foot. Your foot or toes are swollen. Your foot is numb or tingling. Get help right away if: Your foot  or toes turn white or blue. You have warmth and redness along your foot. This information is not intended to replace advice given to you by your health care provider. Make sure you discuss any questions you have with your health care provider. Document Revised: 05/06/2022 Document Reviewed: 01/12/2022 Elsevier Patient Education  2024 [employer].

## 2024-04-06 NOTE — Telephone Encounter (Signed)
 FYI Only or Action Required?: FYI only for provider: appointment scheduled on this morning.  Patient was last seen in primary care on 02/23/2024 by Gladis Mustard, FNP.  Called Nurse Triage reporting Hematuria and foot pain - previously fractured  Symptoms began today.  Interventions attempted: Nothing.  Symptoms are: gradually worsening.  Triage Disposition: See Physician Within 24 Hours  Patient/caregiver understands and will follow disposition?: yes                   Copied from CRM #[id number]. Topic: Clinical - Red Word Triage >> Apr 06, 2024  8:07 AM Carlyon D wrote: Red Word that prompted transfer to Nurse Triage: Blood in urine, Right leg pain, And wants foot checked fractured it some months ago but believes it re injured as the bone is sticking back out. Reason for Disposition  Side (flank) or back pain present  Answer Assessment - Initial Assessment Questions 1. COLOR of URINE: Describe the color of the urine.  (e.g., tea-colored, pink, red, bloody) Do you have blood clots in your urine? (e.g., none, pea, grape, small coin)     Blood in urine 2. ONSET: When did the bleeding start?      today 3. EPISODES: How many times has there been blood in the urine? or How many times today?     unsure 4. PAIN with URINATION: Is there any pain with passing your urine? If Yes, ask: How bad is the pain?  (Scale 1-10; or mild, moderate, severe)     no 5. FEVER: Do you have a fever? If Yes, ask: What is your temperature, how was it measured, and when did it start?     no 6. ASSOCIATED SYMPTOMS: Are you passing urine more frequently than usual?     no 7. OTHER SYMPTOMS: Do you have any other symptoms? (e.g., back/flank pain, abdomen pain, vomiting)     Back pain  Protocols used: Urine - Blood In-A-AH

## 2024-04-06 NOTE — Telephone Encounter (Signed)
 Noted. Scheduled to be seen today by PCP.

## 2024-04-06 NOTE — Progress Notes (Signed)
 Subjective:    Patient ID: Toni Francis, female    DOB: [birth year], 68 y.o.   MRN: [id number]   Chief Complaint: Hematuria and Foot Pain (Right foot)   Hematuria Pertinent negatives include no abdominal pain.  Foot Pain Pertinent negatives include no abdominal pain, chest pain, diaphoresis, headaches, rash or weakness.    Patient in today with 2 complaints: - right foot pain- patient injured her foot months ago. Had to wear fracture shoe. Saw ortho. She says foot is still hurting her - dysuria- started this morning- had blood in urine. Is clear now. - sciatica- started several days ago. Pain radiating down back of right leg- pain is intermittent- rate spain 8/10 today. Worse when walking or standing. 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  Eyes:  Negative for pain.  Respiratory:  Negative for shortness of breath.   Cardiovascular:  Negative for chest pain, palpitations and leg swelling.  Gastrointestinal:  Negative for abdominal pain.  Endocrine: Negative for polydipsia.  Genitourinary:  Positive for hematuria.  Skin:  Negative for rash.  Neurological:  Negative for dizziness, weakness and headaches.  Hematological:  Does not bruise/bleed easily.  All other systems reviewed and are negative.      Objective:   Physical Exam Constitutional:      Appearance: Normal appearance.  Cardiovascular:     Rate and Rhythm: Normal rate and regular rhythm.     Heart sounds: Normal heart sounds.  Pulmonary:     Breath sounds: Normal breath sounds.   Musculoskeletal:     Comments: Right foot has lateral edema- mild tenderness to palpation.  Skin:    General: Skin is warm.  Neurological:     General: No focal deficit present.     Mental Status: She is alert and oriented to person, place, and time.  Psychiatric:        Mood and Affect: Mood normal.        Behavior: Behavior normal.    BP 128/82   Pulse 64   Temp 97.6 F (36.4 C) (Temporal)   Ht 5' 7 (1.702 m)   Wt 258 lb (117 kg)   SpO2 94%   BMI 40.41 kg/m   Right foot xray- displaced fracture still visible- may be slightly worse-Preliminary reading by Ronal Lunger, FNP  [facility]       Assessment & Plan:   [name] in today with chief complaint of Hematuria and Foot Pain (Right foot)   1. Hematuria, unspecified type (Primary) Take medication as prescribe Cotton underwear Take shower not bath Cranberry juice, yogurt Force fluids AZO over the counter X2 days Culture pending RTO prn  - Urinalysis, Routine w reflex microscopic - Urine Culture  2. Right foot pain Waiting on radiology report - DG Foot Complete Right  Orders Placed This Encounter  Procedures   Urine Culture   DG Foot Complete Right    Reason for Exam (SYMPTOM  OR DIAGNOSIS REQUIRED):   foot pain    Preferred imaging location?:   Internal   Urinalysis,  Routine w reflex microscopic   Ambulatory referral to Orthopedic Surgery    Referral Priority:   Routine    Referral Type:   Surgical    Referral Reason:   Specialty Services Required    Requested Specialty:   Orthopedic Surgery    Number of Visits Requested:   1   3. Sciatica right Moist heat Rest Prednisone  80mg  IM now Toradol  60mg  IM now  The above assessment and management plan was discussed with the patient. The patient verbalized understanding of and has agreed to the management plan. Patient is aware to call the clinic if symptoms persist or worsen. Patient is aware when to return to the clinic for a follow-up visit. Patient  educated on when it is appropriate to go to the emergency department.   [name], FNP

## 2024-04-08 LAB — URINE CULTURE

## 2024-04-09 ENCOUNTER — Other Ambulatory Visit: Payer: Self-pay

## 2024-04-09 ENCOUNTER — Encounter: Payer: Self-pay | Admitting: Physical Therapy

## 2024-04-09 ENCOUNTER — Ambulatory Visit: Admitting: Physical Therapy

## 2024-04-09 DIAGNOSIS — R293 Abnormal posture: Secondary | ICD-10-CM

## 2024-04-09 DIAGNOSIS — M62830 Muscle spasm of back: Secondary | ICD-10-CM

## 2024-04-09 DIAGNOSIS — M5459 Other low back pain: Secondary | ICD-10-CM | POA: Insufficient documentation

## 2024-04-09 NOTE — Therapy (Signed)
 OUTPATIENT PHYSICAL THERAPY THORACOLUMBAR EVALUATION   Patient Name: Toni Francis MRN: [id number] DOB:1956/02/23, 68 y.o., female Today's Date: 04/09/2024  END OF SESSION:  PT End of Session - 04/09/24 0945     Visit Number 1    Number of Visits 12    Date for Recertification  05/21/24    PT Start Time 0919    PT Stop Time 1013    PT Time Calculation (min) 54 min    Activity Tolerance Patient tolerated treatment well    Behavior During Therapy [facility] for tasks assessed/performed          Past Medical History:  Diagnosis Date   Abdominal hernia    Arthritis    GERD (gastroesophageal reflux disease)    Hypertension    Pinched nerve    L4-5,back   Past Surgical History:  Procedure Laterality Date   CHOLECYSTECTOMY     THYROIDECTOMY Right 08/13/2019   Procedure: RIGHT THYROID  LOBECTOMY;  Surgeon: Ethyl Lonni BRAVO, MD;  Location: [facility] OR;  Service: ENT;  Laterality: Right;   Patient Active Problem List   Diagnosis Date Noted   Right leg pain 12/13/2023   Chronic right-sided low back pain with right-sided sciatica 12/13/2023   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  Rationale for Evaluation and Treatment: Rehabilitation  THERAPY DIAG:  Other low back pain  Abnormal posture  Muscle spasm of back  ONSET DATE: Ongoing for LBP, 9/25 for right LE.  SUBJECTIVE:                                                                                                                                                                                           SUBJECTIVE STATEMENT: The patient presents to the clinic with c/o chronic LBP that has been ongoing for several years and right LE pain  since September of this year after lifting her elderly Father in his wheelchair.  Her pain-level is rated at a 5-6/10 today and increases with prolonged walking.  Rest/lying doen decreases pain.    PERTINENT HISTORY:  She has seen a Chiropractor for years but will put these treatments on hold while receiving PT.  H/o right knee pain.    PAIN:  Are you having pain? Yes: NPRS scale: 5-6/10. Pain location: LB, right LE into posterior/lateral thigh that does not go below level of knee.  Pain description: Ache, throb, shooting.   Aggravating factors: As above.   Relieving factors: As above.    PRECAUTIONS: None  RED FLAGS: None   WEIGHT BEARING RESTRICTIONS: No  FALLS:  Has patient fallen in last 6 months? No  LIVING ENVIRONMENT: Lives with: lives with their family Lives in: House/apartment Has following equipment at home: None  OCCUPATION: Retired.    PLOF: Independent  PATIENT GOALS: Avoid surgery.    OBJECTIVE:   DIAGNOSTIC FINDINGS:  03/13/24:  IMPRESSION: 1. New left subarticular disc protrusion at L3-L4 resulting in lateral recess narrowing with severe impingement upon the traversing left L4 nerve root. 2. Severe bilateral facet arthrosis at L4-5 with bilateral facet effusions, increased from prior. 3. Additional degenerative changes as above. 4. Right L5-S1 facet synovial cyst measuring 9 mm. No contribution to spinal canal stenosis.  PATIENT SURVEYS:  ODI:  23/50.     POSTURE: rounded shoulders, forward head, and flexed trunk .  Left pelvis higher than right.  Right knee genu valgum.    PALPATION: Tender to palpation with overpressure at L3-S1 and especially tender over the right SIJ.    LUMBAR ROM:   Full active lumbar flexion and extension to 15 degrees.    LOWER EXTREMITY ROM:     WNL.    LOWER EXTREMITY MMT:    WNL.    LUMBAR SPECIAL TESTS:  Equal leg lengths. Some pain reproduction with a right SLR.  No pain with right FABER test.   Diminished Achilles reflexes.    GAIT: Limpage gait over right LE.    TREATMENT DATE: 04/09/24:  HMP and IFC at 80-150 Hz on 40% scan x 20 minutes f/b pateint in left SDLY position with folded pillow between knees for comfort while receiving STW/M x 8 minutes to her right SIJ region.   Normal modality resposne following removal of modality.  Patient states he felt much better after treatment.                                                                                                                             PATIENT EDUCATION:  Education details:  Person educated:  International aid/development worker:  Education comprehension:   HOME EXERCISE PROGRAM:   ASSESSMENT:  CLINICAL IMPRESSION: The patient presents to OPPT with c/o chronic low back pain and right LE pain that occurred after lifting her Father from his wheelchair in September of this year.  She is tender to palpation with overpressure at L3-S1 and especially tender over the right SIJ.  Her LE strength is normal.  Her posture is remarkable for rounded shoulders, forward head, and a flexed trunk .  Her left pelvis is higher than her right and her right knee is remarkable genu valgum.  She experienced some pain reproduction with a right SLR test.  Her ODI score is 23/50.  Patient will benefit from skilled physical therapy intervention to address pain and deficits.   OBJECTIVE IMPAIRMENTS: Abnormal gait, decreased  activity tolerance, difficulty walking, decreased ROM, increased muscle spasms, postural dysfunction, and pain.   ACTIVITY LIMITATIONS: carrying, lifting, bending, standing, and locomotion level  PARTICIPATION LIMITATIONS: meal prep, cleaning, and laundry  PERSONAL FACTORS: Time since onset of injury/illness/exacerbation are also affecting patient's functional outcome.   REHAB POTENTIAL: Good  CLINICAL DECISION MAKING: Evolving/moderate complexity  EVALUATION COMPLEXITY: Low   GOALS:  LONG TERM GOALS: Target date:  05/21/24  Ind with a HEP. Goal status: INITIAL  2.  Eliminate right LE pain/symptoms. Goal status: INITIAL  3.  Perform ADL's with pain not > 3/10.  Goal status: INITIAL  4.  Walk a community distance with pain not > 3/10.  Goal status: INITIAL  5.  Improve ODI score by at least 8 points.  Goal status: INITIAL  PLAN:  PT FREQUENCY: 2x/week  PT DURATION: 6 weeks  PLANNED INTERVENTIONS: 97110-Therapeutic exercises, 97530- Therapeutic activity, [id number]- Neuromuscular re-education, 97535- Self Care, 02859- Manual therapy, G0283- Electrical stimulation (unattended), 97035- Ultrasound, 02987- Traction (mechanical), Patient/Family education, and Moist heat.  PLAN FOR NEXT SESSION: Combo e'stim/US , STW/M.  Core exercise progression.  Body mechanics training and spinal protection techniques.     Jisel Fleet, PT 04/09/2024, 11:13 AM

## 2024-04-13 ENCOUNTER — Ambulatory Visit: Admitting: *Deleted

## 2024-04-13 ENCOUNTER — Encounter: Payer: Self-pay | Admitting: *Deleted

## 2024-04-13 DIAGNOSIS — M5459 Other low back pain: Secondary | ICD-10-CM | POA: Diagnosis not present

## 2024-04-13 DIAGNOSIS — M62830 Muscle spasm of back: Secondary | ICD-10-CM

## 2024-04-13 DIAGNOSIS — R293 Abnormal posture: Secondary | ICD-10-CM

## 2024-04-13 NOTE — Therapy (Signed)
 " OUTPATIENT PHYSICAL THERAPY THORACOLUMBAR EVALUATION   Patient Name: Toni Francis MRN: [id number] DOB:28-Jun-1955, 68 y.o., female Today's Date: 04/13/2024  END OF SESSION:  PT End of Session - 04/13/24 0857     Visit Number 2    Number of Visits 12    Date for Recertification  05/21/24    PT Start Time 0845    PT Stop Time 0935    PT Time Calculation (min) 50 min          Past Medical History:  Diagnosis Date   Abdominal hernia    Arthritis    GERD (gastroesophageal reflux disease)    Hypertension    Pinched nerve    L4-5,back   Past Surgical History:  Procedure Laterality Date   CHOLECYSTECTOMY     THYROIDECTOMY Right 08/13/2019   Procedure: RIGHT THYROID  LOBECTOMY;  Surgeon: Ethyl Lonni BRAVO, MD;  Location: [facility] OR;  Service: ENT;  Laterality: Right;   Patient Active Problem List   Diagnosis Date Noted   Right leg pain 12/13/2023   Chronic right-sided low back pain with right-sided sciatica 12/13/2023   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  Rationale for Evaluation and Treatment: Rehabilitation  THERAPY DIAG:  Other low back pain  Abnormal posture  Muscle spasm of back  ONSET DATE: Ongoing for LBP, 9/25 for right LE.  SUBJECTIVE:                                                                                                                                                                                           SUBJECTIVE STATEMENT: The patient presents to the clinic with c/o chronic LBP that has been ongoing for several years and right LE pain since September of this year after lifting her elderly Father in his wheelchair.  Her pain-level is rated at a 5-6/10  today and increases with prolonged walking.  Rest/lying doen decreases pain.    PERTINENT HISTORY:  She has seen a Chiropractor for years but will put these treatments on hold while receiving PT.  H/o right knee pain.    PAIN:  Are you having pain? Yes: NPRS scale: 5-6/10. Pain location: LB, right LE into posterior/lateral thigh that does not go below level of knee.   Pain description: Ache, throb, shooting.   Aggravating factors: As above.   Relieving factors: As above.  PRECAUTIONS: None  RED FLAGS: None   WEIGHT BEARING RESTRICTIONS: No  FALLS:  Has patient fallen in last 6 months? No  LIVING ENVIRONMENT: Lives with: lives with their family Lives in: House/apartment Has following equipment at home: None  OCCUPATION: Retired.    PLOF: Independent  PATIENT GOALS: Avoid surgery.    OBJECTIVE:   DIAGNOSTIC FINDINGS:  03/13/24:  IMPRESSION: 1. New left subarticular disc protrusion at L3-L4 resulting in lateral recess narrowing with severe impingement upon the traversing left L4 nerve root. 2. Severe bilateral facet arthrosis at L4-5 with bilateral facet effusions, increased from prior. 3. Additional degenerative changes as above. 4. Right L5-S1 facet synovial cyst measuring 9 mm. No contribution to spinal canal stenosis.  PATIENT SURVEYS:  ODI:  23/50.     POSTURE: rounded shoulders, forward head, and flexed trunk .  Left pelvis higher than right.  Right knee genu valgum.    PALPATION: Tender to palpation with overpressure at L3-S1 and especially tender over the right SIJ.    LUMBAR ROM:   Full active lumbar flexion and extension to 15 degrees.    LOWER EXTREMITY ROM:     WNL.    LOWER EXTREMITY MMT:    WNL.    LUMBAR SPECIAL TESTS:  Equal leg lengths. Some pain reproduction with a right SLR.  No pain with right FABER test.  Diminished Achilles reflexes.    GAIT: Limpage gait over right LE.    TREATMENT DATE: 04/09/24:   Nustep x 10 mins  L2 Pateint in left SDLY position with folded pillow between knees for comfort while receiving  STW/M x 20 minutes to her right side LB paras, SIJ , andpiriformus                                                                                                                       HMP and IFC at 80-150 Hz on 100% scan x 15 minutes to RT side LB and hip  PATIENT EDUCATION:  Education details:  Person educated:  International aid/development worker:  Education comprehension:   HOME EXERCISE PROGRAM:   ASSESSMENT:  CLINICAL IMPRESSION: The patient presents to OPPT with c/o chronic low back pain and right LE pain that occurred after lifting her Father from his wheelchair in September of this year. Pt arrived reporting decreased pain x 30 mins, but then was sore after last Rx.Rx focused on therex and Manual STW to RT LB and HIP with Pt LT side lyng and did well      OBJECTIVE IMPAIRMENTS: Abnormal gait, decreased activity tolerance, difficulty walking, decreased ROM, increased muscle spasms, postural dysfunction, and pain.   ACTIVITY LIMITATIONS: carrying, lifting, bending, standing, and locomotion level  PARTICIPATION LIMITATIONS: meal prep, cleaning, and laundry  PERSONAL FACTORS: Time since onset of injury/illness/exacerbation are also affecting patient's functional outcome.   REHAB POTENTIAL: Good  CLINICAL DECISION MAKING: Evolving/moderate complexity  EVALUATION COMPLEXITY: Low   GOALS:  LONG TERM GOALS: Target date: 05/21/24  Ind with a HEP.  Goal status: INITIAL  2.  Eliminate right LE pain/symptoms. Goal status: INITIAL  3.  Perform ADL's with pain not > 3/10.  Goal status: INITIAL  4.  Walk a community distance with pain not > 3/10.  Goal status: INITIAL  5.  Improve ODI score by at least 8 points.  Goal status: INITIAL  PLAN:  PT FREQUENCY: 2x/week  PT DURATION: 6 weeks  PLANNED INTERVENTIONS: 97110-Therapeutic exercises, 97530- Therapeutic activity, [id number]- Neuromuscular  re-education, 97535- Self Care, 02859- Manual therapy, G0283- Electrical stimulation (unattended), 97035- Ultrasound, 02987- Traction (mechanical), Patient/Family education, and Moist heat.  PLAN FOR NEXT SESSION: Combo e'stim/US , STW/M.  Core exercise progression.  Body mechanics training and spinal protection techniques.     Ailis Rigaud,CHRIS, PTA 04/13/2024, 10:07 AM  "

## 2024-04-16 ENCOUNTER — Encounter: Payer: Self-pay | Admitting: *Deleted

## 2024-04-16 ENCOUNTER — Ambulatory Visit: Admitting: *Deleted

## 2024-04-16 DIAGNOSIS — M62830 Muscle spasm of back: Secondary | ICD-10-CM

## 2024-04-16 DIAGNOSIS — M5459 Other low back pain: Secondary | ICD-10-CM | POA: Diagnosis not present

## 2024-04-16 DIAGNOSIS — R293 Abnormal posture: Secondary | ICD-10-CM

## 2024-04-16 NOTE — Therapy (Signed)
 " OUTPATIENT PHYSICAL THERAPY THORACOLUMBAR TREATMENT   Patient Name: Toni Francis MRN: [id number] DOB:[birth year], 68 y.o., female Today's Date: 04/16/2024  END OF SESSION:  PT End of Session - 04/16/24 1439     Visit Number 3    Number of Visits 12    Date for Recertification  05/21/24    PT Start Time 1430    PT Stop Time 1521    PT Time Calculation (min) 51 min          Past Medical History:  Diagnosis Date   Abdominal hernia    Arthritis    GERD (gastroesophageal reflux disease)    Hypertension    Pinched nerve    L4-5,back   Past Surgical History:  Procedure Laterality Date   CHOLECYSTECTOMY     THYROIDECTOMY Right 08/13/2019   Procedure: RIGHT THYROID  LOBECTOMY;  Surgeon: Ethyl Lonni BRAVO, MD;  Location: [facility] OR;  Service: ENT;  Laterality: Right;   Patient Active Problem List   Diagnosis Date Noted   Right leg pain 12/13/2023   Chronic right-sided low back pain with right-sided sciatica 12/13/2023   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  Rationale for Evaluation and Treatment: Rehabilitation  THERAPY DIAG:  Other low back pain  Abnormal posture  Muscle spasm of back  ONSET DATE: Ongoing for LBP, 9/25 for right LE.  SUBJECTIVE:                                                                                                                                                                                           SUBJECTIVE STATEMENT: The patient reports having decreased pain for several hours last Rx.   PERTINENT HISTORY:  She has seen a Chiropractor for years but will put these treatments on hold while receiving PT.  H/o right knee pain.    PAIN:  Are  you having pain? Yes: NPRS scale: 5-6/10. Pain location: LB, right LE into posterior/lateral thigh that does not go below level of knee.   Pain description: Ache, throb, shooting.   Aggravating factors: As above.   Relieving factors: As above.    PRECAUTIONS: None  RED FLAGS: None   WEIGHT BEARING RESTRICTIONS: No  FALLS:  Has patient fallen in last 6 months? No  LIVING ENVIRONMENT: Lives with: lives with their family Lives in: House/apartment Has following equipment at home:  None  OCCUPATION: Retired.    PLOF: Independent  PATIENT GOALS: Avoid surgery.    OBJECTIVE:   DIAGNOSTIC FINDINGS:  03/13/24:  IMPRESSION: 1. New left subarticular disc protrusion at L3-L4 resulting in lateral recess narrowing with severe impingement upon the traversing left L4 nerve root. 2. Severe bilateral facet arthrosis at L4-5 with bilateral facet effusions, increased from prior. 3. Additional degenerative changes as above. 4. Right L5-S1 facet synovial cyst measuring 9 mm. No contribution to spinal canal stenosis.  PATIENT SURVEYS:  ODI:  23/50.     POSTURE: rounded shoulders, forward head, and flexed trunk .  Left pelvis higher than right.  Right knee genu valgum.    PALPATION: Tender to palpation with overpressure at L3-S1 and especially tender over the right SIJ.    LUMBAR ROM:   Full active lumbar flexion and extension to 15 degrees.    LOWER EXTREMITY ROM:     WNL.    LOWER EXTREMITY MMT:    WNL.    LUMBAR SPECIAL TESTS:  Equal leg lengths. Some pain reproduction with a right SLR.  No pain with right FABER test.  Diminished Achilles reflexes.    GAIT: Limpage gait over right LE.    TREATMENT DATE: 04/09/24:   Nustep x 12 mins L2 Pateint in left SDLY position with folded pillow between knees for comfort while receiving  STW/M x 21 minutes to her right side LB paras, SIJ , and piriformis                                                                                                                        HMP and IFC at 80-150 Hz on 100% scan x 20 minutes to RT side LB and hip  Hooklying  PATIENT EDUCATION:  Education details:  Person educated:  International aid/development worker:  Education comprehension:   HOME EXERCISE PROGRAM:   ASSESSMENT:  CLINICAL IMPRESSION: The patient reports decreased pain for several hours last Rx.Rx focused on therex and Manual STW to RT LB and HIP with Pt LT side lyng and did well. IFC in hook lying today      OBJECTIVE IMPAIRMENTS: Abnormal gait, decreased activity tolerance, difficulty walking, decreased ROM, increased muscle spasms, postural dysfunction, and pain.   ACTIVITY LIMITATIONS: carrying, lifting, bending, standing, and locomotion level  PARTICIPATION LIMITATIONS: meal prep, cleaning, and laundry  PERSONAL FACTORS: Time since onset of injury/illness/exacerbation are also affecting patient's functional outcome.   REHAB POTENTIAL: Good  CLINICAL DECISION MAKING: Evolving/moderate complexity  EVALUATION COMPLEXITY: Low   GOALS:  LONG TERM GOALS: Target date: 05/21/24  Ind with a HEP. Goal status: INITIAL  2.  Eliminate right LE pain/symptoms. Goal status: INITIAL  3.  Perform ADL's with pain not > 3/10.  Goal status: INITIAL  4.  Walk a community distance with pain not > 3/10.  Goal status: INITIAL  5.  Improve ODI score by at least 8 points.  Goal status: INITIAL  PLAN:  PT FREQUENCY: 2x/week  PT  DURATION: 6 weeks  PLANNED INTERVENTIONS: 97110-Therapeutic exercises, 97530- Therapeutic activity, [id number]- Neuromuscular re-education, 97535- Self Care, 02859- Manual therapy, G0283- Electrical stimulation (unattended), 97035- Ultrasound, 02987- Traction (mechanical), Patient/Family education, and Moist heat.  PLAN FOR NEXT SESSION: Combo e'stim/US , STW/M.  Core exercise progression.  Body mechanics training and spinal protection techniques.     Shade Kaley,CHRIS, PTA 04/16/2024, 4:49 PM  "

## 2024-04-17 ENCOUNTER — Encounter: Payer: Self-pay | Admitting: *Deleted

## 2024-04-17 ENCOUNTER — Ambulatory Visit: Admitting: *Deleted

## 2024-04-17 DIAGNOSIS — R293 Abnormal posture: Secondary | ICD-10-CM

## 2024-04-17 DIAGNOSIS — M5459 Other low back pain: Secondary | ICD-10-CM

## 2024-04-17 DIAGNOSIS — M62830 Muscle spasm of back: Secondary | ICD-10-CM

## 2024-04-17 NOTE — Therapy (Signed)
 " OUTPATIENT PHYSICAL THERAPY THORACOLUMBAR TREATMENT   Patient Name: Toni Francis MRN: [id number] DOB:14-Apr-1956, 68 y.o., female Today's Date: 04/17/2024  END OF SESSION:  PT End of Session - 04/17/24 1439     Visit Number 4    Number of Visits 12    Date for Recertification  05/21/24    PT Start Time 1430    PT Stop Time 1520    PT Time Calculation (min) 50 min          Past Medical History:  Diagnosis Date   Abdominal hernia    Arthritis    GERD (gastroesophageal reflux disease)    Hypertension    Pinched nerve    L4-5,back   Past Surgical History:  Procedure Laterality Date   CHOLECYSTECTOMY     THYROIDECTOMY Right 08/13/2019   Procedure: RIGHT THYROID  LOBECTOMY;  Surgeon: Ethyl Lonni BRAVO, MD;  Location: [facility] OR;  Service: ENT;  Laterality: Right;   Patient Active Problem List   Diagnosis Date Noted   Right leg pain 12/13/2023   Chronic right-sided low back pain with right-sided sciatica 12/13/2023   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  Rationale for Evaluation and Treatment: Rehabilitation  THERAPY DIAG:  Other low back pain  Abnormal posture  Muscle spasm of back  ONSET DATE: Ongoing for LBP, 9/25 for right LE.  SUBJECTIVE:                                                                                                                                                                                           SUBJECTIVE STATEMENT: The patient reports having decreased pain for several hours last Rx. Doing okay today with less pain 5-6/10   PERTINENT HISTORY:  She has seen a Chiropractor for years but will put these treatments on hold while receiving PT.   H/o right knee pain.    PAIN:  Are you having pain? Yes: NPRS scale: 5-6/10. Pain location: LB, right LE into posterior/lateral thigh that does not go below level of knee.   Pain description: Ache, throb, shooting.   Aggravating factors: As above.   Relieving factors: As above.    PRECAUTIONS: None  RED FLAGS: None   WEIGHT BEARING RESTRICTIONS: No  FALLS:  Has patient fallen in last 6 months? No  LIVING ENVIRONMENT: Lives with: lives with their family Lives  in: House/apartment Has following equipment at home: None  OCCUPATION: Retired.    PLOF: Independent  PATIENT GOALS: Avoid surgery.    OBJECTIVE:   DIAGNOSTIC FINDINGS:  03/13/24:  IMPRESSION: 1. New left subarticular disc protrusion at L3-L4 resulting in lateral recess narrowing with severe impingement upon the traversing left L4 nerve root. 2. Severe bilateral facet arthrosis at L4-5 with bilateral facet effusions, increased from prior. 3. Additional degenerative changes as above. 4. Right L5-S1 facet synovial cyst measuring 9 mm. No contribution to spinal canal stenosis.  PATIENT SURVEYS:  ODI:  23/50.     POSTURE: rounded shoulders, forward head, and flexed trunk .  Left pelvis higher than right.  Right knee genu valgum.    PALPATION: Tender to palpation with overpressure at L3-S1 and especially tender over the right SIJ.    LUMBAR ROM:   Full active lumbar flexion and extension to 15 degrees.    LOWER EXTREMITY ROM:     WNL.    LOWER EXTREMITY MMT:    WNL.    LUMBAR SPECIAL TESTS:  Equal leg lengths. Some pain reproduction with a right SLR.  No pain with right FABER test.  Diminished Achilles reflexes.    GAIT: Limpage gait over right LE.    TREATMENT DATE: 04/09/24:   Nustep x 15 mins L2 Pateint in left SDLY position with folded pillow between knees for comfort while receiving  STW/M x 21 minutes to her right side LB paras, SIJ , glute  and piriformis                                                                                                                        HMP and IFC at 80-150 Hz on 100% scan x 20 minutes to RT side LB and hip  Hooklying  PATIENT EDUCATION:  Education details:  Person educated:  International aid/development worker:  Education comprehension:   HOME EXERCISE PROGRAM:   ASSESSMENT:  CLINICAL IMPRESSION: The patient reports decreased pain for several hours last Rx.Rx focused on therex and Manual STW to RT LB ,HIP and ITB with Pt LT side lyng and did well. IFC in hook lying today. Improved gait pattern when leaving      OBJECTIVE IMPAIRMENTS: Abnormal gait, decreased activity tolerance, difficulty walking, decreased ROM, increased muscle spasms, postural dysfunction, and pain.   ACTIVITY LIMITATIONS: carrying, lifting, bending, standing, and locomotion level  PARTICIPATION LIMITATIONS: meal prep, cleaning, and laundry  PERSONAL FACTORS: Time since onset of injury/illness/exacerbation are also affecting patient's functional outcome.   REHAB POTENTIAL: Good  CLINICAL DECISION MAKING: Evolving/moderate complexity  EVALUATION COMPLEXITY: Low   GOALS:  LONG TERM GOALS: Target date: 05/21/24  Ind with a HEP. Goal status: INITIAL  2.  Eliminate right LE pain/symptoms. Goal status: INITIAL  3.  Perform ADL's with pain not > 3/10.  Goal status: INITIAL  4.  Walk a community distance with pain not > 3/10.  Goal status: INITIAL  5.  Improve ODI score by at  least 8 points.  Goal status: INITIAL  PLAN:  PT FREQUENCY: 2x/week  PT DURATION: 6 weeks  PLANNED INTERVENTIONS: 97110-Therapeutic exercises, 97530- Therapeutic activity, [id number]- Neuromuscular re-education, 97535- Self Care, 02859- Manual therapy, G0283- Electrical stimulation (unattended), 97035- Ultrasound, 02987- Traction (mechanical), Patient/Family education, and Moist heat.  PLAN FOR NEXT SESSION: Combo e'stim/US , STW/M.  Core exercise progression.  Body mechanics training and  spinal protection techniques.     Santanna Whitford,CHRIS, PTA 04/17/2024, 5:43 PM  "

## 2024-04-23 ENCOUNTER — Other Ambulatory Visit: Payer: Self-pay | Admitting: Nurse Practitioner

## 2024-04-23 MED ORDER — AZITHROMYCIN 250 MG PO TABS: ORAL_TABLET | ORAL | 0 refills | Status: DC

## 2024-04-24 ENCOUNTER — Ambulatory Visit: Admitting: *Deleted

## 2024-04-24 DIAGNOSIS — M62830 Muscle spasm of back: Secondary | ICD-10-CM

## 2024-04-24 DIAGNOSIS — M5459 Other low back pain: Secondary | ICD-10-CM

## 2024-04-24 DIAGNOSIS — R293 Abnormal posture: Secondary | ICD-10-CM

## 2024-04-24 NOTE — Therapy (Signed)
 " OUTPATIENT PHYSICAL THERAPY THORACOLUMBAR TREATMENT   Patient Name: Toni Francis MRN: [id number] DOB:1955-11-29, 68 y.o., female Today's Date: 04/24/2024  END OF SESSION:  PT End of Session - 04/24/24 0805     Visit Number 5    Number of Visits 12    Date for Recertification  05/21/24    PT Start Time 0800    PT Stop Time 0900    PT Time Calculation (min) 60 min          Past Medical History:  Diagnosis Date   Abdominal hernia    Arthritis    GERD (gastroesophageal reflux disease)    Hypertension    Pinched nerve    L4-5,back   Past Surgical History:  Procedure Laterality Date   CHOLECYSTECTOMY     THYROIDECTOMY Right 08/13/2019   Procedure: RIGHT THYROID  LOBECTOMY;  Surgeon: Ethyl Lonni BRAVO, MD;  Location: [facility] OR;  Service: ENT;  Laterality: Right;   Patient Active Problem List   Diagnosis Date Noted   Right leg pain 12/13/2023   Chronic right-sided low back pain with right-sided sciatica 12/13/2023   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  Rationale for Evaluation and Treatment: Rehabilitation  THERAPY DIAG:  Other low back pain  Abnormal posture  Muscle spasm of back  ONSET DATE: Ongoing for LBP, 9/25 for right LE.  SUBJECTIVE:                                                                                                                                                                                           SUBJECTIVE STATEMENT: The patient reports having decreased pain with Rxs. Doing okay today with less pain 5/10   PERTINENT HISTORY:  She has seen a Chiropractor for years but will put these treatments on hold while receiving PT.  H/o right knee pain.     PAIN:  Are you having pain? Yes: NPRS scale: 5-6/10. Pain location: LB, right LE into posterior/lateral thigh that does not go below level of knee.   Pain description: Ache, throb, shooting.   Aggravating factors: As above.   Relieving factors: As above.    PRECAUTIONS: None  RED FLAGS: None   WEIGHT BEARING RESTRICTIONS: No  FALLS:  Has patient fallen in last 6 months? No  LIVING ENVIRONMENT: Lives with: lives with their family Lives in: House/apartment Has  following equipment at home: None  OCCUPATION: Retired.    PLOF: Independent  PATIENT GOALS: Avoid surgery.    OBJECTIVE:   DIAGNOSTIC FINDINGS:  03/13/24:  IMPRESSION: 1. New left subarticular disc protrusion at L3-L4 resulting in lateral recess narrowing with severe impingement upon the traversing left L4 nerve root. 2. Severe bilateral facet arthrosis at L4-5 with bilateral facet effusions, increased from prior. 3. Additional degenerative changes as above. 4. Right L5-S1 facet synovial cyst measuring 9 mm. No contribution to spinal canal stenosis.  PATIENT SURVEYS:  ODI:  23/50.     POSTURE: rounded shoulders, forward head, and flexed trunk .  Left pelvis higher than right.  Right knee genu valgum.    PALPATION: Tender to palpation with overpressure at L3-S1 and especially tender over the right SIJ.    LUMBAR ROM:   Full active lumbar flexion and extension to 15 degrees.    LOWER EXTREMITY ROM:     WNL.    LOWER EXTREMITY MMT:    WNL.    LUMBAR SPECIAL TESTS:  Equal leg lengths. Some pain reproduction with a right SLR.  No pain with right FABER test.  Diminished Achilles reflexes.    GAIT: Limpage gait over right LE.    TREATMENT DATE: 04/24/24:   Nustep x 11 mins L5 Pateint in left SDLY position with folded pillow between knees for comfort while receiving  US  combo 1.5 w/cm2 x 7 mins RT SIJ area STW/M x 23 minutes to her right side LB paras, SIJ , glute  and piriformis                                                                                                                        HMP and IFC at 80-150 Hz on 100% scan x 15 minutes to RT side LB and hip  Hooklying  PATIENT EDUCATION:  Education details:  Person educated:  International aid/development worker:  Education comprehension:   HOME EXERCISE PROGRAM:   ASSESSMENT:  CLINICAL IMPRESSION: The patient reports decreased pain for several hours last Rx.Rx focused on therex, US  combo,  and Manual STW to RT LB ,HIP and ITB with Pt LT side lyng and did well. IFC in hook lying today. Improved gait pattern when leaving. Doing better over all.      OBJECTIVE IMPAIRMENTS: Abnormal gait, decreased activity tolerance, difficulty walking, decreased ROM, increased muscle spasms, postural dysfunction, and pain.   ACTIVITY LIMITATIONS: carrying, lifting, bending, standing, and locomotion level  PARTICIPATION LIMITATIONS: meal prep, cleaning, and laundry  PERSONAL FACTORS: Time since onset of injury/illness/exacerbation are also affecting patient's functional outcome.   REHAB POTENTIAL: Good  CLINICAL DECISION MAKING: Evolving/moderate complexity  EVALUATION COMPLEXITY: Low   GOALS:  LONG TERM GOALS: Target date: 05/21/24  Ind with a HEP. Goal status: INITIAL  2.  Eliminate right LE pain/symptoms. Goal status: INITIAL  3.  Perform ADL's with pain not > 3/10.  Goal status: INITIAL  4.  Walk a community distance with pain not >  3/10.  Goal status: INITIAL  5.  Improve ODI score by at least 8 points.  Goal status: INITIAL  PLAN:  PT FREQUENCY: 2x/week  PT DURATION: 6 weeks  PLANNED INTERVENTIONS: 97110-Therapeutic exercises, 97530- Therapeutic activity, [id number]- Neuromuscular re-education, 97535- Self Care, 02859- Manual therapy, G0283- Electrical stimulation (unattended), 97035- Ultrasound, 02987- Traction (mechanical), Patient/Family education, and Moist heat.  PLAN FOR NEXT SESSION: Combo e'stim/US , STW/M.  Core  exercise progression.  Body mechanics training and spinal protection techniques.     Rowene Suto,CHRIS, PTA 04/24/2024, 12:06 PM  "

## 2024-04-25 MED ORDER — CEFDINIR 300 MG PO CAPS: 300.0000 mg | ORAL_CAPSULE | Freq: Two times a day (BID) | ORAL | 0 refills | Status: DC

## 2024-04-25 NOTE — Telephone Encounter (Signed)
 Patient was prescribed a zpak for sinus infection. Says that never works for her. Is feeling no better. Would like antibiotic changed.  Meds ordered this encounter  Medications   cefdinir  (OMNICEF ) 300 MG capsule    Sig: Take 1 capsule (300 mg total) by mouth 2 (two) times daily. 1 po BID    Dispense:  20 capsule    Refill:  0    Supervising Provider:   MARYANNE CHEW A [[id number]]   [name], FNP

## 2024-04-27 ENCOUNTER — Encounter: Payer: Self-pay | Admitting: Physical Therapy

## 2024-04-27 ENCOUNTER — Ambulatory Visit: Attending: Neurosurgery | Admitting: Physical Therapy

## 2024-04-27 DIAGNOSIS — M62830 Muscle spasm of back: Secondary | ICD-10-CM | POA: Insufficient documentation

## 2024-04-27 DIAGNOSIS — M5459 Other low back pain: Secondary | ICD-10-CM | POA: Insufficient documentation

## 2024-04-27 DIAGNOSIS — R293 Abnormal posture: Secondary | ICD-10-CM | POA: Insufficient documentation

## 2024-04-27 NOTE — Therapy (Signed)
 " OUTPATIENT PHYSICAL THERAPY THORACOLUMBAR TREATMENT   Patient Name: Toni Francis MRN: [id number] DOB:1956-03-07, 69 y.o., female Today's Date: 04/27/2024  END OF SESSION:  PT End of Session - 04/27/24 0938     Visit Number 6    Number of Visits 12    Date for Recertification  05/21/24    PT Start Time 0930    PT Stop Time 1022    PT Time Calculation (min) 52 min    Activity Tolerance Patient tolerated treatment well    Behavior During Therapy [facility] for tasks assessed/performed          Past Medical History:  Diagnosis Date   Abdominal hernia    Arthritis    GERD (gastroesophageal reflux disease)    Hypertension    Pinched nerve    L4-5,back   Past Surgical History:  Procedure Laterality Date   CHOLECYSTECTOMY     THYROIDECTOMY Right 08/13/2019   Procedure: RIGHT THYROID  LOBECTOMY;  Surgeon: Ethyl Lonni BRAVO, MD;  Location: [facility] OR;  Service: ENT;  Laterality: Right;   Patient Active Problem List   Diagnosis Date Noted   Right leg pain 12/13/2023   Chronic right-sided low back pain with right-sided sciatica 12/13/2023   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  Rationale for Evaluation and Treatment: Rehabilitation  THERAPY DIAG:  Other low back pain  Abnormal posture  Muscle spasm of back  ONSET DATE: Ongoing for LBP, 9/25 for right LE.  SUBJECTIVE:                                                                                                                                                                                           SUBJECTIVE STATEMENT: Pain at a 5 today and had trouble sleeping last night.    PERTINENT HISTORY:  She has seen a Chiropractor for  years but will put these treatments on hold while receiving PT.  H/o right knee pain.    PAIN:  Are you having pain? Yes: NPRS scale: 5-6/10. Pain location: LB, right LE into posterior/lateral thigh that does not go below level of knee.   Pain description: Ache, throb, shooting.   Aggravating factors: As above.   Relieving factors: As above.    PRECAUTIONS: None  RED FLAGS: None   WEIGHT BEARING RESTRICTIONS: No  FALLS:  Has patient fallen in last  6 months? No  LIVING ENVIRONMENT: Lives with: lives with their family Lives in: House/apartment Has following equipment at home: None  OCCUPATION: Retired.    PLOF: Independent  PATIENT GOALS: Avoid surgery.    OBJECTIVE:   DIAGNOSTIC FINDINGS:  03/13/24:  IMPRESSION: 1. New left subarticular disc protrusion at L3-L4 resulting in lateral recess narrowing with severe impingement upon the traversing left L4 nerve root. 2. Severe bilateral facet arthrosis at L4-5 with bilateral facet effusions, increased from prior. 3. Additional degenerative changes as above. 4. Right L5-S1 facet synovial cyst measuring 9 mm. No contribution to spinal canal stenosis.  PATIENT SURVEYS:  ODI:  23/50.     POSTURE: rounded shoulders, forward head, and flexed trunk .  Left pelvis higher than right.  Right knee genu valgum.    PALPATION: Tender to palpation with overpressure at L3-S1 and especially tender over the right SIJ.    LUMBAR ROM:   Full active lumbar flexion and extension to 15 degrees.    LOWER EXTREMITY ROM:     WNL.    LOWER EXTREMITY MMT:    WNL.    LUMBAR SPECIAL TESTS:  Equal leg lengths. Some pain reproduction with a right SLR.  No pain with right FABER test.  Diminished Achilles reflexes.    GAIT: Limpage gait over right LE.    TREATMENT DATE:   04/27/24:  Nustep x 12 minutes f/b STW/W x 12 minutes with patient in left sdly position with pillow between knees for comfort f/b HMP and IFC at 80-150 Hz on 40% scan  x 20 minutes.    04/24/24:   Nustep x 11 mins L5 Pateint in left SDLY position with folded pillow between knees for comfort while receiving  US  combo 1.5 w/cm2 x 7 mins RT SIJ area STW/M x 23 minutes to her right side LB paras, SIJ , glute  and piriformis                                                                                                                       HMP and IFC at 80-150 Hz on 100% scan x 15 minutes to RT side LB and hip  Hooklying  PATIENT EDUCATION:  Education details:  Person educated:  International aid/development worker:  Education comprehension:   HOME EXERCISE PROGRAM:   ASSESSMENT:  CLINICAL IMPRESSION: Increased pain and troubling sleeping lately.  She did well with treatment today and today without complaint.  Gait less antalgic after treatment.    OBJECTIVE IMPAIRMENTS: Abnormal gait, decreased activity tolerance, difficulty walking, decreased ROM, increased muscle spasms, postural dysfunction, and pain.   ACTIVITY LIMITATIONS: carrying, lifting, bending, standing, and locomotion level  PARTICIPATION LIMITATIONS: meal prep, cleaning, and laundry  PERSONAL FACTORS: Time since onset of injury/illness/exacerbation are also affecting patient's functional outcome.   REHAB POTENTIAL: Good  CLINICAL DECISION MAKING: Evolving/moderate complexity  EVALUATION COMPLEXITY: Low   GOALS:  LONG TERM GOALS: Target date: 05/21/24  Ind with a HEP. Goal status: INITIAL  2.  Eliminate right LE pain/symptoms. Goal status: INITIAL  3.  Perform ADL's with pain not > 3/10.  Goal status: INITIAL  4.  Walk a community distance with pain not > 3/10.  Goal status: INITIAL  5.  Improve ODI score by at least 8 points.  Goal status: INITIAL  PLAN:  PT FREQUENCY: 2x/week  PT DURATION: 6 weeks  PLANNED INTERVENTIONS: 97110-Therapeutic exercises, 97530- Therapeutic activity, [id number]- Neuromuscular re-education, 97535- Self Care, 02859- Manual therapy, G0283- Electrical  stimulation (unattended), 97035- Ultrasound, 02987- Traction (mechanical), Patient/Family education, and Moist heat.  PLAN FOR NEXT SESSION: Combo e'stim/US , STW/M.  Core exercise progression.  Body mechanics training and spinal protection techniques.     Caiden Arteaga, PT 04/27/2024, 10:51 AM  "

## 2024-05-01 ENCOUNTER — Ambulatory Visit: Admitting: *Deleted

## 2024-05-01 ENCOUNTER — Encounter: Payer: Self-pay | Admitting: *Deleted

## 2024-05-01 DIAGNOSIS — M62830 Muscle spasm of back: Secondary | ICD-10-CM

## 2024-05-01 DIAGNOSIS — M5459 Other low back pain: Secondary | ICD-10-CM

## 2024-05-01 DIAGNOSIS — R293 Abnormal posture: Secondary | ICD-10-CM

## 2024-05-01 NOTE — Therapy (Signed)
 " OUTPATIENT PHYSICAL THERAPY THORACOLUMBAR TREATMENT   Patient Name: Toni Francis MRN: [id number] DOB:17-Oct-1955, 69 y.o., female Today's Date: 05/01/2024  END OF SESSION:  PT End of Session - 05/01/24 0941     Visit Number 7    Number of Visits 12    Date for Recertification  05/21/24    PT Start Time 0930    PT Stop Time 1020    PT Time Calculation (min) 50 min          Past Medical History:  Diagnosis Date   Abdominal hernia    Arthritis    GERD (gastroesophageal reflux disease)    Hypertension    Pinched nerve    L4-5,back   Past Surgical History:  Procedure Laterality Date   CHOLECYSTECTOMY     THYROIDECTOMY Right 08/13/2019   Procedure: RIGHT THYROID  LOBECTOMY;  Surgeon: Ethyl Lonni BRAVO, MD;  Location: [facility] OR;  Service: ENT;  Laterality: Right;   Patient Active Problem List   Diagnosis Date Noted   Right leg pain 12/13/2023   Chronic right-sided low back pain with right-sided sciatica 12/13/2023   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  Rationale for Evaluation and Treatment: Rehabilitation  THERAPY DIAG:  Other low back pain  Abnormal posture  Muscle spasm of back  ONSET DATE: Ongoing for LBP, 9/25 for right LE.  SUBJECTIVE:                                                                                                                                                                                           SUBJECTIVE STATEMENT: Pain at a 5-6/10 today. TO MD Thursday. Better, but pain still comes and goes.  PERTINENT HISTORY:  She has seen a Chiropractor for years but will put these treatments on hold while receiving PT.  H/o right knee pain.    PAIN:   Are you having pain? Yes: NPRS scale: 5-6/10. Pain location: LB, right LE into posterior/lateral thigh that does not go below level of knee.   Pain description: Ache, throb, shooting.   Aggravating factors: As above.   Relieving factors: As above.    PRECAUTIONS: None  RED FLAGS: None   WEIGHT BEARING RESTRICTIONS: No  FALLS:  Has patient fallen in last 6 months? No  LIVING ENVIRONMENT: Lives with: lives with their family Lives in: House/apartment Has following  equipment at home: None  OCCUPATION: Retired.    PLOF: Independent  PATIENT GOALS: Avoid surgery.    OBJECTIVE:   DIAGNOSTIC FINDINGS:  03/13/24:  IMPRESSION: 1. New left subarticular disc protrusion at L3-L4 resulting in lateral recess narrowing with severe impingement upon the traversing left L4 nerve root. 2. Severe bilateral facet arthrosis at L4-5 with bilateral facet effusions, increased from prior. 3. Additional degenerative changes as above. 4. Right L5-S1 facet synovial cyst measuring 9 mm. No contribution to spinal canal stenosis.  PATIENT SURVEYS:  ODI:  23/50.     POSTURE: rounded shoulders, forward head, and flexed trunk .  Left pelvis higher than right.  Right knee genu valgum.    PALPATION: Tender to palpation with overpressure at L3-S1 and especially tender over the right SIJ.    LUMBAR ROM:   Full active lumbar flexion and extension to 15 degrees.    LOWER EXTREMITY ROM:     WNL.    LOWER EXTREMITY MMT:    WNL.    LUMBAR SPECIAL TESTS:  Equal leg lengths. Some pain reproduction with a right SLR.  No pain with right FABER test.  Diminished Achilles reflexes.    GAIT: Limpage gait over right LE.    TREATMENT DATE:   05/01/24:  Nustep x 12  minutes L4 STW/W x 12 minutes with patient in left sdly position with pillow between knees for comfort  US  combo x 8 mins  1.5 w/cm2  to RT piriformis  HMP and IFC at 80-150 Hz on 40% scan x 15 minutes.    04/24/24:   Nustep x 11 mins  L5 Pateint in left SDLY position with folded pillow between knees for comfort while receiving  US  combo 1.5 w/cm2 x 7 mins RT SIJ area STW/M x 23 minutes to her right side LB paras, SIJ , glute  and piriformis                                                                                                                       HMP and IFC at 80-150 Hz on 100% scan x 15 minutes to RT side LB and hip  Hooklying  PATIENT EDUCATION:  Education details:  Person educated:  International aid/development worker:  Education comprehension:   HOME EXERCISE PROGRAM:   ASSESSMENT:  CLINICAL IMPRESSION:   Pt reports doing better after Rxs and over all, but continue to have Intermittent RT hip, LE, and LBP. Rx focused on Therex, US , and STW. Pt reports decreased pain end of session.  OBJECTIVE IMPAIRMENTS: Abnormal gait, decreased activity tolerance, difficulty walking, decreased ROM, increased muscle spasms, postural dysfunction, and pain.   ACTIVITY LIMITATIONS: carrying, lifting, bending, standing, and locomotion level  PARTICIPATION LIMITATIONS: meal prep, cleaning, and laundry  PERSONAL FACTORS: Time since onset of injury/illness/exacerbation are also affecting patient's functional outcome.   REHAB POTENTIAL: Good  CLINICAL DECISION MAKING: Evolving/moderate complexity  EVALUATION COMPLEXITY: Low   GOALS:  LONG TERM GOALS: Target date: 05/21/24  Ind with a  HEP. Goal status: INITIAL  2.  Eliminate right LE pain/symptoms. Goal status: INITIAL  3.  Perform ADL's with pain not > 3/10.  Goal status: INITIAL  4.  Walk a community distance with pain not > 3/10.  Goal status: INITIAL  5.  Improve ODI score by at least 8 points.  Goal status: INITIAL  PLAN:  PT FREQUENCY: 2x/week  PT DURATION: 6 weeks  PLANNED INTERVENTIONS: 97110-Therapeutic exercises, 97530- Therapeutic activity, [id number]- Neuromuscular re-education, 97535- Self Care, 02859- Manual therapy, [id number]- Electrical stimulation  (unattended), 97035- Ultrasound, 02987- Traction (mechanical), Patient/Family education, and Moist heat.  PLAN FOR NEXT SESSION: Combo e'stim/US , STW/M.  Core exercise progression.  Body mechanics training and spinal protection techniques.     Grove Defina,CHRIS, PTA 05/01/2024, 1:20 PM  "

## 2024-05-04 ENCOUNTER — Ambulatory Visit: Admitting: *Deleted

## 2024-05-04 ENCOUNTER — Encounter: Payer: Self-pay | Admitting: *Deleted

## 2024-05-04 DIAGNOSIS — M5459 Other low back pain: Secondary | ICD-10-CM | POA: Diagnosis not present

## 2024-05-04 DIAGNOSIS — R293 Abnormal posture: Secondary | ICD-10-CM

## 2024-05-04 DIAGNOSIS — M62830 Muscle spasm of back: Secondary | ICD-10-CM

## 2024-05-04 NOTE — Therapy (Signed)
 " OUTPATIENT PHYSICAL THERAPY THORACOLUMBAR TREATMENT   Patient Name: Toni Francis MRN: [id number] DOB:[birth year], 69 y.o., female Today's Date: 05/04/2024  END OF SESSION:  PT End of Session - 05/04/24 0948     Visit Number 8    Number of Visits 12    Date for Recertification  05/21/24    PT Start Time 0930    PT Stop Time 1028    PT Time Calculation (min) 58 min          Past Medical History:  Diagnosis Date   Abdominal hernia    Arthritis    GERD (gastroesophageal reflux disease)    Hypertension    Pinched nerve    L4-5,back   Past Surgical History:  Procedure Laterality Date   CHOLECYSTECTOMY     THYROIDECTOMY Right 08/13/2019   Procedure: RIGHT THYROID  LOBECTOMY;  Surgeon: Ethyl Lonni BRAVO, MD;  Location: [facility] OR;  Service: ENT;  Laterality: Right;   Patient Active Problem List   Diagnosis Date Noted   Right leg pain 12/13/2023   Chronic right-sided low back pain with right-sided sciatica 12/13/2023   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  Rationale for Evaluation and Treatment: Rehabilitation  THERAPY DIAG:  Other low back pain  Abnormal posture  Muscle spasm of back  ONSET DATE: Ongoing for LBP, 9/25 for right LE.  SUBJECTIVE:                                                                                                                                                                                           SUBJECTIVE STATEMENT: Pain at a 4/10 today. MD said to cont. PT  PERTINENT HISTORY:  She has seen a Chiropractor for years but will put these treatments on hold while receiving PT.  H/o right knee pain.    PAIN:  Are you having pain? Yes: NPRS scale:  5-6/10. Pain location: LB, right LE into posterior/lateral thigh that does not go below level of knee.   Pain description: Ache, throb, shooting.   Aggravating factors: As above.   Relieving factors: As above.    PRECAUTIONS: None  RED FLAGS: None   WEIGHT BEARING RESTRICTIONS: No  FALLS:  Has patient fallen in last 6 months? No  LIVING ENVIRONMENT: Lives with: lives with their family Lives in: House/apartment Has following equipment at home: None  OCCUPATION: Retired.    PLOF: Independent  PATIENT GOALS: Avoid surgery.    OBJECTIVE:   DIAGNOSTIC FINDINGS:  03/13/24:  IMPRESSION: 1. New left subarticular disc protrusion at L3-L4 resulting in lateral recess narrowing with severe impingement upon the traversing left L4 nerve root. 2. Severe bilateral facet arthrosis at L4-5 with bilateral facet effusions, increased from prior. 3. Additional degenerative changes as above. 4. Right L5-S1 facet synovial cyst measuring 9 mm. No contribution to spinal canal stenosis.  PATIENT SURVEYS:  ODI:  23/50.     POSTURE: rounded shoulders, forward head, and flexed trunk .  Left pelvis higher than right.  Right knee genu valgum.    PALPATION: Tender to palpation with overpressure at L3-S1 and especially tender over the right SIJ.    LUMBAR ROM:   Full active lumbar flexion and extension to 15 degrees.    LOWER EXTREMITY ROM:     WNL.    LOWER EXTREMITY MMT:    WNL.    LUMBAR SPECIAL TESTS:  Equal leg lengths. Some pain reproduction with a right SLR.  No pain with right FABER test.  Diminished Achilles reflexes.    GAIT: Limpage gait over right LE.    TREATMENT DATE:   05/04/24:  Nustep x 12  minutes L4 STW/W x 15 minutes with patient in left sdly position with pillow between knees for comfort  US  combo x 12 mins  1.5 w/cm2  to RT piriformis  HMP and IFC at 80-150 Hz on 40% scan x 15 minutes.    04/24/24:   Nustep x 11 mins L5 Pateint in left SDLY position with  folded pillow between knees for comfort while receiving  US  combo 1.5 w/cm2 x 7 mins RT SIJ area STW/M x 23 minutes to her right side LB paras, SIJ , glute  and piriformis                                                                                                                       HMP and IFC at 80-150 Hz on 100% scan x 15 minutes to RT side LB and hip  Hooklying  PATIENT EDUCATION:  Education details:  Person educated:  International aid/development worker:  Education comprehension:   HOME EXERCISE PROGRAM:   ASSESSMENT:  CLINICAL IMPRESSION:   Pt reports MD wants her to continue with PT. She is doing better over all, but continues to have Intermittent RT hip, LE, and LBP. Rx focused again  on Therex, US , and STW to RT side with notable TP in piriformis area. Pt reports decreased pain end of session.  OBJECTIVE IMPAIRMENTS: Abnormal gait, decreased activity tolerance, difficulty walking, decreased ROM, increased muscle spasms, postural dysfunction, and pain.   ACTIVITY LIMITATIONS: carrying, lifting, bending, standing, and locomotion level  PARTICIPATION LIMITATIONS: meal prep, cleaning, and laundry  PERSONAL FACTORS: Time since onset of injury/illness/exacerbation are also affecting patient's functional outcome.   REHAB POTENTIAL: Good  CLINICAL DECISION MAKING: Evolving/moderate complexity  EVALUATION COMPLEXITY: Low  GOALS:  LONG TERM GOALS: Target date: 05/21/24  Ind with a HEP. Goal status: ON going  2.  Eliminate right LE pain/symptoms. Goal status: On going  3.  Perform ADL's with pain not > 3/10.  Goal status: On going  4.  Walk a community distance with pain not > 3/10.  Goal status: On going  5.  Improve ODI score by at least 8 points.  Goal status: On going  PLAN:  PT FREQUENCY: 2x/week  PT DURATION: 6 weeks  PLANNED INTERVENTIONS: 97110-Therapeutic exercises, 97530- Therapeutic activity, [id number]- Neuromuscular re-education, 97535- Self Care, 02859- Manual  therapy, G0283- Electrical stimulation (unattended), 97035- Ultrasound, 02987- Traction (mechanical), Patient/Family education, and Moist heat.  PLAN FOR NEXT SESSION: Combo e'stim/US , STW/M.  Core exercise progression.  Body mechanics training and spinal protection techniques.     Ladena Jacquez,CHRIS, PTA 05/04/2024, 1:38 PM  "

## 2024-05-05 ENCOUNTER — Encounter: Payer: Self-pay | Admitting: Nurse Practitioner

## 2024-05-08 ENCOUNTER — Ambulatory Visit: Admitting: *Deleted

## 2024-05-08 ENCOUNTER — Encounter: Payer: Self-pay | Admitting: *Deleted

## 2024-05-08 DIAGNOSIS — M5459 Other low back pain: Secondary | ICD-10-CM | POA: Diagnosis not present

## 2024-05-08 DIAGNOSIS — M62830 Muscle spasm of back: Secondary | ICD-10-CM

## 2024-05-08 DIAGNOSIS — R293 Abnormal posture: Secondary | ICD-10-CM

## 2024-05-08 NOTE — Therapy (Signed)
 " OUTPATIENT PHYSICAL THERAPY THORACOLUMBAR TREATMENT   Patient Name: Toni Francis MRN: [id number] DOB:29-Jun-1955, 69 y.o., female Today's Date: 05/08/2024  END OF SESSION:  PT End of Session - 05/08/24 1024     Visit Number 9    Number of Visits 12    Date for Recertification  05/21/24    PT Start Time 1015    PT Stop Time 1116    PT Time Calculation (min) 61 min          Past Medical History:  Diagnosis Date   Abdominal hernia    Arthritis    GERD (gastroesophageal reflux disease)    Hypertension    Pinched nerve    L4-5,back   Past Surgical History:  Procedure Laterality Date   CHOLECYSTECTOMY     THYROIDECTOMY Right 08/13/2019   Procedure: RIGHT THYROID  LOBECTOMY;  Surgeon: Ethyl Lonni BRAVO, MD;  Location: [facility] OR;  Service: ENT;  Laterality: Right;   Patient Active Problem List   Diagnosis Date Noted   Right leg pain 12/13/2023   Chronic right-sided low back pain with right-sided sciatica 12/13/2023   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  Rationale for Evaluation and Treatment: Rehabilitation  THERAPY DIAG:  Other low back pain  Abnormal posture  Muscle spasm of back  ONSET DATE: Ongoing for LBP, 9/25 for right LE.  SUBJECTIVE:                                                                                                                                                                                           SUBJECTIVE STATEMENT: Pain at a 4/10 today.   PERTINENT HISTORY:  She has seen a Chiropractor for years but will put these treatments on hold while receiving PT.  H/o right knee pain.    PAIN:  Are you having pain? Yes: NPRS scale: 5-6/10. Pain  location: LB, right LE into posterior/lateral thigh that does not go below level of knee.   Pain description: Ache, throb, shooting.   Aggravating factors: As above.   Relieving factors: As above.    PRECAUTIONS: None  RED FLAGS: None   WEIGHT BEARING RESTRICTIONS: No  FALLS:  Has patient fallen in last 6 months? No  LIVING ENVIRONMENT: Lives with: lives with their family Lives in: House/apartment Has following equipment at home: None  OCCUPATION: Retired.  PLOF: Independent  PATIENT GOALS: Avoid surgery.    OBJECTIVE:   DIAGNOSTIC FINDINGS:  03/13/24:  IMPRESSION: 1. New left subarticular disc protrusion at L3-L4 resulting in lateral recess narrowing with severe impingement upon the traversing left L4 nerve root. 2. Severe bilateral facet arthrosis at L4-5 with bilateral facet effusions, increased from prior. 3. Additional degenerative changes as above. 4. Right L5-S1 facet synovial cyst measuring 9 mm. No contribution to spinal canal stenosis.  PATIENT SURVEYS:  ODI:  23/50.     POSTURE: rounded shoulders, forward head, and flexed trunk .  Left pelvis higher than right.  Right knee genu valgum.    PALPATION: Tender to palpation with overpressure at L3-S1 and especially tender over the right SIJ.    LUMBAR ROM:   Full active lumbar flexion and extension to 15 degrees.    LOWER EXTREMITY ROM:     WNL.    LOWER EXTREMITY MMT:    WNL.    LUMBAR SPECIAL TESTS:  Equal leg lengths. Some pain reproduction with a right SLR.  No pain with right FABER test.  Diminished Achilles reflexes.    GAIT: Limpage gait over right LE.    TREATMENT DATE:   05/08/24:  Nustep x 12  minutes L4 STW/W x 15 minutes with patient in left sdly position with pillow between knees for comfort  US  combo x 12 mins  1.5 w/cm2  to RT piriformis  HMP and IFC at 80-150 Hz on 40% scan x 15 minutes.    04/24/24:   Nustep x 11 mins L5 Pateint in left SDLY position with folded pillow  between knees for comfort while receiving  US  combo 1.5 w/cm2 x 7 mins RT SIJ area STW/M x 23 minutes to her right side LB paras, SIJ , glute  and piriformis                                                                                                                       HMP and IFC at 80-150 Hz on 100% scan x 15 minutes to RT side LB and hip  Hooklying  PATIENT EDUCATION:  Education details:  Person educated:  International aid/development worker:  Education comprehension:   HOME EXERCISE PROGRAM:   ASSESSMENT:  CLINICAL IMPRESSION:   Pt reports doing okay after last Rx and reports doing better at ADL's.some days. She was able to continue with therex, STW,US , and IFC and did well with decreased pain end of session    OBJECTIVE IMPAIRMENTS: Abnormal gait, decreased activity tolerance, difficulty walking, decreased ROM, increased muscle spasms, postural dysfunction, and pain.   ACTIVITY LIMITATIONS: carrying, lifting, bending, standing, and locomotion level  PARTICIPATION LIMITATIONS: meal prep, cleaning, and laundry  PERSONAL FACTORS: Time since onset of injury/illness/exacerbation are also affecting patient's functional outcome.   REHAB POTENTIAL: Good  CLINICAL DECISION MAKING: Evolving/moderate complexity  EVALUATION COMPLEXITY: Low   GOALS:  LONG TERM GOALS: Target date: 05/21/24  Ind with a HEP. Goal status: ON going  2.  Eliminate right LE pain/symptoms. Goal status: On going  3.  Perform ADL's with pain not > 3/10.  Goal status: On going  4.  Walk a community distance with pain not > 3/10.  Goal status: On going  5.  Improve ODI score by at least 8 points.  Goal status: On going  PLAN:  PT FREQUENCY: 2x/week  PT DURATION: 6 weeks  PLANNED INTERVENTIONS: 97110-Therapeutic exercises, 97530- Therapeutic activity, [id number]- Neuromuscular re-education, 97535- Self Care, 02859- Manual therapy, G0283- Electrical stimulation (unattended), 97035- Ultrasound, 02987- Traction  (mechanical), Patient/Family education, and Moist heat.  PLAN FOR NEXT SESSION: Combo e'stim/US , STW/M.  Core exercise progression.  Body mechanics training and spinal protection techniques.     Verleen Stuckey,CHRIS, PTA 05/08/2024, 2:11 PM  "

## 2024-05-11 ENCOUNTER — Encounter: Payer: Self-pay | Admitting: *Deleted

## 2024-05-11 ENCOUNTER — Ambulatory Visit: Admitting: *Deleted

## 2024-05-11 DIAGNOSIS — R293 Abnormal posture: Secondary | ICD-10-CM

## 2024-05-11 DIAGNOSIS — M5459 Other low back pain: Secondary | ICD-10-CM | POA: Diagnosis not present

## 2024-05-11 DIAGNOSIS — M62830 Muscle spasm of back: Secondary | ICD-10-CM

## 2024-05-11 NOTE — Therapy (Signed)
 " OUTPATIENT PHYSICAL THERAPY THORACOLUMBAR TREATMENT   Patient Name: Toni Francis MRN: [id number] DOB:July 15, 1955, 69 y.o., female Today's Date: 05/11/2024  END OF SESSION:  PT End of Session - 05/11/24 0935     Visit Number 10    Number of Visits 12    Date for Recertification  05/21/24    PT Start Time 0930          Past Medical History:  Diagnosis Date   Abdominal hernia    Arthritis    GERD (gastroesophageal reflux disease)    Hypertension    Pinched nerve    L4-5,back   Past Surgical History:  Procedure Laterality Date   CHOLECYSTECTOMY     THYROIDECTOMY Right 08/13/2019   Procedure: RIGHT THYROID  LOBECTOMY;  Surgeon: Ethyl Lonni BRAVO, MD;  Location: [facility] OR;  Service: ENT;  Laterality: Right;   Patient Active Problem List   Diagnosis Date Noted   Right leg pain 12/13/2023   Chronic right-sided low back pain with right-sided sciatica 12/13/2023   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  Rationale for Evaluation and Treatment: Rehabilitation  THERAPY DIAG:  Other low back pain  Abnormal posture  Muscle spasm of back  ONSET DATE: Ongoing for LBP, 9/25 for right LE.  SUBJECTIVE:                                                                                                                                                                                           SUBJECTIVE STATEMENT: Pain at a 4/10 today. Slept Really good last night  PERTINENT HISTORY:  She has seen a Chiropractor for years but will put these treatments on hold while receiving PT.  H/o right knee pain.    PAIN:  Are you having pain? Yes: NPRS scale: 5-6/10. Pain location: LB, right LE into  posterior/lateral thigh that does not go below level of knee.   Pain description: Ache, throb, shooting.   Aggravating factors: As above.   Relieving factors: As above.    PRECAUTIONS: None  RED FLAGS: None   WEIGHT BEARING RESTRICTIONS: No  FALLS:  Has patient fallen in last 6 months? No  LIVING ENVIRONMENT: Lives with: lives with their family Lives in: House/apartment Has following equipment at home: None  OCCUPATION: Retired.    PLOF: Independent  PATIENT GOALS: Avoid surgery.    OBJECTIVE:  DIAGNOSTIC FINDINGS:  03/13/24:  IMPRESSION: 1. New left subarticular disc protrusion at L3-L4 resulting in lateral recess narrowing with severe impingement upon the traversing left L4 nerve root. 2. Severe bilateral facet arthrosis at L4-5 with bilateral facet effusions, increased from prior. 3. Additional degenerative changes as above. 4. Right L5-S1 facet synovial cyst measuring 9 mm. No contribution to spinal canal stenosis.  PATIENT SURVEYS:  ODI:  23/50.     POSTURE: rounded shoulders, forward head, and flexed trunk .  Left pelvis higher than right.  Right knee genu valgum.    PALPATION: Tender to palpation with overpressure at L3-S1 and especially tender over the right SIJ.    LUMBAR ROM:   Full active lumbar flexion and extension to 15 degrees.    LOWER EXTREMITY ROM:     WNL.    LOWER EXTREMITY MMT:    WNL.    LUMBAR SPECIAL TESTS:  Equal leg lengths. Some pain reproduction with a right SLR.  No pain with right FABER test.  Diminished Achilles reflexes.    GAIT: Limpage gait over right LE.    TREATMENT DATE:   05/11/24:  Nustep x 12  minutes L4 STW/W x 16 minutes to RT LB paras as well as RT glute and piriformis with patient in left sdly position with pillow between knees for comfort  US  combo x 12 mins  1.5 w/cm2  to RT piriformis / LB paras HMP and IFC at 80-150 Hz on 40% scan x 15 minutes.    04/24/24:   Nustep x 11 mins L5 Pateint in left  SDLY position with folded pillow between knees for comfort while receiving  US  combo 1.5 w/cm2 x 7 mins RT SIJ area STW/M x 23 minutes to her right side LB paras, SIJ , glute  and piriformis                                                                                                                       HMP and IFC at 80-150 Hz on 100% scan x 15 minutes to RT side LB and hip  Hooklying  PATIENT EDUCATION:  Education details:  Person educated:  International aid/development worker:  Education comprehension:   HOME EXERCISE PROGRAM:   ASSESSMENT:  CLINICAL IMPRESSION:   Pt reports doing better today and was able to sleep last night.  She was able to continue with therex, STW,US , and IFC and did well with decreased pain end of session. Able to meet STG#1, but other LTGs are ongoing due to pain.    OBJECTIVE IMPAIRMENTS: Abnormal gait, decreased activity tolerance, difficulty walking, decreased ROM, increased muscle spasms, postural dysfunction, and pain.   ACTIVITY LIMITATIONS: carrying, lifting, bending, standing, and locomotion level  PARTICIPATION LIMITATIONS: meal prep, cleaning, and laundry  PERSONAL FACTORS: Time since onset of injury/illness/exacerbation are also affecting patient's functional outcome.   REHAB POTENTIAL: Good  CLINICAL DECISION MAKING: Evolving/moderate complexity  EVALUATION COMPLEXITY: Low   GOALS:  LONG TERM GOALS: Target date: 05/21/24  Ind with a HEP. Goal status: MET  2.  Eliminate right LE pain/symptoms. Goal status: On going  3.  Perform ADL's with pain not > 3/10.  Goal status: On going    5-7/10  4.  Walk a community distance with pain not > 3/10.  Goal status: On going  5.  Improve ODI score by at least 8 points.  Goal status: On going  PLAN:  PT FREQUENCY: 2x/week  PT DURATION: 6 weeks  PLANNED INTERVENTIONS: 97110-Therapeutic exercises, 97530- Therapeutic activity, [id number]- Neuromuscular re-education, 97535- Self Care, 02859- Manual  therapy, G0283- Electrical stimulation (unattended), 97035- Ultrasound, 02987- Traction (mechanical), Patient/Family education, and Moist heat.  PLAN FOR NEXT SESSION: Combo e'stim/US , STW/M.  Core exercise progression.  Body mechanics training and spinal protection techniques.     Ameka Krigbaum,CHRIS, PTA 05/11/2024, 9:36 AM  "

## 2024-05-15 ENCOUNTER — Ambulatory Visit: Admitting: *Deleted

## 2024-05-15 DIAGNOSIS — M5459 Other low back pain: Secondary | ICD-10-CM

## 2024-05-15 DIAGNOSIS — R293 Abnormal posture: Secondary | ICD-10-CM

## 2024-05-15 DIAGNOSIS — M62830 Muscle spasm of back: Secondary | ICD-10-CM

## 2024-05-15 NOTE — Therapy (Signed)
 " OUTPATIENT PHYSICAL THERAPY THORACOLUMBAR TREATMENT   Patient Name: Toni Francis MRN: [id number] DOB:[birth year], 69 y.o., female Today's Date: 05/15/2024  END OF SESSION:  PT End of Session - 05/15/24 0952     Visit Number 11    Number of Visits 12    Date for Recertification  05/21/24    PT Start Time 0930    PT Stop Time 1029    PT Time Calculation (min) 59 min          Past Medical History:  Diagnosis Date   Abdominal hernia    Arthritis    GERD (gastroesophageal reflux disease)    Hypertension    Pinched nerve    L4-5,back   Past Surgical History:  Procedure Laterality Date   CHOLECYSTECTOMY     THYROIDECTOMY Right 08/13/2019   Procedure: RIGHT THYROID  LOBECTOMY;  Surgeon: Ethyl Lonni BRAVO, MD;  Location: [facility] OR;  Service: ENT;  Laterality: Right;   Patient Active Problem List   Diagnosis Date Noted   Right leg pain 12/13/2023   Chronic right-sided low back pain with right-sided sciatica 12/13/2023   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  Rationale for Evaluation and Treatment: Rehabilitation  THERAPY DIAG:  Other low back pain  Abnormal posture  Muscle spasm of back  ONSET DATE: Ongoing for LBP, 9/25 for right LE.  SUBJECTIVE:                                                                                                                                                                                           SUBJECTIVE STATEMENT: Pain at a 4/10 today. Did really good after last Rx and was able to walk a while without pain, but today I am hurting again.  PERTINENT HISTORY:  She has seen a Chiropractor for years but will put these treatments on hold while  receiving PT.  H/o right knee pain.    PAIN:  Are you having pain? Yes: NPRS scale: 5-6/10. Pain location: LB, right LE into posterior/lateral thigh that does not go below level of knee.   Pain description: Ache, throb, shooting.   Aggravating factors: As above.   Relieving factors: As above.    PRECAUTIONS: None  RED FLAGS: None   WEIGHT BEARING RESTRICTIONS: No  FALLS:  Has patient fallen in last 6 months? No  LIVING ENVIRONMENT:  Lives with: lives with their family Lives in: House/apartment Has following equipment at home: None  OCCUPATION: Retired.    PLOF: Independent  PATIENT GOALS: Avoid surgery.    OBJECTIVE:   DIAGNOSTIC FINDINGS:  03/13/24:  IMPRESSION: 1. New left subarticular disc protrusion at L3-L4 resulting in lateral recess narrowing with severe impingement upon the traversing left L4 nerve root. 2. Severe bilateral facet arthrosis at L4-5 with bilateral facet effusions, increased from prior. 3. Additional degenerative changes as above. 4. Right L5-S1 facet synovial cyst measuring 9 mm. No contribution to spinal canal stenosis.  PATIENT SURVEYS:  ODI:  23/50.     POSTURE: rounded shoulders, forward head, and flexed trunk .  Left pelvis higher than right.  Right knee genu valgum.    PALPATION: Tender to palpation with overpressure at L3-S1 and especially tender over the right SIJ.    LUMBAR ROM:   Full active lumbar flexion and extension to 15 degrees.    LOWER EXTREMITY ROM:     WNL.    LOWER EXTREMITY MMT:    WNL.    LUMBAR SPECIAL TESTS:  Equal leg lengths. Some pain reproduction with a right SLR.  No pain with right FABER test.  Diminished Achilles reflexes.    GAIT: Limpage gait over right LE.    TREATMENT DATE:   05/15/24:  Nustep x 12  minutes L4 STW/W and TPR  x 26 minutes to RT LB paras as well as RT glut and piriformis with patient in left sdly position with pillow between knees for comfort   HMP and IFC at 80-150 Hz on  40% scan x 15 minutes.    04/24/24:   Nustep x 11 mins L5 Pateint in left SDLY position with folded pillow between knees for comfort while receiving  US  combo 1.5 w/cm2 x 7 mins RT SIJ area STW/M x 23 minutes to her right side LB paras, SIJ , glute  and piriformis                                                                                                                       HMP and IFC at 80-150 Hz on 100% scan x 15 minutes to RT side LB and hip  Hooklying  PATIENT EDUCATION:  Education details:  Person educated:  International aid/development worker:  Education comprehension:   HOME EXERCISE PROGRAM:   ASSESSMENT:  CLINICAL IMPRESSION:   Pt reports doing better today and was able to go walking with no pain after last Rx, but had a bad day yesterday. She was able to continue with therex, STW/ TPR  and IFC and did well with decreased pain end of session. Able to meet STG#1, but other LTGs are ongoing due to pain. Add visits from previous January 8th MD visit.    OBJECTIVE IMPAIRMENTS: Abnormal gait, decreased activity tolerance, difficulty walking, decreased ROM, increased muscle spasms, postural dysfunction, and pain.   ACTIVITY LIMITATIONS: carrying, lifting, bending, standing, and locomotion level  PARTICIPATION LIMITATIONS: meal prep,  cleaning, and laundry  PERSONAL FACTORS: Time since onset of injury/illness/exacerbation are also affecting patient's functional outcome.   REHAB POTENTIAL: Good  CLINICAL DECISION MAKING: Evolving/moderate complexity  EVALUATION COMPLEXITY: Low   GOALS:  LONG TERM GOALS: Target date: 05/21/24  Ind with a HEP. Goal status: MET  2.  Eliminate right LE pain/symptoms. Goal status: On going  3.  Perform ADL's with pain not > 3/10.  Goal status: On going    5-7/10  4.  Walk a community distance with pain not > 3/10.  Goal status: On going  5.  Improve ODI score by at least 8 points.  Goal status: On going  PLAN:  PT FREQUENCY: 2x/week  PT  DURATION: 6 weeks  PLANNED INTERVENTIONS: 97110-Therapeutic exercises, 97530- Therapeutic activity, [id number]- Neuromuscular re-education, 97535- Self Care, 02859- Manual therapy, G0283- Electrical stimulation (unattended), 97035- Ultrasound, 02987- Traction (mechanical), Patient/Family education, and Moist heat.  PLAN FOR NEXT SESSION: Combo e'stim/US , STW/M. IFC, Core exercise progression.  Body mechanics training and spinal protection techniques.     Taeshawn Helfman,CHRIS, PTA 05/15/2024, 4:14 PM  "

## 2024-05-16 NOTE — Addendum Note (Signed)
 Addended by: Tico Crotteau W on: 05/16/2024 11:20 AM   Modules accepted: Orders

## 2024-05-18 ENCOUNTER — Ambulatory Visit: Admitting: *Deleted

## 2024-05-18 DIAGNOSIS — M62830 Muscle spasm of back: Secondary | ICD-10-CM

## 2024-05-18 DIAGNOSIS — M5459 Other low back pain: Secondary | ICD-10-CM | POA: Diagnosis not present

## 2024-05-18 DIAGNOSIS — R293 Abnormal posture: Secondary | ICD-10-CM

## 2024-05-18 NOTE — Therapy (Signed)
 " OUTPATIENT PHYSICAL THERAPY THORACOLUMBAR TREATMENT   Patient Name: Toni Francis MRN: [id number] DOB:[birth year], 69 y.o., female Today's Date: 05/18/2024  END OF SESSION:  PT End of Session - 05/18/24 0953     Visit Number 12    Number of Visits 18    Date for Recertification  06/11/24    PT Start Time 0930    PT Stop Time 1028    PT Time Calculation (min) 58 min          Past Medical History:  Diagnosis Date   Abdominal hernia    Arthritis    GERD (gastroesophageal reflux disease)    Hypertension    Pinched nerve    L4-5,back   Past Surgical History:  Procedure Laterality Date   CHOLECYSTECTOMY     THYROIDECTOMY Right 08/13/2019   Procedure: RIGHT THYROID  LOBECTOMY;  Surgeon: Ethyl Lonni BRAVO, MD;  Location: [facility] OR;  Service: ENT;  Laterality: Right;   Patient Active Problem List   Diagnosis Date Noted   Right leg pain 12/13/2023   Chronic right-sided low back pain with right-sided sciatica 12/13/2023   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  Rationale for Evaluation and Treatment: Rehabilitation  THERAPY DIAG:  Other low back pain  Abnormal posture  Muscle spasm of back  ONSET DATE: Ongoing for LBP, 9/25 for right LE.  SUBJECTIVE:                                                                                                                                                                                           SUBJECTIVE STATEMENT: Pain at a 5-6/10. Pain keeps coming and going. Will call MD  PERTINENT HISTORY:  She has seen a Chiropractor for years but will put these treatments on hold while receiving PT.  H/o right knee pain.    PAIN:  Are you having  pain? Yes: NPRS scale: 5-6/10. Pain location: LB, right LE into posterior/lateral thigh that does not go below level of knee.   Pain description: Ache, throb, shooting.   Aggravating factors: As above.   Relieving factors: As above.    PRECAUTIONS: None  RED FLAGS: None   WEIGHT BEARING RESTRICTIONS: No  FALLS:  Has patient fallen in last 6 months? No  LIVING ENVIRONMENT: Lives with: lives with their family Lives in: House/apartment Has following equipment at home:  None  OCCUPATION: Retired.    PLOF: Independent  PATIENT GOALS: Avoid surgery.    OBJECTIVE:   DIAGNOSTIC FINDINGS:  03/13/24:  IMPRESSION: 1. New left subarticular disc protrusion at L3-L4 resulting in lateral recess narrowing with severe impingement upon the traversing left L4 nerve root. 2. Severe bilateral facet arthrosis at L4-5 with bilateral facet effusions, increased from prior. 3. Additional degenerative changes as above. 4. Right L5-S1 facet synovial cyst measuring 9 mm. No contribution to spinal canal stenosis.  PATIENT SURVEYS:  ODI:  23/50.     POSTURE: rounded shoulders, forward head, and flexed trunk .  Left pelvis higher than right.  Right knee genu valgum.    PALPATION: Tender to palpation with overpressure at L3-S1 and especially tender over the right SIJ.    LUMBAR ROM:   Full active lumbar flexion and extension to 15 degrees.    LOWER EXTREMITY ROM:     WNL.    LOWER EXTREMITY MMT:    WNL.    LUMBAR SPECIAL TESTS:  Equal leg lengths. Some pain reproduction with a right SLR.  No pain with right FABER test.  Diminished Achilles reflexes.    GAIT: Limpage gait over right LE.    TREATMENT DATE:   05/18/24:  Nustep x 14  minutes L4 STW/W and TPR  x 26 minutes to RT LB paras as well as RT glut and piriformis with patient in left sdly position with pillow between knees for comfort   HMP and IFC at 80-150 Hz on 40% scan x 15 minutes.    04/24/24:   Nustep x 11 mins  L5 Pateint in left SDLY position with folded pillow between knees for comfort while receiving  US  combo 1.5 w/cm2 x 7 mins RT SIJ area STW/M x 23 minutes to her right side LB paras, SIJ , glute  and piriformis                                                                                                                       HMP and IFC at 80-150 Hz on 100% scan x 15 minutes to RT side LB and hip  Hooklying  PATIENT EDUCATION:  Education details:  Person educated:  International aid/development worker:  Education comprehension:   HOME EXERCISE PROGRAM:   ASSESSMENT:  CLINICAL IMPRESSION:   Pt reports not doing well today and LBP / LE pain is higher. She will call MD , but wants to continue PT until MD f/u.  She was able to continue with therex, STW/ TPR  and IFC and did well again with decreased pain end of session. Able to meet STG#1, but other LTGs are ongoing due to pain.     OBJECTIVE IMPAIRMENTS: Abnormal gait, decreased activity tolerance, difficulty walking, decreased ROM, increased muscle spasms, postural dysfunction, and pain.   ACTIVITY LIMITATIONS: carrying, lifting, bending, standing, and locomotion level  PARTICIPATION LIMITATIONS: meal prep, cleaning, and laundry  PERSONAL FACTORS: Time since onset of injury/illness/exacerbation are also affecting patient's functional  outcome.   REHAB POTENTIAL: Good  CLINICAL DECISION MAKING: Evolving/moderate complexity  EVALUATION COMPLEXITY: Low   GOALS:  LONG TERM GOALS: Target date: 05/21/24  Ind with a HEP. Goal status: MET  2.  Eliminate right LE pain/symptoms. Goal status: On going  3.  Perform ADL's with pain not > 3/10.  Goal status: On going    5-7/10  4.  Walk a community distance with pain not > 3/10.  Goal status: On going  5.  Improve ODI score by at least 8 points.  Goal status: On going  PLAN:  PT FREQUENCY: 2x/week  PT DURATION: 6 weeks  PLANNED INTERVENTIONS: 97110-Therapeutic exercises, 97530- Therapeutic  activity, [id number]- Neuromuscular re-education, 97535- Self Care, 02859- Manual therapy, G0283- Electrical stimulation (unattended), 97035- Ultrasound, 02987- Traction (mechanical), Patient/Family education, and Moist heat.  PLAN FOR NEXT SESSION: Combo e'stim/US , STW/M. IFC, Core exercise progression.  Body mechanics training and spinal protection techniques.     Nikolai Wilczak,CHRIS, PTA 05/18/2024, 12:05 PM  "

## 2024-05-22 ENCOUNTER — Other Ambulatory Visit: Payer: Self-pay | Admitting: Nurse Practitioner

## 2024-05-22 ENCOUNTER — Encounter: Payer: Self-pay | Admitting: *Deleted

## 2024-05-22 ENCOUNTER — Ambulatory Visit: Admitting: *Deleted

## 2024-05-22 DIAGNOSIS — I10 Essential (primary) hypertension: Secondary | ICD-10-CM

## 2024-05-22 DIAGNOSIS — M5459 Other low back pain: Secondary | ICD-10-CM | POA: Diagnosis not present

## 2024-05-22 DIAGNOSIS — R293 Abnormal posture: Secondary | ICD-10-CM

## 2024-05-22 DIAGNOSIS — M62830 Muscle spasm of back: Secondary | ICD-10-CM

## 2024-05-22 NOTE — Therapy (Signed)
 " OUTPATIENT PHYSICAL THERAPY THORACOLUMBAR TREATMENT   Patient Name: Toni Francis MRN: [id number] DOB:October 20, 1955, 69 y.o., female Today's Date: 05/22/2024  END OF SESSION:  PT End of Session - 05/22/24 0948     Visit Number 13    Number of Visits 18    Date for Recertification  06/11/24    PT Start Time 0930    PT Stop Time 1029    PT Time Calculation (min) 59 min          Past Medical History:  Diagnosis Date   Abdominal hernia    Arthritis    GERD (gastroesophageal reflux disease)    Hypertension    Pinched nerve    L4-5,back   Past Surgical History:  Procedure Laterality Date   CHOLECYSTECTOMY     THYROIDECTOMY Right 08/13/2019   Procedure: RIGHT THYROID  LOBECTOMY;  Surgeon: Ethyl Lonni BRAVO, MD;  Location: [facility] OR;  Service: ENT;  Laterality: Right;   Patient Active Problem List   Diagnosis Date Noted   Right leg pain 12/13/2023   Chronic right-sided low back pain with right-sided sciatica 12/13/2023   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  Rationale for Evaluation and Treatment: Rehabilitation  THERAPY DIAG:  Other low back pain  Abnormal posture  Muscle spasm of back  ONSET DATE: Ongoing for LBP, 9/25 for right LE.  SUBJECTIVE:                                                                                                                                                                                           SUBJECTIVE STATEMENT: Pain at a 5/10. Pain keeps coming and going. Called MD and f/u visit in February   PERTINENT HISTORY:  She has seen a Land for years but will put these treatments on hold while receiving PT.  H/o right knee pain.     PAIN:  Are you having pain? Yes: NPRS scale: 5-6/10. Pain location: LB, right LE into posterior/lateral thigh that does not go below level of knee.   Pain description: Ache, throb, shooting.   Aggravating factors: As above.   Relieving factors: As above.    PRECAUTIONS: None  RED FLAGS: None   WEIGHT BEARING RESTRICTIONS: No  FALLS:  Has patient fallen in last 6 months? No  LIVING ENVIRONMENT: Lives with: lives with their family Lives in: House/apartment  Has following equipment at home: None  OCCUPATION: Retired.    PLOF: Independent  PATIENT GOALS: Avoid surgery.    OBJECTIVE:   DIAGNOSTIC FINDINGS:  03/13/24:  IMPRESSION: 1. New left subarticular disc protrusion at L3-L4 resulting in lateral recess narrowing with severe impingement upon the traversing left L4 nerve root. 2. Severe bilateral facet arthrosis at L4-5 with bilateral facet effusions, increased from prior. 3. Additional degenerative changes as above. 4. Right L5-S1 facet synovial cyst measuring 9 mm. No contribution to spinal canal stenosis.  PATIENT SURVEYS:  ODI:  23/50.     POSTURE: rounded shoulders, forward head, and flexed trunk .  Left pelvis higher than right.  Right knee genu valgum.    PALPATION: Tender to palpation with overpressure at L3-S1 and especially tender over the right SIJ.    LUMBAR ROM:   Full active lumbar flexion and extension to 15 degrees.    LOWER EXTREMITY ROM:     WNL.    LOWER EXTREMITY MMT:    WNL.    LUMBAR SPECIAL TESTS:  Equal leg lengths. Some pain reproduction with a right SLR.  No pain with right FABER test.  Diminished Achilles reflexes.    GAIT: Limpage gait over right LE.    TREATMENT DATE:   05/22/24:  Nustep x 15  minutes L4 STW/W and TPR  x 24 minutes to RT LB paras , QL, as well as RT glut and piriformis with patient in left sdly position with pillow between knees for comfort   HMP and IFC at 80-150 Hz on 100% scan x 15 minutes.     04/24/24:   Nustep x 11 mins L5 Pateint in left SDLY position with folded pillow between knees for comfort while receiving  US  combo 1.5 w/cm2 x 7 mins RT SIJ area STW/M x 23 minutes to her right side LB paras, SIJ , glute  and piriformis                                                                                                                       HMP and IFC at 80-150 Hz on 100% scan x 15 minutes to RT side LB and hip  Hooklying  PATIENT EDUCATION:  Education details:  Person educated:  International aid/development worker:  Education comprehension:   HOME EXERCISE PROGRAM:   ASSESSMENT:  CLINICAL IMPRESSION:   Pt reports getting relief after last visit, but hurting again today. She called her MD and will f/u in February and wants to cont. PT until her visit for pain relief..  She was able to continue with therex, STW/ TPR  and IFC and did well again with decreased pain end of session.     OBJECTIVE IMPAIRMENTS: Abnormal gait, decreased activity tolerance, difficulty walking, decreased ROM, increased muscle spasms, postural dysfunction, and pain.   ACTIVITY LIMITATIONS: carrying, lifting, bending, standing, and locomotion level  PARTICIPATION LIMITATIONS: meal prep, cleaning, and laundry  PERSONAL FACTORS: Time since onset of injury/illness/exacerbation are also affecting patient's functional  outcome.   REHAB POTENTIAL: Good  CLINICAL DECISION MAKING: Evolving/moderate complexity  EVALUATION COMPLEXITY: Low   GOALS:  LONG TERM GOALS: Target date: 05/21/24  Ind with a HEP. Goal status: MET  2.  Eliminate right LE pain/symptoms. Goal status: On going  3.  Perform ADL's with pain not > 3/10.  Goal status: On going    5-7/10  4.  Walk a community distance with pain not > 3/10.  Goal status: On going  5.  Improve ODI score by at least 8 points.  Goal status: On going  PLAN:  PT FREQUENCY: 2x/week  PT DURATION: 6 weeks  PLANNED INTERVENTIONS: 97110-Therapeutic  exercises, 97530- Therapeutic activity, [id number]- Neuromuscular re-education, 97535- Self Care, 02859- Manual therapy, G0283- Electrical stimulation (unattended), 97035- Ultrasound, 02987- Traction (mechanical), Patient/Family education, and Moist heat.  PLAN FOR NEXT SESSION: Combo e'stim/US , STW/M. IFC, Core exercise progression.  Body mechanics training and spinal protection techniques.     Toni Francis,CHRIS, PTA 05/22/2024, 10:50 AM  "

## 2024-05-25 ENCOUNTER — Ambulatory Visit: Admitting: *Deleted

## 2024-05-25 ENCOUNTER — Encounter: Payer: Self-pay | Admitting: *Deleted

## 2024-05-25 DIAGNOSIS — M5459 Other low back pain: Secondary | ICD-10-CM

## 2024-05-25 DIAGNOSIS — R293 Abnormal posture: Secondary | ICD-10-CM

## 2024-05-25 DIAGNOSIS — M62830 Muscle spasm of back: Secondary | ICD-10-CM

## 2024-05-28 ENCOUNTER — Ambulatory Visit: Admitting: *Deleted

## 2024-05-28 ENCOUNTER — Ambulatory Visit: Admitting: Physical Therapy

## 2024-05-29 ENCOUNTER — Ambulatory Visit: Admitting: *Deleted

## 2024-05-30 ENCOUNTER — Ambulatory Visit: Admitting: Physical Therapy

## 2024-05-30 DIAGNOSIS — M5459 Other low back pain: Secondary | ICD-10-CM

## 2024-05-30 DIAGNOSIS — R293 Abnormal posture: Secondary | ICD-10-CM

## 2024-05-30 NOTE — Therapy (Signed)
 " OUTPATIENT PHYSICAL THERAPY THORACOLUMBAR TREATMENT   Patient Name: Toni Francis MRN: [id number] DOB:05/22/1955, 69 y.o., female Today's Date: 05/30/2024  END OF SESSION:  PT End of Session - 05/30/24 1404     Visit Number 15    Number of Visits 18    Date for Recertification  06/11/24    PT Start Time 0100    PT Stop Time 0205    PT Time Calculation (min) 65 min    Activity Tolerance Patient tolerated treatment well    Behavior During Therapy [facility] for tasks assessed/performed           Past Medical History:  Diagnosis Date   Abdominal hernia    Arthritis    GERD (gastroesophageal reflux disease)    Hypertension    Pinched nerve    L4-5,back   Past Surgical History:  Procedure Laterality Date   CHOLECYSTECTOMY     THYROIDECTOMY Right 08/13/2019   Procedure: RIGHT THYROID  LOBECTOMY;  Surgeon: Ethyl Lonni BRAVO, MD;  Location: [facility] OR;  Service: ENT;  Laterality: Right;   Patient Active Problem List   Diagnosis Date Noted   Right leg pain 12/13/2023   Chronic right-sided low back pain with right-sided sciatica 12/13/2023   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  Rationale for Evaluation and Treatment: Rehabilitation  THERAPY DIAG:  Other low back pain  Abnormal posture  ONSET DATE: Ongoing for LBP, 9/25 for right LE.  SUBJECTIVE:                                                                                                                                                                                           SUBJECTIVE STATEMENT: Pain at a 2/10. Pain keeps coming and going. MD f/u visit in February yesterday 5-6/10  PERTINENT HISTORY:  She has seen a Land  for years but will put these treatments on hold while receiving PT.  H/o right knee pain.    PAIN:  Are you having pain? Yes: NPRS scale: 5-6/10. Pain location: LB, right LE into posterior/lateral thigh that does not go below level of knee.   Pain description: Ache, throb, shooting.   Aggravating factors: As above.   Relieving factors: As above.    PRECAUTIONS: None  RED FLAGS: None   WEIGHT BEARING RESTRICTIONS: No  FALLS:  Has patient fallen in last 6  months? No  LIVING ENVIRONMENT: Lives with: lives with their family Lives in: House/apartment Has following equipment at home: None  OCCUPATION: Retired.    PLOF: Independent  PATIENT GOALS: Avoid surgery.    OBJECTIVE:   DIAGNOSTIC FINDINGS:  03/13/24:  IMPRESSION: 1. New left subarticular disc protrusion at L3-L4 resulting in lateral recess narrowing with severe impingement upon the traversing left L4 nerve root. 2. Severe bilateral facet arthrosis at L4-5 with bilateral facet effusions, increased from prior. 3. Additional degenerative changes as above. 4. Right L5-S1 facet synovial cyst measuring 9 mm. No contribution to spinal canal stenosis.  PATIENT SURVEYS:  ODI:  23/50.     POSTURE: rounded shoulders, forward head, and flexed trunk .  Left pelvis higher than right.  Right knee genu valgum.    PALPATION: Tender to palpation with overpressure at L3-S1 and especially tender over the right SIJ.    LUMBAR ROM:   Full active lumbar flexion and extension to 15 degrees.    LOWER EXTREMITY ROM:     WNL.    LOWER EXTREMITY MMT:    WNL.    LUMBAR SPECIAL TESTS:  Equal leg lengths. Some pain reproduction with a right SLR.  No pain with right FABER test.  Diminished Achilles reflexes.    GAIT: Limpage gait over right LE.    TREATMENT DATE:   05/30/24:  Nustep level 3 x 12 minutes f/b combo e'stim/US  at 1.50 W/CM2 x 10 minutes to patient's right Piriformis region with patient in left sdly position with  folded pillow between knees for comfort f/b STW/M x 16 minutes f/b HMP and IFC at 80-150 Hz on 100% scan x 20 minutes.  Normal modality response following removal of modality.    05/25/24:   Nustep x 12  minutes L4 STW/W and TPR  x 23 minutes to RT LB paras , QL, as well as RT glut and piriformis with patient in left sdly position with pillow between knees for comfort. US  combo 1.5 w/cm2 x 7 mins to RT piriformis  HMP and IFC at 80-150 Hz on 100% scan x 15 minutes.    04/24/24:   Nustep x 11 mins L5 Pateint in left SDLY position with folded pillow between knees for comfort while receiving  US  combo 1.5 w/cm2 x 7 mins RT SIJ area STW/M x 23 minutes to her right side LB paras, SIJ , glute  and piriformis                                                                                                                       HMP and IFC at 80-150 Hz on 100% scan x 15 minutes to RT side LB and hip  Hooklying  PATIENT EDUCATION:  Education details:  Person educated:  International aid/development worker:  Education comprehension:   HOME EXERCISE PROGRAM:   ASSESSMENT:  CLINICAL IMPRESSION: Patient states that yesterday her back felt good but today her pain is up.  Her CC today was pain in the  regio of her right Piriformis.  She did well with treatment today.     OBJECTIVE IMPAIRMENTS: Abnormal gait, decreased activity tolerance, difficulty walking, decreased ROM, increased muscle spasms, postural dysfunction, and pain.   ACTIVITY LIMITATIONS: carrying, lifting, bending, standing, and locomotion level  PARTICIPATION LIMITATIONS: meal prep, cleaning, and laundry  PERSONAL FACTORS: Time since onset of injury/illness/exacerbation are also affecting patient's functional outcome.   REHAB POTENTIAL: Good  CLINICAL DECISION MAKING: Evolving/moderate complexity  EVALUATION COMPLEXITY: Low   GOALS:  LONG TERM GOALS: Target date: 05/21/24  Ind with a HEP. Goal status: MET  2.  Eliminate right LE  pain/symptoms. Goal status: On going  3.  Perform ADL's with pain not > 3/10.  Goal status: On going            5-7/10  4.  Walk a community distance with pain not > 3/10.  Goal status: On going  5.  Improve ODI score by at least 8 points.  Goal status: On going  PLAN:  PT FREQUENCY: 2x/week  PT DURATION: 6 weeks  PLANNED INTERVENTIONS: 97110-Therapeutic exercises, 97530- Therapeutic activity, [id number]- Neuromuscular re-education, 97535- Self Care, 02859- Manual therapy, G0283- Electrical stimulation (unattended), 97035- Ultrasound, 02987- Traction (mechanical), Patient/Family education, and Moist heat.  PLAN FOR NEXT SESSION: Combo e'stim/US , STW/M. IFC, Core exercise progression.  Body mechanics training and spinal protection techniques.     Eben Choinski, PT 05/30/2024, 2:14 PM  "

## 2024-05-31 ENCOUNTER — Encounter: Payer: Self-pay | Admitting: Nurse Practitioner

## 2024-05-31 ENCOUNTER — Ambulatory Visit (INDEPENDENT_AMBULATORY_CARE_PROVIDER_SITE_OTHER): Payer: Self-pay | Admitting: Nurse Practitioner

## 2024-05-31 VITALS — BP 111/76 | HR 69 | Ht 67.0 in | Wt 255.0 lb

## 2024-05-31 DIAGNOSIS — K219 Gastro-esophageal reflux disease without esophagitis: Secondary | ICD-10-CM

## 2024-05-31 DIAGNOSIS — I10 Essential (primary) hypertension: Secondary | ICD-10-CM

## 2024-05-31 DIAGNOSIS — M5441 Lumbago with sciatica, right side: Secondary | ICD-10-CM

## 2024-05-31 DIAGNOSIS — G8929 Other chronic pain: Secondary | ICD-10-CM

## 2024-05-31 DIAGNOSIS — E890 Postprocedural hypothyroidism: Secondary | ICD-10-CM

## 2024-05-31 MED ORDER — TRIAMTERENE-HCTZ 37.5-25 MG PO TABS: 1.0000 | ORAL_TABLET | Freq: Every day | ORAL | 1 refills | Status: AC

## 2024-05-31 MED ORDER — LEVOTHYROXINE SODIUM 88 MCG PO TABS: 88.0000 ug | ORAL_TABLET | Freq: Every day | ORAL | 1 refills | Status: AC

## 2024-05-31 MED ORDER — OMEPRAZOLE 20 MG PO CPDR: 20.0000 mg | DELAYED_RELEASE_CAPSULE | Freq: Every day | ORAL | 1 refills | Status: AC

## 2024-05-31 NOTE — Patient Instructions (Signed)

## 2024-05-31 NOTE — Progress Notes (Signed)
 "  Subjective:    Patient ID: Toni Francis, female    DOB: 1955/06/28, 69 y.o.   MRN: [id number]   Chief Complaint: medical management of chronic issues     HPI:  Toni Francis is a 69 y.o. who identifies as a female who was assigned female at birth.   Social history: Lives with: dad and brother Work history: retired   Water Engineer in today for follow up of the following chronic medical issues:  1. Primary hypertension No c/o chest pain, sob or headache. Does not check blood pressure at home very often. BP Readings from Last 3 Encounters:  04/06/24 128/82  02/23/24 124/80  01/17/24 139/86      2. Postoperative hypothyroidism No problems that she is aware of. We increased her to at last visit but patient stayed on 75MCG daily. Lab Results  Component Value Date   TSH 7.020 (H) 12/01/2023     3. Gastroesophageal reflux disease without esophagitis Is on omperazole daily and is doing well.  4. Seasonal allergic rhinitis due to pollen Use flonase  and xyzal  daily.   5. chronic low back pain Is seeing a back specialist May have to have surgery Is currently taking PT which may be helping a little.  6. Morbid obesity (HCC) Weight is down 4lbs  Wt Readings from Last 3 Encounters:  05/31/24 255 lb (115.7 kg)  04/06/24 258 lb (117 kg)  02/23/24 259 lb (117.5 kg)   BMI Readings from Last 3 Encounters:  05/31/24 39.94 kg/m  04/06/24 40.41 kg/m  02/23/24 40.57 kg/m        New complaints: None today  Allergies  Allergen Reactions   Ampicillin Rash    Did it involve swelling of the face/tongue/throat, SOB, or low BP? No Did it involve sudden or severe rash/hives, skin peeling, or any reaction on the inside of your mouth or nose? Yes Did you need to seek medical attention at a hospital or doctor's office? Yes When did it last happen?      10 + years If all above answers are NO, may proceed with cephalosporin use.    Codeine Nausea And Vomiting and Other  (See Comments)    Also causes headaches    Pseudoephedrine  Palpitations   Outpatient Encounter Medications as of 05/31/2024  Medication Sig   acetaminophen  (TYLENOL ) 500 MG tablet Take 1,000 mg by mouth daily as needed for moderate pain or headache.   azithromycin  (ZITHROMAX  Z-PAK) 250 MG tablet As directed   CALCIUM  PO Take by mouth.   cefdinir  (OMNICEF ) 300 MG capsule Take 1 capsule (300 mg total) by mouth 2 (two) times daily. 1 po BID   Cholecalciferol (VITAMIN D3) 25 MCG (1000 UT) CAPS Take 2,000 Units by mouth daily.   cyclobenzaprine  (FLEXERIL ) 10 MG tablet Take 1 tablet (10 mg total) by mouth 3 (three) times daily as needed for muscle spasms.   fluticasone  (FLONASE ) 50 MCG/ACT nasal spray USE 2 SPRAY(S) IN EACH NOSTRIL ONCE TO TWICE DAILY   levocetirizine (XYZAL ) 5 MG tablet TAKE 1 TABLET BY MOUTH ONCE DAILY IN THE EVENING   levothyroxine  (SYNTHROID ) 75 MCG tablet Take 1 tablet (75 mcg total) by mouth daily.   levothyroxine  (SYNTHROID ) 88 MCG tablet Take 1 tablet (88 mcg total) by mouth daily.   meloxicam  (MOBIC ) 15 MG tablet Take 15 mg by mouth daily.   Multiple Vitamins-Minerals (CENTRUM SILVER PO) Take 1 tablet by mouth daily.   Multiple Vitamins-Minerals (EYE VITAMINS PO) Take 1 capsule  by mouth daily.   Nutritional Supplements (VITAMIN D  BOOSTER PO) Take by mouth.   Omega-3 Fatty Acids (FISH OIL PO) Take 1 capsule by mouth daily.    omeprazole  (PRILOSEC) 20 MG capsule Take 1 capsule (20 mg total) by mouth daily.   triamcinolone  (KENALOG ) 0.025 % cream Apply 1 Application topically 2 (two) times daily.   triamterene -hydrochlorothiazide  (MAXZIDE -25) 37.5-25 MG tablet Take 1 tablet by mouth once daily   ZINC  OXIDE PO Take by mouth.   Facility-Administered Encounter Medications as of 05/31/2024  Medication   promethazine  (PHENERGAN ) injection 25 mg    Past Surgical History:  Procedure Laterality Date   CHOLECYSTECTOMY     THYROIDECTOMY Right 08/13/2019   Procedure: RIGHT  THYROID  LOBECTOMY;  Surgeon: Ethyl Lonni BRAVO, MD;  Location: [facility] OR;  Service: ENT;  Laterality: Right;    Family History  Problem Relation Age of Onset   Colon cancer Maternal Uncle 50   Rectal cancer Neg Hx    Stomach cancer Neg Hx       Controlled substance contract: n/a     Review of Systems  Constitutional:  Negative for diaphoresis.  Eyes:  Negative for pain.  Respiratory:  Negative for shortness of breath.   Cardiovascular:  Negative for chest pain, palpitations and leg swelling.  Gastrointestinal:  Negative for abdominal pain.  Endocrine: Negative for polydipsia.  Skin:  Negative for rash.  Neurological:  Negative for dizziness, weakness and headaches.  Hematological:  Does not bruise/bleed easily.  All other systems reviewed and are negative.      Objective:   Physical Exam Vitals and nursing note reviewed.  Constitutional:      General: She is not in acute distress.    Appearance: Normal appearance. She is well-developed.  HENT:     Head: Normocephalic.     Right Ear: Tympanic membrane normal.     Left Ear: Tympanic membrane normal.     Nose: Nose normal.     Mouth/Throat:     Mouth: Mucous membranes are moist.  Eyes:     Pupils: Pupils are equal, round, and reactive to light.  Neck:     Vascular: No carotid bruit or JVD.  Cardiovascular:     Rate and Rhythm: Normal rate and regular rhythm.     Heart sounds: Normal heart sounds.  Pulmonary:     Effort: Pulmonary effort is normal. No respiratory distress.     Breath sounds: Normal breath sounds. No wheezing or rales.  Chest:     Chest wall: No tenderness.  Abdominal:     General: Bowel sounds are normal. There is no distension or abdominal bruit.     Palpations: Abdomen is soft. There is no hepatomegaly, splenomegaly, mass or pulsatile mass.     Tenderness: There is no abdominal tenderness.  Musculoskeletal:        General: Normal range of motion.     Cervical back: Normal range of motion  and neck supple.     Comments: Gait slow and steady  Lymphadenopathy:     Cervical: No cervical adenopathy.  Skin:    General: Skin is warm and dry.  Neurological:     Mental Status: She is alert and oriented to person, place, and time.     Deep Tendon Reflexes: Reflexes are normal and symmetric.  Psychiatric:        Behavior: Behavior normal.        Thought Content: Thought content normal.  Judgment: Judgment normal.     BP 111/76   Pulse 69   Ht 5' 7 (1.702 m)   Wt 255 lb (115.7 kg)   SpO2 95%   BMI 39.94 kg/m          Assessment & Plan:   JELITZA MANNINEN comes in today with chief complaint of medical management of chronic issues    Diagnosis and orders addressed:  1. Primary hypertension Low sodium diet - triamterene -hydrochlorothiazide  (MAXZIDE -25) 37.5-25 MG tablet; Take 1 tablet by mouth daily. For blood pressure and fluid  Dispense: 90 tablet; Refill: 1 - CBC with Differential/Platelet - CMP14+EGFR - Lipid panel  2. Postoperative hypothyroidism Labs oending - levothyroxine  (SYNTHROID ) 75 MCG tablet; Take 1 tablet (75 mcg total) by mouth daily.  Dispense: 90 tablet; Refill: 1 - Thyroid  Panel With TSH  3. Gastroesophageal reflux disease without esophagitis Avoid spicy foods Do not eat 2 hours prior to bedtime - omeprazole  (PRILOSEC) 20 MG capsule; Take 1 capsule (20 mg total) by mouth daily.  Dispense: 90 capsule; Refill: 1  4. Seasonal allergic rhinitis due to pollen Continue OTC meds  5. Morbid obesity (HCC) Discussed diet and exercise for person with BMI >25 Will recheck weight in 3-6 months  6. Hx uti Results pending  7. Chronic back pain with sciatica Toradol  and prednisone  Back stretches  Labs pending Health Maintenance reviewed Diet and exercise encouraged  Follow up plan: 6 months   Toni Gladis, FNP  "

## 2024-06-01 ENCOUNTER — Ambulatory Visit: Admitting: *Deleted

## 2024-06-01 ENCOUNTER — Ambulatory Visit: Payer: Self-pay | Admitting: Nurse Practitioner

## 2024-06-01 DIAGNOSIS — M62830 Muscle spasm of back: Secondary | ICD-10-CM

## 2024-06-01 DIAGNOSIS — R293 Abnormal posture: Secondary | ICD-10-CM

## 2024-06-01 DIAGNOSIS — M5459 Other low back pain: Secondary | ICD-10-CM

## 2024-06-01 LAB — CMP14+EGFR
ALT: 23 [IU]/L (ref 0–32)
AST: 23 [IU]/L (ref 0–40)
Albumin: 4.4 g/dL (ref 3.9–4.9)
Alkaline Phosphatase: 88 [IU]/L (ref 49–135)
BUN/Creatinine Ratio: 17 (ref 12–28)
BUN: 19 mg/dL (ref 8–27)
Bilirubin Total: 0.6 mg/dL (ref 0.0–1.2)
CO2: 22 mmol/L (ref 20–29)
Calcium: 10.1 mg/dL (ref 8.7–10.3)
Chloride: 100 mmol/L (ref 96–106)
Creatinine, Ser: 1.15 mg/dL — ABNORMAL HIGH (ref 0.57–1.00)
Globulin, Total: 2.2 g/dL (ref 1.5–4.5)
Glucose: 92 mg/dL (ref 70–99)
Potassium: 4.2 mmol/L (ref 3.5–5.2)
Sodium: 140 mmol/L (ref 134–144)
Total Protein: 6.6 g/dL (ref 6.0–8.5)
eGFR: 52 mL/min/{1.73_m2} — ABNORMAL LOW

## 2024-06-01 LAB — CBC WITH DIFFERENTIAL/PLATELET
Basophils Absolute: 0.1 10*3/uL (ref 0.0–0.2)
Basos: 1 %
EOS (ABSOLUTE): 0.1 10*3/uL (ref 0.0–0.4)
Eos: 1 %
Hematocrit: 42.1 % (ref 34.0–46.6)
Hemoglobin: 14.1 g/dL (ref 11.1–15.9)
Immature Grans (Abs): 0 10*3/uL (ref 0.0–0.1)
Immature Granulocytes: 0 %
Lymphocytes Absolute: 1.3 10*3/uL (ref 0.7–3.1)
Lymphs: 16 %
MCH: 29.9 pg (ref 26.6–33.0)
MCHC: 33.5 g/dL (ref 31.5–35.7)
MCV: 89 fL (ref 79–97)
Monocytes Absolute: 0.7 10*3/uL (ref 0.1–0.9)
Monocytes: 8 %
Neutrophils Absolute: 5.7 10*3/uL (ref 1.4–7.0)
Neutrophils: 74 %
Platelets: 285 10*3/uL (ref 150–450)
RBC: 4.71 x10E6/uL (ref 3.77–5.28)
RDW: 11.9 % (ref 11.7–15.4)
WBC: 7.9 10*3/uL (ref 3.4–10.8)

## 2024-06-01 LAB — LIPID PANEL
Chol/HDL Ratio: 3.4 ratio (ref 0.0–4.4)
Cholesterol, Total: 180 mg/dL (ref 100–199)
HDL: 53 mg/dL
LDL Chol Calc (NIH): 101 mg/dL — ABNORMAL HIGH (ref 0–99)
Triglycerides: 148 mg/dL (ref 0–149)
VLDL Cholesterol Cal: 26 mg/dL (ref 5–40)

## 2024-06-01 LAB — TSH: TSH: 4.73 u[IU]/mL — AB (ref 0.450–4.500)

## 2024-06-01 NOTE — Therapy (Addendum)
 " OUTPATIENT PHYSICAL THERAPY THORACOLUMBAR TREATMENT   Patient Name: Toni Francis MRN: [id number] DOB:Nov 18, 1955, 69 y.o., female Today's Date: 06/01/2024  END OF SESSION:  PT End of Session - 06/01/24 1135     Visit Number 16    Number of Visits 18    Date for Recertification  06/11/24    PT Start Time 0930    PT Stop Time 1029    PT Time Calculation (min) 59 min           Past Medical History:  Diagnosis Date   Abdominal hernia    Arthritis    GERD (gastroesophageal reflux disease)    Hypertension    Pinched nerve    L4-5,back   Past Surgical History:  Procedure Laterality Date   CHOLECYSTECTOMY     THYROIDECTOMY Right 08/13/2019   Procedure: RIGHT THYROID  LOBECTOMY;  Surgeon: Ethyl Lonni BRAVO, MD;  Location: [facility] OR;  Service: ENT;  Laterality: Right;   Patient Active Problem List   Diagnosis Date Noted   Right leg pain 12/13/2023   Chronic right-sided low back pain with right-sided sciatica 12/13/2023   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  Rationale for Evaluation and Treatment: Rehabilitation  THERAPY DIAG:  Other low back pain  Abnormal posture  Muscle spasm of back  ONSET DATE: Ongoing for LBP, 9/25 for right LE.  SUBJECTIVE:                                                                                                                                                                                           SUBJECTIVE STATEMENT: Pain at a 2/10. Pain keeps coming and going. MD f/u visit in February yesterday 5-6/10  PERTINENT HISTORY:  She has seen a Land for years but will put these treatments on hold while receiving PT.  H/o right knee pain.     PAIN:  Are you having pain? Yes: NPRS scale: 5-6/10. Pain location: LB, right LE into posterior/lateral thigh that does not go below level of knee.   Pain description: Ache, throb, shooting.   Aggravating factors: As above.   Relieving factors: As above.    PRECAUTIONS: None  RED FLAGS: None   WEIGHT BEARING RESTRICTIONS: No  FALLS:  Has patient fallen in last 6 months? No  LIVING ENVIRONMENT: Lives with: lives with their family Lives in: House/apartment  Has following equipment at home: None  OCCUPATION: Retired.    PLOF: Independent  PATIENT GOALS: Avoid surgery.    OBJECTIVE:   DIAGNOSTIC FINDINGS:  03/13/24:  IMPRESSION: 1. New left subarticular disc protrusion at L3-L4 resulting in lateral recess narrowing with severe impingement upon the traversing left L4 nerve root. 2. Severe bilateral facet arthrosis at L4-5 with bilateral facet effusions, increased from prior. 3. Additional degenerative changes as above. 4. Right L5-S1 facet synovial cyst measuring 9 mm. No contribution to spinal canal stenosis.  PATIENT SURVEYS:  ODI:  23/50.     POSTURE: rounded shoulders, forward head, and flexed trunk .  Left pelvis higher than right.  Right knee genu valgum.    PALPATION: Tender to palpation with overpressure at L3-S1 and especially tender over the right SIJ.    LUMBAR ROM:   Full active lumbar flexion and extension to 15 degrees.    LOWER EXTREMITY ROM:     WNL.    LOWER EXTREMITY MMT:    WNL.    LUMBAR SPECIAL TESTS:  Equal leg lengths. Some pain reproduction with a right SLR.  No pain with right FABER test.  Diminished Achilles reflexes.    GAIT: Limpage gait over right LE.    TREATMENT DATE:   06/01/24:   Nustep level 3 x 12 minutes   combo e'stim/US  at 1.50 W/CM2 x 7 minutes to patient's right Piriformis region with patient in left sdly position with folded pillow between knees for comfort   STW/M and TPR x 20 minutes to LB paras and  piriformis  HMP and IFC at 80-150 Hz on 100% scan x 15 minutes.  Normal modality response following removal of modality.    05/25/24:   Nustep x 12  minutes L4 STW/W and TPR  x 23 minutes to RT LB paras , QL, as well as RT glut and piriformis with patient in left sdly position with pillow between knees for comfort. US  combo 1.5 w/cm2 x 7 mins to RT piriformis  HMP and IFC at 80-150 Hz on 100% scan x 15 minutes.    04/24/24:   Nustep x 11 mins L5 Pateint in left SDLY position with folded pillow between knees for comfort while receiving  US  combo 1.5 w/cm2 x 7 mins RT SIJ area STW/M x 23 minutes to her right side LB paras, SIJ , glute  and piriformis                                                                                                                       HMP and IFC at 80-150 Hz on 100% scan x 15 minutes to RT side LB and hip  Hooklying  PATIENT EDUCATION:  Education details:  Person educated:  International aid/development worker:  Education comprehension:   HOME EXERCISE PROGRAM:   ASSESSMENT:  CLINICAL IMPRESSION: Patient states that she continues to feel better after PT visits, but pain comes back. She was able to continue with therex, US  combo, STW as  well as IFC/HP and did well. Decreased tenderness in piriformis today.      OBJECTIVE IMPAIRMENTS: Abnormal gait, decreased activity tolerance, difficulty walking, decreased ROM, increased muscle spasms, postural dysfunction, and pain.   ACTIVITY LIMITATIONS: carrying, lifting, bending, standing, and locomotion level  PARTICIPATION LIMITATIONS: meal prep, cleaning, and laundry  PERSONAL FACTORS: Time since onset of injury/illness/exacerbation are also affecting patient's functional outcome.   REHAB POTENTIAL: Good  CLINICAL DECISION MAKING: Evolving/moderate complexity  EVALUATION COMPLEXITY: Low   GOALS:  LONG TERM GOALS: Target date: 05/21/24  Ind with a HEP. Goal status: MET  2.  Eliminate right LE pain/symptoms. Goal  status: On going  3.  Perform ADL's with pain not > 3/10.  Goal status: On going            5-7/10  4.  Walk a community distance with pain not > 3/10.  Goal status: On going  5.  Improve ODI score by at least 8 points.  Goal status: On going  PLAN:  PT FREQUENCY: 2x/week  PT DURATION: 6 weeks  PLANNED INTERVENTIONS: 97110-Therapeutic exercises, 97530- Therapeutic activity, [id number]- Neuromuscular re-education, 97535- Self Care, [id number]- Manual therapy, [id number]- Electrical stimulation (unattended), 97035- Ultrasound, [id number]- Traction (mechanical), Patient/Family education, and Moist heat.  PLAN FOR NEXT SESSION: Combo e'stim/US , STW/M. IFC, Core exercise progression.  Body mechanics training and spinal protection techniques.     Ammy Lienhard,CHRIS, PTA 06/01/2024, 11:51 AM  "

## 2024-06-04 ENCOUNTER — Ambulatory Visit: Admitting: *Deleted

## 2024-06-08 ENCOUNTER — Ambulatory Visit: Admitting: *Deleted

## 2024-06-12 ENCOUNTER — Ambulatory Visit: Admitting: *Deleted

## 2024-06-15 ENCOUNTER — Ambulatory Visit: Admitting: Physical Therapy

## 2024-08-10 ENCOUNTER — Ambulatory Visit: Payer: Self-pay

## 2024-11-27 ENCOUNTER — Ambulatory Visit: Admitting: Nurse Practitioner
